# Patient Record
Sex: Male | Born: 1945 | Race: White | Hispanic: No | Marital: Married | Smoking: Former smoker
Health system: Southern US, Community
[De-identification: ages and names within clinical notes are randomized; demographics above are authoritative.]

## PROBLEM LIST (undated history)

## (undated) DIAGNOSIS — C678 Malignant neoplasm of overlapping sites of bladder: Secondary | ICD-10-CM

## (undated) DIAGNOSIS — E785 Hyperlipidemia, unspecified: Secondary | ICD-10-CM

## (undated) DIAGNOSIS — I82409 Acute embolism and thrombosis of unspecified deep veins of unspecified lower extremity: Secondary | ICD-10-CM

## (undated) DIAGNOSIS — R350 Frequency of micturition: Secondary | ICD-10-CM

## (undated) DIAGNOSIS — G5603 Carpal tunnel syndrome, bilateral upper limbs: Secondary | ICD-10-CM

## (undated) DIAGNOSIS — E7800 Pure hypercholesterolemia, unspecified: Secondary | ICD-10-CM

## (undated) DIAGNOSIS — R351 Nocturia: Secondary | ICD-10-CM

## (undated) DIAGNOSIS — M170 Bilateral primary osteoarthritis of knee: Secondary | ICD-10-CM

## (undated) DIAGNOSIS — M51369 Other intervertebral disc degeneration, lumbar region without mention of lumbar back pain or lower extremity pain: Secondary | ICD-10-CM

## (undated) DIAGNOSIS — I2699 Other pulmonary embolism without acute cor pulmonale: Secondary | ICD-10-CM

## (undated) DIAGNOSIS — K219 Gastro-esophageal reflux disease without esophagitis: Secondary | ICD-10-CM

## (undated) DIAGNOSIS — Z87828 Personal history of other (healed) physical injury and trauma: Secondary | ICD-10-CM

## (undated) DIAGNOSIS — E119 Type 2 diabetes mellitus without complications: Secondary | ICD-10-CM

## (undated) DIAGNOSIS — R911 Solitary pulmonary nodule: Secondary | ICD-10-CM

## (undated) DIAGNOSIS — I749 Embolism and thrombosis of unspecified artery: Secondary | ICD-10-CM

## (undated) DIAGNOSIS — M1990 Unspecified osteoarthritis, unspecified site: Secondary | ICD-10-CM

## (undated) DIAGNOSIS — M5136 Other intervertebral disc degeneration, lumbar region: Secondary | ICD-10-CM

## (undated) DIAGNOSIS — N3289 Other specified disorders of bladder: Secondary | ICD-10-CM

## (undated) DIAGNOSIS — Z9289 Personal history of other medical treatment: Secondary | ICD-10-CM

## (undated) DIAGNOSIS — I2510 Atherosclerotic heart disease of native coronary artery without angina pectoris: Secondary | ICD-10-CM

## (undated) DIAGNOSIS — C801 Malignant (primary) neoplasm, unspecified: Secondary | ICD-10-CM

## (undated) DIAGNOSIS — I10 Essential (primary) hypertension: Secondary | ICD-10-CM

## (undated) HISTORY — DX: Malignant neoplasm of overlapping sites of bladder: C67.8

## (undated) HISTORY — DX: Carpal tunnel syndrome, bilateral upper limbs: G56.03

## (undated) HISTORY — DX: Solitary pulmonary nodule: R91.1

## (undated) HISTORY — PX: TONSILLECTOMY: SUR1361

## (undated) HISTORY — DX: Atherosclerotic heart disease of native coronary artery without angina pectoris: I25.10

## (undated) HISTORY — PX: IVC FILTER PLACEMENT (ARMC HX): HXRAD1551

## (undated) HISTORY — DX: Other intervertebral disc degeneration, lumbar region: M51.36

## (undated) HISTORY — DX: Pure hypercholesterolemia, unspecified: E78.00

## (undated) HISTORY — DX: Other intervertebral disc degeneration, lumbar region without mention of lumbar back pain or lower extremity pain: M51.369

## (undated) HISTORY — DX: Essential (primary) hypertension: I10

## (undated) HISTORY — DX: Bilateral primary osteoarthritis of knee: M17.0

## (undated) HISTORY — PX: CARDIAC CATHETERIZATION: SHX172

---

## 1963-05-31 HISTORY — PX: OTHER SURGICAL HISTORY: SHX169

## 1991-05-31 HISTORY — PX: CATARACT EXTRACTION W/ INTRAOCULAR LENS  IMPLANT, BILATERAL: SHX1307

## 2009-05-30 HISTORY — PX: CARPAL TUNNEL RELEASE: SHX101

## 2009-05-30 HISTORY — PX: CHOLECYSTECTOMY: SHX55

## 2014-02-04 ENCOUNTER — Ambulatory Visit (INDEPENDENT_AMBULATORY_CARE_PROVIDER_SITE_OTHER): Payer: Medicare Other | Admitting: Podiatry

## 2014-02-04 ENCOUNTER — Ambulatory Visit (INDEPENDENT_AMBULATORY_CARE_PROVIDER_SITE_OTHER): Payer: Medicare Other

## 2014-02-04 ENCOUNTER — Encounter: Payer: Self-pay | Admitting: Podiatry

## 2014-02-04 VITALS — BP 145/78 | HR 60 | Resp 16 | Ht 72.0 in | Wt 190.0 lb

## 2014-02-04 DIAGNOSIS — M21619 Bunion of unspecified foot: Secondary | ICD-10-CM

## 2014-02-04 DIAGNOSIS — M2010 Hallux valgus (acquired), unspecified foot: Secondary | ICD-10-CM

## 2014-02-04 DIAGNOSIS — M79609 Pain in unspecified limb: Secondary | ICD-10-CM

## 2014-02-04 DIAGNOSIS — B351 Tinea unguium: Secondary | ICD-10-CM

## 2014-02-04 DIAGNOSIS — E119 Type 2 diabetes mellitus without complications: Secondary | ICD-10-CM

## 2014-02-04 NOTE — Progress Notes (Signed)
   Subjective:    Patient ID: Reymond Maynez, male    DOB: [birth year], 68 y.o.   MRN: [id number]  HPI Comments: Diabetic foot exam. No problems with the feet, diabetic for at least 15 years. The last a1c 7.   Diabetes      Review of Systems  Endocrine:       Diabetes   All other systems reviewed and are negative.      Objective:   Physical Exam: I have reviewed his past medical history medications allergies surgeries social history and review of systems. Pulses are strongly palpable bilateral. Neurologic sensorium is intact per since once the monofilament. Deep tendon reflexes are intact bilateral muscle strength is 5 over 5 dorsiflexors plantar flexors inverters everters all intrinsic musculature is intact. Orthopedic evaluation demonstrates all joints distal to the ankle a full range of motion without crepitation. Cavus foot deformity with hammertoe deformities noted. Cutaneous evaluation demonstrates thicker dystrophic nails possibly mycotic bilateral.        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  Plan: Debridement of nails 1 through 5 bilateral covered service secondary to pain.

## 2014-06-16 DIAGNOSIS — E119 Type 2 diabetes mellitus without complications: Secondary | ICD-10-CM | POA: Diagnosis not present

## 2014-06-19 DIAGNOSIS — E119 Type 2 diabetes mellitus without complications: Secondary | ICD-10-CM | POA: Diagnosis not present

## 2014-07-17 DIAGNOSIS — M79675 Pain in left toe(s): Secondary | ICD-10-CM | POA: Diagnosis not present

## 2014-07-17 DIAGNOSIS — L600 Ingrowing nail: Secondary | ICD-10-CM | POA: Diagnosis not present

## 2014-07-17 DIAGNOSIS — M2041 Other hammer toe(s) (acquired), right foot: Secondary | ICD-10-CM | POA: Diagnosis not present

## 2014-07-17 DIAGNOSIS — M79674 Pain in right toe(s): Secondary | ICD-10-CM | POA: Diagnosis not present

## 2014-09-30 DIAGNOSIS — M79675 Pain in left toe(s): Secondary | ICD-10-CM | POA: Diagnosis not present

## 2014-09-30 DIAGNOSIS — M2041 Other hammer toe(s) (acquired), right foot: Secondary | ICD-10-CM | POA: Diagnosis not present

## 2014-09-30 DIAGNOSIS — M79674 Pain in right toe(s): Secondary | ICD-10-CM | POA: Diagnosis not present

## 2014-09-30 DIAGNOSIS — L600 Ingrowing nail: Secondary | ICD-10-CM | POA: Diagnosis not present

## 2015-01-15 ENCOUNTER — Encounter: Payer: Self-pay | Admitting: Podiatry

## 2015-01-15 ENCOUNTER — Ambulatory Visit (INDEPENDENT_AMBULATORY_CARE_PROVIDER_SITE_OTHER): Payer: Medicare Other | Admitting: Podiatry

## 2015-01-15 DIAGNOSIS — B351 Tinea unguium: Secondary | ICD-10-CM

## 2015-01-15 DIAGNOSIS — E1342 Other specified diabetes mellitus with diabetic polyneuropathy: Secondary | ICD-10-CM

## 2015-01-15 DIAGNOSIS — G629 Polyneuropathy, unspecified: Secondary | ICD-10-CM

## 2015-01-15 DIAGNOSIS — E1142 Type 2 diabetes mellitus with diabetic polyneuropathy: Secondary | ICD-10-CM

## 2015-01-15 DIAGNOSIS — M79609 Pain in unspecified limb: Secondary | ICD-10-CM | POA: Diagnosis not present

## 2015-01-15 LAB — HM DIABETES FOOT EXAM

## 2015-01-15 NOTE — Progress Notes (Signed)
He presents today with a chief complaint of painful elongated toenails and he is concerned because he is a diabetic. He denies any changes in his past medical history medications allergies or social history. He denies any other troubles with his feet.  Objective: vital signs are stable he is alert and oriented 3. Pulses are strongly palpable. Nails are elongated and dystrophic. They're painful on palpation.  Assessment: pain in limb secondary to long painful thickened nails.  Plan: debrided nails 1 through 5 bilateral is a covered service secondary to pain  And diabetes. Follow-up with him in 3 months.

## 2015-03-03 DIAGNOSIS — L570 Actinic keratosis: Secondary | ICD-10-CM | POA: Diagnosis not present

## 2015-03-03 DIAGNOSIS — D692 Other nonthrombocytopenic purpura: Secondary | ICD-10-CM | POA: Diagnosis not present

## 2015-03-03 DIAGNOSIS — L219 Seborrheic dermatitis, unspecified: Secondary | ICD-10-CM | POA: Diagnosis not present

## 2015-03-11 DIAGNOSIS — Z23 Encounter for immunization: Secondary | ICD-10-CM | POA: Diagnosis not present

## 2015-04-08 ENCOUNTER — Ambulatory Visit: Payer: Medicare Other | Admitting: Podiatry

## 2015-04-09 ENCOUNTER — Ambulatory Visit (INDEPENDENT_AMBULATORY_CARE_PROVIDER_SITE_OTHER): Payer: Medicare Other | Admitting: Podiatry

## 2015-04-09 ENCOUNTER — Encounter: Payer: Self-pay | Admitting: Podiatry

## 2015-04-09 DIAGNOSIS — M79609 Pain in unspecified limb: Secondary | ICD-10-CM | POA: Diagnosis not present

## 2015-04-09 DIAGNOSIS — E1142 Type 2 diabetes mellitus with diabetic polyneuropathy: Secondary | ICD-10-CM | POA: Diagnosis not present

## 2015-04-09 DIAGNOSIS — B351 Tinea unguium: Secondary | ICD-10-CM

## 2015-04-09 NOTE — Progress Notes (Signed)
Patient ID: [name], male   DOB: August 05, 1945, 69 y.o.   MRN: [id number] Complaint:  Visit Type: Patient returns to my office for continued preventative foot care services. Complaint: Patient states" my nails have grown long and thick and become painful to walk and wear shoes" Patient has been diagnosed with DM with no foot complications. The patient presents for preventative foot care services. No changes to ROS  Podiatric Exam: Vascular: dorsalis pedis and posterior tibial pulses are palpable bilateral. Capillary return is immediate. Temperature gradient is WNL. Skin turgor WNL  Sensorium: Normal Semmes Weinstein monofilament test. Normal tactile sensation bilaterally. Nail Exam: Pt has thick disfigured discolored nails with subungual debris noted bilateral entire nail hallux through fifth toenails Ulcer Exam: There is no evidence of ulcer or pre-ulcerative changes or infection. Orthopedic Exam: Muscle tone and strength are WNL. No limitations in general ROM. No crepitus or effusions noted. Foot type and digits show no abnormalities. Bony prominences are unremarkable. Skin: No Porokeratosis. No infection or ulcers  Diagnosis:  Onychomycosis, , Pain in right toe, pain in left toes  Treatment & Plan Procedures and Treatment: Consent by patient was obtained for treatment procedures. The patient understood the discussion of treatment and procedures well. All questions were answered thoroughly reviewed. Debridement of mycotic and hypertrophic toenails, 1 through 5 bilateral and clearing of subungual debris. No ulceration, no infection noted.  Return Visit-Office Procedure: Patient instructed to return to the office for a follow up visit 3 months for continued evaluation and treatment.

## 2015-05-06 DIAGNOSIS — H11053 Peripheral pterygium, progressive, bilateral: Secondary | ICD-10-CM | POA: Diagnosis not present

## 2015-05-06 DIAGNOSIS — Z961 Presence of intraocular lens: Secondary | ICD-10-CM | POA: Diagnosis not present

## 2015-05-06 DIAGNOSIS — E119 Type 2 diabetes mellitus without complications: Secondary | ICD-10-CM | POA: Diagnosis not present

## 2015-05-06 DIAGNOSIS — Z7984 Long term (current) use of oral hypoglycemic drugs: Secondary | ICD-10-CM | POA: Diagnosis not present

## 2015-06-24 DIAGNOSIS — E119 Type 2 diabetes mellitus without complications: Secondary | ICD-10-CM | POA: Diagnosis not present

## 2015-06-24 DIAGNOSIS — Z125 Encounter for screening for malignant neoplasm of prostate: Secondary | ICD-10-CM | POA: Diagnosis not present

## 2015-07-01 DIAGNOSIS — L600 Ingrowing nail: Secondary | ICD-10-CM | POA: Diagnosis not present

## 2015-07-01 DIAGNOSIS — M79675 Pain in left toe(s): Secondary | ICD-10-CM | POA: Diagnosis not present

## 2015-07-01 DIAGNOSIS — M2041 Other hammer toe(s) (acquired), right foot: Secondary | ICD-10-CM | POA: Diagnosis not present

## 2015-07-01 DIAGNOSIS — M79674 Pain in right toe(s): Secondary | ICD-10-CM | POA: Diagnosis not present

## 2015-07-02 DIAGNOSIS — Z1212 Encounter for screening for malignant neoplasm of rectum: Secondary | ICD-10-CM | POA: Diagnosis not present

## 2015-07-16 DIAGNOSIS — M5416 Radiculopathy, lumbar region: Secondary | ICD-10-CM | POA: Diagnosis not present

## 2015-07-16 DIAGNOSIS — E785 Hyperlipidemia, unspecified: Secondary | ICD-10-CM | POA: Diagnosis not present

## 2015-07-16 DIAGNOSIS — E119 Type 2 diabetes mellitus without complications: Secondary | ICD-10-CM | POA: Diagnosis not present

## 2015-07-16 DIAGNOSIS — R310 Gross hematuria: Secondary | ICD-10-CM | POA: Diagnosis not present

## 2015-07-16 DIAGNOSIS — Z125 Encounter for screening for malignant neoplasm of prostate: Secondary | ICD-10-CM | POA: Diagnosis not present

## 2015-07-16 DIAGNOSIS — Z0000 Encounter for general adult medical examination without abnormal findings: Secondary | ICD-10-CM | POA: Diagnosis not present

## 2015-07-22 DIAGNOSIS — R310 Gross hematuria: Secondary | ICD-10-CM | POA: Diagnosis not present

## 2015-07-30 DIAGNOSIS — C678 Malignant neoplasm of overlapping sites of bladder: Secondary | ICD-10-CM | POA: Diagnosis not present

## 2015-07-30 DIAGNOSIS — R911 Solitary pulmonary nodule: Secondary | ICD-10-CM | POA: Diagnosis not present

## 2015-08-14 DIAGNOSIS — Z0000 Encounter for general adult medical examination without abnormal findings: Secondary | ICD-10-CM | POA: Diagnosis not present

## 2015-08-14 DIAGNOSIS — N3289 Other specified disorders of bladder: Secondary | ICD-10-CM | POA: Diagnosis not present

## 2015-08-14 DIAGNOSIS — R310 Gross hematuria: Secondary | ICD-10-CM | POA: Diagnosis not present

## 2015-08-19 ENCOUNTER — Other Ambulatory Visit: Payer: Self-pay | Admitting: Urology

## 2015-08-24 ENCOUNTER — Encounter (HOSPITAL_BASED_OUTPATIENT_CLINIC_OR_DEPARTMENT_OTHER): Payer: Self-pay | Admitting: *Deleted

## 2015-08-24 NOTE — Progress Notes (Signed)
NPO AFTER MN.  ARRIVE AT 0700.  NEEDS ISTAT AND EKG.  WILL TAKE PRILOSEC AM DOS W/ SIPS OF WATER.

## 2015-08-27 ENCOUNTER — Ambulatory Visit (HOSPITAL_BASED_OUTPATIENT_CLINIC_OR_DEPARTMENT_OTHER)
Admission: RE | Admit: 2015-08-27 | Discharge: 2015-08-27 | Disposition: A | Discharge status: Home / Self Care | Payer: Medicare Other | Source: Ambulatory Visit | Attending: Urology | Admitting: Urology

## 2015-08-27 ENCOUNTER — Encounter (HOSPITAL_BASED_OUTPATIENT_CLINIC_OR_DEPARTMENT_OTHER)
Admission: RE | Disposition: A | Discharge status: Home / Self Care | Payer: Self-pay | Source: Ambulatory Visit | Attending: Urology

## 2015-08-27 ENCOUNTER — Encounter (HOSPITAL_BASED_OUTPATIENT_CLINIC_OR_DEPARTMENT_OTHER): Payer: Self-pay

## 2015-08-27 ENCOUNTER — Ambulatory Visit (HOSPITAL_BASED_OUTPATIENT_CLINIC_OR_DEPARTMENT_OTHER): Payer: Medicare Other | Admitting: Anesthesiology

## 2015-08-27 DIAGNOSIS — Z79899 Other long term (current) drug therapy: Secondary | ICD-10-CM | POA: Insufficient documentation

## 2015-08-27 DIAGNOSIS — N3289 Other specified disorders of bladder: Secondary | ICD-10-CM | POA: Diagnosis not present

## 2015-08-27 DIAGNOSIS — K219 Gastro-esophageal reflux disease without esophagitis: Secondary | ICD-10-CM | POA: Diagnosis not present

## 2015-08-27 DIAGNOSIS — Z7984 Long term (current) use of oral hypoglycemic drugs: Secondary | ICD-10-CM | POA: Insufficient documentation

## 2015-08-27 DIAGNOSIS — C679 Malignant neoplasm of bladder, unspecified: Secondary | ICD-10-CM | POA: Diagnosis not present

## 2015-08-27 DIAGNOSIS — D494 Neoplasm of unspecified behavior of bladder: Secondary | ICD-10-CM | POA: Diagnosis not present

## 2015-08-27 DIAGNOSIS — E119 Type 2 diabetes mellitus without complications: Secondary | ICD-10-CM | POA: Diagnosis not present

## 2015-08-27 DIAGNOSIS — E785 Hyperlipidemia, unspecified: Secondary | ICD-10-CM | POA: Diagnosis not present

## 2015-08-27 DIAGNOSIS — N329 Bladder disorder, unspecified: Secondary | ICD-10-CM | POA: Diagnosis present

## 2015-08-27 DIAGNOSIS — C678 Malignant neoplasm of overlapping sites of bladder: Secondary | ICD-10-CM | POA: Insufficient documentation

## 2015-08-27 HISTORY — PX: CYSTOSCOPY W/ RETROGRADES: SHX1426

## 2015-08-27 HISTORY — DX: Type 2 diabetes mellitus without complications: E11.9

## 2015-08-27 HISTORY — DX: Gastro-esophageal reflux disease without esophagitis: K21.9

## 2015-08-27 HISTORY — DX: Hyperlipidemia, unspecified: E78.5

## 2015-08-27 HISTORY — DX: Nocturia: R35.1

## 2015-08-27 HISTORY — DX: Frequency of micturition: R35.0

## 2015-08-27 HISTORY — DX: Other specified disorders of bladder: N32.89

## 2015-08-27 HISTORY — PX: TRANSURETHRAL RESECTION OF BLADDER TUMOR WITH GYRUS (TURBT-GYRUS): SHX6458

## 2015-08-27 HISTORY — DX: Personal history of other (healed) physical injury and trauma: Z87.828

## 2015-08-27 LAB — POCT I-STAT 4, (NA,K, GLUC, HGB,HCT)
GLUCOSE: 238 mg/dL — AB (ref 65–99)
HEMATOCRIT: 43 % (ref 39.0–52.0)
Hemoglobin: 14.6 g/dL (ref 13.0–17.0)
POTASSIUM: 4.6 mmol/L (ref 3.5–5.1)
Sodium: 137 mmol/L (ref 135–145)

## 2015-08-27 LAB — GLUCOSE, CAPILLARY: Glucose-Capillary: 209 mg/dL — ABNORMAL HIGH (ref 65–99)

## 2015-08-27 SURGERY — TRANSURETHRAL RESECTION OF BLADDER TUMOR WITH GYRUS (TURBT-GYRUS)
Anesthesia: General | Site: Ureter

## 2015-08-27 MED ORDER — GLYCOPYRROLATE 0.2 MG/ML IJ SOLN
INTRAMUSCULAR | Status: AC
  Filled 2015-08-27: qty 2

## 2015-08-27 MED ORDER — NEOSTIGMINE METHYLSULFATE 10 MG/10ML IV SOLN
INTRAVENOUS | Status: AC
  Filled 2015-08-27: qty 1

## 2015-08-27 MED ORDER — PROPOFOL 500 MG/50ML IV EMUL
INTRAVENOUS | Status: AC
  Filled 2015-08-27: qty 50

## 2015-08-27 MED ORDER — TRAMADOL HCL 50 MG PO TABS: 50.0000 mg | ORAL_TABLET | Freq: Four times a day (QID) | ORAL | Status: DC | PRN

## 2015-08-27 MED ORDER — FENTANYL CITRATE (PF) 100 MCG/2ML IJ SOLN
INTRAMUSCULAR | Status: DC | PRN
  Administered 2015-08-27 (×2): 50 ug via INTRAVENOUS
  Administered 2015-08-27: 100 ug via INTRAVENOUS

## 2015-08-27 MED ORDER — EPHEDRINE SULFATE 50 MG/ML IJ SOLN
INTRAMUSCULAR | Status: AC
  Filled 2015-08-27: qty 1

## 2015-08-27 MED ORDER — ROCURONIUM BROMIDE 100 MG/10ML IV SOLN
INTRAVENOUS | Status: DC | PRN
  Administered 2015-08-27: 30 mg via INTRAVENOUS
  Administered 2015-08-27: 20 mg via INTRAVENOUS
  Administered 2015-08-27: 5 mg via INTRAVENOUS

## 2015-08-27 MED ORDER — ONDANSETRON HCL 4 MG/2ML IJ SOLN
INTRAMUSCULAR | Status: AC
  Filled 2015-08-27: qty 2

## 2015-08-27 MED ORDER — LIDOCAINE HCL 2 % EX GEL
CUTANEOUS | Status: AC
  Filled 2015-08-27: qty 5

## 2015-08-27 MED ORDER — PHENAZOPYRIDINE HCL 200 MG PO TABS: 200.0000 mg | ORAL_TABLET | Freq: Three times a day (TID) | ORAL | Status: DC | PRN

## 2015-08-27 MED ORDER — HYDROMORPHONE HCL 1 MG/ML IJ SOLN
0.2500 mg | INTRAMUSCULAR | Status: DC | PRN
  Filled 2015-08-27: qty 1

## 2015-08-27 MED ORDER — CIPROFLOXACIN IN D5W 400 MG/200ML IV SOLN
INTRAVENOUS | Status: AC
  Filled 2015-08-27: qty 200

## 2015-08-27 MED ORDER — BELLADONNA ALKALOIDS-OPIUM 16.2-60 MG RE SUPP
RECTAL | Status: AC
  Filled 2015-08-27: qty 1

## 2015-08-27 MED ORDER — CIPROFLOXACIN IN D5W 400 MG/200ML IV SOLN
400.0000 mg | INTRAVENOUS | Status: AC
  Administered 2015-08-27: 400 mg via INTRAVENOUS
  Filled 2015-08-27: qty 200

## 2015-08-27 MED ORDER — PROPOFOL 10 MG/ML IV BOLUS
INTRAVENOUS | Status: DC | PRN
  Administered 2015-08-27: 150 mg via INTRAVENOUS
  Administered 2015-08-27: 50 mg via INTRAVENOUS

## 2015-08-27 MED ORDER — EPHEDRINE SULFATE 50 MG/ML IJ SOLN
INTRAMUSCULAR | Status: DC | PRN
  Administered 2015-08-27 (×2): 10 mg via INTRAVENOUS

## 2015-08-27 MED ORDER — TRAMADOL HCL 50 MG PO TABS
50.0000 mg | ORAL_TABLET | Freq: Once | ORAL | Status: AC
  Administered 2015-08-27: 50 mg via ORAL
  Filled 2015-08-27: qty 1

## 2015-08-27 MED ORDER — TRAMADOL HCL 50 MG PO TABS
ORAL_TABLET | ORAL | Status: AC
  Filled 2015-08-27: qty 1

## 2015-08-27 MED ORDER — FUROSEMIDE 10 MG/ML IJ SOLN
INTRAMUSCULAR | Status: AC
  Filled 2015-08-27: qty 4

## 2015-08-27 MED ORDER — SODIUM CHLORIDE 0.9 % IR SOLN
Status: DC | PRN
  Administered 2015-08-27 (×2): 2000 mL
  Administered 2015-08-27 (×5): 1000 mL
  Administered 2015-08-27: 3000 mL
  Administered 2015-08-27: 1000 mL
  Administered 2015-08-27: 2000 mL
  Administered 2015-08-27 (×6): 1000 mL
  Administered 2015-08-27: 3000 mL
  Administered 2015-08-27: 2000 mL
  Administered 2015-08-27: 3000 mL
  Administered 2015-08-27: 2000 mL
  Administered 2015-08-27: 1
  Administered 2015-08-27: 1000 mL
  Administered 2015-08-27: 3000 mL
  Administered 2015-08-27 (×4): 1000 mL
  Administered 2015-08-27: 2000 mL
  Administered 2015-08-27: 1000 mL

## 2015-08-27 MED ORDER — FUROSEMIDE 10 MG/ML IJ SOLN
40.0000 mg | Freq: Once | INTRAMUSCULAR | Status: AC
  Administered 2015-08-27: 40 mg via INTRAVENOUS
  Filled 2015-08-27: qty 4

## 2015-08-27 MED ORDER — GLYCOPYRROLATE 0.2 MG/ML IJ SOLN
INTRAMUSCULAR | Status: DC | PRN
  Administered 2015-08-27: 0.6 mg via INTRAVENOUS

## 2015-08-27 MED ORDER — LACTATED RINGERS IV SOLN
INTRAVENOUS | Status: DC
Start: 2015-08-27 — End: 2015-08-27
  Administered 2015-08-27 (×3): via INTRAVENOUS
  Filled 2015-08-27: qty 1000

## 2015-08-27 MED ORDER — NEOSTIGMINE METHYLSULFATE 10 MG/10ML IV SOLN
INTRAVENOUS | Status: DC | PRN
  Administered 2015-08-27: 4 mg via INTRAVENOUS

## 2015-08-27 MED ORDER — PHENAZOPYRIDINE HCL 100 MG PO TABS
ORAL_TABLET | ORAL | Status: AC
  Filled 2015-08-27: qty 2

## 2015-08-27 MED ORDER — GLYCOPYRROLATE 0.2 MG/ML IJ SOLN
INTRAMUSCULAR | Status: AC
  Filled 2015-08-27: qty 1

## 2015-08-27 MED ORDER — LIDOCAINE HCL 2 % EX GEL
CUTANEOUS | Status: DC | PRN
  Administered 2015-08-27: 1 via URETHRAL

## 2015-08-27 MED ORDER — PROMETHAZINE HCL 25 MG/ML IJ SOLN
6.2500 mg | INTRAMUSCULAR | Status: DC | PRN
  Filled 2015-08-27: qty 1

## 2015-08-27 MED ORDER — IOPAMIDOL (ISOVUE-370) INJECTION 76%
INTRAVENOUS | Status: DC | PRN
  Administered 2015-08-27: 20 mL

## 2015-08-27 MED ORDER — LIDOCAINE HCL (CARDIAC) 20 MG/ML IV SOLN
INTRAVENOUS | Status: DC | PRN
  Administered 2015-08-27: 60 mg via INTRAVENOUS

## 2015-08-27 MED ORDER — PROPOFOL 10 MG/ML IV BOLUS
INTRAVENOUS | Status: AC
  Filled 2015-08-27: qty 20

## 2015-08-27 MED ORDER — FENTANYL CITRATE (PF) 250 MCG/5ML IJ SOLN
INTRAMUSCULAR | Status: AC
  Filled 2015-08-27: qty 5

## 2015-08-27 MED ORDER — ONDANSETRON HCL 4 MG/2ML IJ SOLN
INTRAMUSCULAR | Status: DC | PRN
  Administered 2015-08-27: 4 mg via INTRAVENOUS

## 2015-08-27 MED ORDER — BELLADONNA ALKALOIDS-OPIUM 16.2-60 MG RE SUPP
RECTAL | Status: DC | PRN
  Administered 2015-08-27: 1 via RECTAL

## 2015-08-27 MED ORDER — PHENAZOPYRIDINE HCL 200 MG PO TABS
200.0000 mg | ORAL_TABLET | Freq: Once | ORAL | Status: AC
  Administered 2015-08-27: 200 mg via ORAL
  Filled 2015-08-27: qty 1

## 2015-08-27 MED FILL — traMADol HCL 50 MG TABS: 50 | 4 days supply | Qty: 30 | Fill #0

## 2015-08-27 MED FILL — PHENAZOPYRIDINE 200 MG TAB: 200 | 3 days supply | Qty: 10 | Fill #0

## 2015-08-27 SURGICAL SUPPLY — 38 items
ADAPTER CATH URET PLST 4-6FR (CATHETERS) IMPLANT
BAG DRAIN URO-CYSTO SKYTR STRL (DRAIN) ×4 IMPLANT
BAG URINE DRAINAGE (UROLOGICAL SUPPLIES) ×4 IMPLANT
BLADE SURG 15 STRL LF DISP TIS (BLADE) IMPLANT
BLADE SURG 15 STRL SS (BLADE)
CATH FOLEY 3WAY 20FR (CATHETERS) ×4 IMPLANT
CATH FOLEY 3WAY 30CC 22FR (CATHETERS) ×4 IMPLANT
CATH INTERMIT  6FR 70CM (CATHETERS) IMPLANT
CATH URET 5FR 28IN CONE TIP (BALLOONS)
CATH URET 5FR 28IN OPEN ENDED (CATHETERS) ×4 IMPLANT
CATH URET 5FR 70CM CONE TIP (BALLOONS) IMPLANT
CATH URET DUAL LUMEN 6-10FR 50 (CATHETERS) IMPLANT
CLOTH BEACON ORANGE TIMEOUT ST (SAFETY) ×4 IMPLANT
DRSG TEGADERM 2-3/8X2-3/4 SM (GAUZE/BANDAGES/DRESSINGS) IMPLANT
ELECT REM PT RETURN 9FT ADLT (ELECTROSURGICAL)
ELECTRODE REM PT RTRN 9FT ADLT (ELECTROSURGICAL) IMPLANT
EVACUATOR MICROVAS BLADDER (UROLOGICAL SUPPLIES) IMPLANT
GLOVE BIO SURGEON STRL SZ7.5 (GLOVE) ×4 IMPLANT
GLOVE SURG SS PI 7.5 STRL IVOR (GLOVE) ×8 IMPLANT
GOWN STRL REUS W/ TWL XL LVL3 (GOWN DISPOSABLE) ×2 IMPLANT
GOWN STRL REUS W/TWL XL LVL3 (GOWN DISPOSABLE) ×6 IMPLANT
GUIDEWIRE 0.038 PTFE COATED (WIRE) IMPLANT
GUIDEWIRE ANG ZIPWIRE 038X150 (WIRE) IMPLANT
GUIDEWIRE STR DUAL SENSOR (WIRE) ×4 IMPLANT
HOLDER FOLEY CATH W/STRAP (MISCELLANEOUS) ×4 IMPLANT
IV NS 1000ML (IV SOLUTION) ×62
IV NS 1000ML BAXH (IV SOLUTION) ×62 IMPLANT
IV NS IRRIG 3000ML ARTHROMATIC (IV SOLUTION) ×16 IMPLANT
KIT ROOM TURNOVER WOR (KITS) ×4 IMPLANT
LOOP CUT BIPOLAR 24F LRG (ELECTROSURGICAL) ×4 IMPLANT
MANIFOLD NEPTUNE II (INSTRUMENTS) ×4 IMPLANT
NS IRRIG 500ML POUR BTL (IV SOLUTION) ×4 IMPLANT
PACK CYSTO (CUSTOM PROCEDURE TRAY) ×4 IMPLANT
SUT ETHILON 3 0 PS 1 (SUTURE) IMPLANT
SYR 30ML LL (SYRINGE) IMPLANT
TUBE CONNECTING 12'X1/4 (SUCTIONS) ×1
TUBE CONNECTING 12X1/4 (SUCTIONS) ×3 IMPLANT
TUBE FEEDING 8FR 16IN STR KANG (MISCELLANEOUS) IMPLANT

## 2015-08-27 NOTE — Op Note (Signed)
Preoperative diagnosis:  1. 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 same   Procedure: 1. Cystoscopy, retrograde pyelogram bilaterally with interpretation 2. TURBT > 5cm  Surgeon: Ardis Hughs, MD  Anesthesia: General  Complications: None  Intraoperative findings:  #1: Retrograde pyelogram is performed using 10 mL of Isovue contrast. Retro-pyelogram stem straight and normal coronary ureter throughout with no hydronephrosis or evidence of filling defects. The calyces were sharp bilaterally. #2: The patient had at least 6 large papillary type bladder tumors on relatively narrow stalks, low-grade appearing. There was numerous smaller satellite lesions as well. -The largest tumors were on the anterior bladder dome. The largest one was 4 cm which was approximately midline. Second largest tumor was approximately 2.5 cm and was located on the right anterior wall. There was a third lesion approximately 1 cm on the posterior left dome. There was 2 lesions on the left lateral wall with some satellite lesions. The largest when here was likely 2 cm in size. There were also 2 lesions on the posterior wall/trigonal region with the largest tumor here measuring approximately 2 cm.  EBL: Minimal  Specimens: Bladder tumors  Indication: [name] is a 70 y.o. patient with painless gross hematuria and filling defects noted on his CT scan, hematuria protocol. Cystoscopy then confirmed the presence of multiple large papillary-like lesions.  After reviewing the management options for treatment, he elected to proceed with the above surgical procedure(s). We have discussed the potential benefits and risks of the procedure, side effects of the proposed treatment, the likelihood of the patient achieving the goals of the procedure, and any potential problems that might occur during the procedure or recuperation. Informed consent has been  obtained.  Description of procedure:  The patient was taken to the operating room and general anesthesia was induced.  The patient was placed in the dorsal lithotomy position, prepped and draped in the usual sterile fashion, and preoperative antibiotics were administered. A preoperative time-out was performed.   A 21 French 30 cystoscope was gently passed through the patient's urethra and into the bladder. The bladder was then emptied and 30 scope was exchanged for the 70 lens and a 360 cystoscopic evaluation was performed. The above findings were noted with all tumors detailed. I then reintroduced the 30 lens and performed retrograde pyelograms using a 5 French open-ended ureteral catheter and 10 mL of Isovue per collecting system. The above findings were noted. I then removed the 21 French sheath and reintroduced a 26 French sheath using the visual obturator and the 30 lens. I then exchanged the visual obturator for a loop resectoscope. I then carefully resected the bladder tumors as described above. Hemostasis was noted to be excellent at the end of the case. I did irrigate the bladder with water at the end of the case. I placed a 20 French three-way Foley catheter at the end of the case. I also put a B and O suppository into the patient's rectum. He was subsequently extubated and returned to the PACU in stable condition.  Ardis Hughs, M.D.

## 2015-08-27 NOTE — H&P (Signed)
Reason For Visit bladder tumors   History of Present Illness 19M seen today for bladder tumors. He was in [place] where he presented to his PCP with painless gross hematuria. The patient then underwent a CT-urogram which demonstrated 7 separate lesions that enhance within his bladder, the largest one was 17x82mm. There was no hydronephrosis or evidence for extension outside the bladder, no mention of lymphadenopathy. There was also a 79mm non-calcified nodule within the right lower lobe of his lung. His BUN/Cr were 15/1.1 (06/24/15). In [place] he was seen by a urologist who recommended that he proceed to the OR for TURBT. The patient elected to return to [facility] for his treatment. He did not undergo office cystoscopy nor have any follow-up for his lung nodule.  The patient lives part-time in [place], and has a house here. As such, he is opted to have his treatments performed here in [place]. The patient has not had any more ongoing gross hematuria. He denies any dysuria. He has had some urinary frequency which has progressed recently. The patient denies any flank pain or patches of kidney stones. He has no history of recurrent urinary tract infections. He is a nonsmoker, has a 25-pack-year history. Patient has no history of cancer. He has no urologic history of surgery or progressive voiding symptoms.   Past Medical History Problems  1. History of diabetes mellitus (Z86.39) 2. History of heartburn (Z87.898) 3. History of hyperlipidemia (Z86.39)  Surgical History Problems  1. History of Abdominal Surgery 2. History of Cataract Surgery 3. History of Gallbladder Surgery 4. History of Hand Repair  Current Meds 1. Glimepiride TABS;  Therapy: (Recorded:17Mar2017) to Recorded 2. MetFORMIN HCl TABS;  Therapy: (Recorded:17Mar2017) to Recorded 3. Simvastatin 10 MG Oral Tablet;  Therapy: (Recorded:17Mar2017) to Recorded  Allergies Medication  1. Penicillins  Family History Problems  1. Family  history of Death of family member : Mother, Father 2. Family history of heart failure (Z82.49) : Mother, Father  Social History Problems    Alcohol use (Z78.9)   2   Caffeine use (F15.90)   3   Married   Never a smoker   Number of children   1 son and 1 daughter   Retired  Review of Systems  Genitourinary: urinary frequency, nocturia, hematuria and erectile dysfunction.  Gastrointestinal: heartburn and constipation.  Integumentary: pruritus.  Hematologic/Lymphatic: a tendency to easily bruise.    Vitals Vital Signs [Data Includes: Last 1 Day]  Recorded: [id number] 12:53PM  Height: 6 ft  Weight: 195 lb  BMI Calculated: 26.45 BSA Calculated: 2.11 Blood Pressure: 174 / 79 Temperature: 97.4 F Heart Rate: 69  Physical Exam Constitutional: Well nourished and well developed . No acute distress.  ENT:. The ears and nose are normal in appearance.  Neck: The appearance of the neck is normal and no neck mass is present.  Pulmonary: No respiratory distress and normal respiratory rhythm and effort.  Cardiovascular: Heart rate and rhythm are normal . No peripheral edema.  Abdomen: The abdomen is soft and nontender. No masses are palpated. No CVA tenderness. No hernias are palpable. No hepatosplenomegaly noted.  Genitourinary: Examination of the penis demonstrates no discharge, no masses, no lesions and a normal meatus. The scrotum is without lesions. The right epididymis is palpably normal and non-tender. The left epididymis is palpably normal and non-tender. The right testis is non-tender and without masses. The left testis is non-tender and without masses.  Lymphatics: The femoral and inguinal nodes are not enlarged or tender.  Skin: Normal  skin turgor, no visible rash and no visible skin lesions.  Neuro/Psych:. Mood and affect are appropriate.  Rectal: The prostate exam was deferred.    Results/Data Urine [Data Includes: Last 1 Day]   [id number]  COLOR AMBER   APPEARANCE  CLOUDY   SPECIFIC GRAVITY 1.025   pH 5.0   GLUCOSE 1+   BILIRUBIN NEGATIVE   KETONE NEGATIVE   BLOOD 3+   PROTEIN TRACE   NITRITE NEGATIVE   LEUKOCYTE ESTERASE NEGATIVE   SQUAMOUS EPITHELIAL/HPF 0-5 HPF  WBC 0-5 WBC/HPF  RBC >60 RBC/HPF  BACTERIA NONE SEEN HPF  CRYSTALS NONE SEEN HPF  CASTS NONE SEEN LPF  Yeast NONE SEEN HPF   I independently reviewed the patient's CT scan demonstrating some enhancing masses in the bladder without extension into the bladder wall or hydronephrosis. There is no lymphadenopathy. There are no other additional findings. There was an incidental finding of a 6 m nodule in the lung which we will follow-up. M   Procedure  Procedure: Cystoscopy   Indication: Bladder Mass.  Informed Consent: from the patient . Specific risks including, but not limited to bleeding, infection, pain, allergic reaction etc. were explained.  Prep: The patient was prepped with hibiclens.  Anesthesia:. Local anesthesia was administered intraurethrally with 2% lidocaine jelly.  Antibiotic prophylaxis: Ciprofloxacin.  Procedure Note:  Urethral meatus:. No abnormalities.  Anterior urethra: No abnormalities.  Prostatic urethra: No abnormalities.  Bladder: Visulization was clear. The ureteral orifices were in the normal anatomic position bilaterally. Multiple tumors were identified in the bladder. A sessile tumor was seen in the bladder measuring approximately 2cm cm in size. This tumor was located on the anterior aspect, near the dome of the bladder.    Assessment Assessed  1. Bladder mass (N32.89)  Plan  Bladder mass  1. URINE CULTURE; Status:In Progress - Specimen/Data Collected;   Done: [id number] Health Maintenance  2. UA With REFLEX; [Do Not Release]; Status:Complete;   [id number] 12:39PM  Health Maintenance (V70.0) (Z00.00)   Discussion/Summary The patient has multiple small bladder tumors in his bladder. They appear low-grade on cystoscopy. I went over the  findings with the patient. I recommended that we proceed to the operating room for transurethral resection of his bladder tumors. At the same time, we would also perform retrograde pyelograms. Once we get the patient's final pathology back and then decide on whether or not to perform adjuvant BCG therapy. I discussed the risk and benefits of the operation with the patient in great detail. This will be an outpatient procedure. We did plan to send the patient home without a Foley catheter, however given the size and location of his tumors he may require a catheter for 1 week.

## 2015-08-27 NOTE — Anesthesia Postprocedure Evaluation (Signed)
Anesthesia Post Note  Patient: [name]  Procedure(s) Performed: Procedure(s) (LRB): TRANSURETHRAL RESECTION OF BLADDER TUMOR WITH GYRUS (TURBT-GYRUS) (N/A) CYSTOSCOPY WITH RETROGRADE PYELOGRAM (Bilateral)  Patient location during evaluation: PACU Anesthesia Type: General Level of consciousness: awake Pain management: pain level controlled Vital Signs Assessment: post-procedure vital signs reviewed and stable Respiratory status: spontaneous breathing Cardiovascular status: stable Anesthetic complications: no    Last Vitals:  Filed Vitals:   08/27/15 1300 08/27/15 1315  BP: 146/90 155/88  Pulse: 75 64  Temp:    Resp: 19 14    Last Pain: There were no vitals filed for this visit.               EDWARDS,Dayvin Aber

## 2015-08-27 NOTE — Anesthesia Procedure Notes (Signed)
Date/Time: 08/27/2015 8:54 AM Performed by: Rayvon Char Pre-anesthesia Checklist: Patient identified, Emergency Drugs available, Suction available, Patient being monitored and Timeout performed Patient Re-evaluated:Patient Re-evaluated prior to inductionOxygen Delivery Method: Circle system utilized Preoxygenation: Pre-oxygenation with 100% oxygen Intubation Type: IV induction Ventilation: Mask ventilation without difficulty Grade View: Grade IV Tube type: Oral Number of attempts: 2 Airway Equipment and Method: Video-laryngoscopy Placement Confirmation: ETT inserted through vocal cords under direct vision,  positive ETCO2 and breath sounds checked- equal and bilateral Secured at: 20 cm Dental Injury: Teeth and Oropharynx as per pre-operative assessment  Difficulty Due To: Difficult Airway- due to limited oral opening

## 2015-08-27 NOTE — Transfer of Care (Signed)
Immediate Anesthesia Transfer of Care Note  Patient: ANATOLIY STOCKERT  Procedure(s) Performed: Procedure(s): TRANSURETHRAL RESECTION OF BLADDER TUMOR WITH GYRUS (TURBT-GYRUS) (N/A) CYSTOSCOPY WITH RETROGRADE PYELOGRAM (Bilateral)  Patient Location: PACU  Anesthesia Type:General  Level of Consciousness: awake, alert  and oriented  Airway & Oxygen Therapy: Patient Spontanous Breathing and Patient connected to nasal cannula oxygen  Post-op Assessment: Report given to RN and Post -op Vital signs reviewed and stable  Post vital signs: Reviewed and stable  Last Vitals:  Filed Vitals:   08/27/15 0704  BP: 182/74  Pulse: 58  Temp: 36.4 C  Resp: 16    Complications: No apparent anesthesia complications

## 2015-08-27 NOTE — Anesthesia Preprocedure Evaluation (Addendum)
Anesthesia Evaluation  Patient identified by MRN, date of birth, ID band Patient awake    Reviewed: Allergy & Precautions, NPO status , Patient's Chart, lab work & pertinent test results  Airway Mallampati: II  TM Distance: >3 FB Neck ROM: Full    Dental   Pulmonary former smoker,    breath sounds clear to auscultation       Cardiovascular negative cardio ROS   Rhythm:Regular Rate:Normal     Neuro/Psych    GI/Hepatic Neg liver ROS, GERD  ,  Endo/Other  diabetes  Renal/GU negative Renal ROS     Musculoskeletal   Abdominal   Peds  Hematology   Anesthesia Other Findings   Reproductive/Obstetrics                           Anesthesia Physical Anesthesia Plan  ASA: III  Anesthesia Plan: General   Post-op Pain Management:    Induction: Intravenous  Airway Management Planned: Oral ETT  Additional Equipment:   Intra-op Plan:   Post-operative Plan: Extubation in OR  Informed Consent: I have reviewed the patients History and Physical, chart, labs and discussed the procedure including the risks, benefits and alternatives for the proposed anesthesia with the patient or authorized representative who has indicated his/her understanding and acceptance.   Dental advisory given  Plan Discussed with: CRNA and Anesthesiologist  Anesthesia Plan Comments:         Anesthesia Quick Evaluation

## 2015-08-27 NOTE — Discharge Instructions (Signed)
Transurethral Resection of Bladder Tumor (TURBT) or Bladder Biopsy   Definition:  Transurethral Resection of the Bladder Tumor is a surgical procedure used to diagnose and remove tumors within the bladder. TURBT is the most common treatment for early stage bladder cancer.  General instructions:     Your recent bladder surgery requires very little post hospital care but some definite precautions.  Despite the fact that no skin incisions were used, the area around the bladder incisions are raw and covered with scabs to promote healing and prevent bleeding. Certain precautions are needed to insure that the scabs are not disturbed over the next 2-4 weeks while the healing proceeds.  Because the raw surface inside your bladder and the irritating effects of urine you may expect frequency of urination and/or urgency (a stronger desire to urinate) and perhaps even getting up at night more often. This will usually resolve or improve slowly over the healing period. You may see some blood in your urine over the first 6 weeks. Do not be alarmed, even if the urine was clear for a while. Get off your feet and drink lots of fluids until clearing occurs. If you start to pass clots or don't improve call us.  Diet:  You may return to your normal diet immediately. Because of the raw surface of your bladder, alcohol, spicy foods, foods high in acid and drinks with caffeine may cause irritation or frequency and should be used in moderation. To keep your urine flowing freely and avoid constipation, drink plenty of fluids during the day (8-10 glasses). Tip: Avoid cranberry juice because it is very acidic.  Activity:  Your physical activity doesn't need to be restricted. However, if you are very active, you may see some blood in the urine. We suggest that you reduce your activity under the circumstances until the bleeding has stopped.  Bowels:  It is important to keep your bowels regular during the postoperative  period. Straining with bowel movements can cause bleeding. A bowel movement every other day is reasonable. Use a mild laxative if needed, such as milk of magnesia 2-3 tablespoons, or 2 Dulcolax tablets. Call if you continue to have problems. If you had been taking narcotics for pain, before, during or after your surgery, you may be constipated. Take a laxative if necessary.    Medication:  You should resume your pre-surgery medications unless told not to. In addition you may be given an antibiotic to prevent or treat infection. Antibiotics are not always necessary. All medication should be taken as prescribed until the bottles are finished unless you are having an unusual reaction to one of the drugs.  CYSTOSCOPY HOME CARE INSTRUCTIONS  Activity: Rest for the remainder of the day.  Do not drive or operate equipment today.  You may resume normal activities in one to two days as instructed by your physician.   Meals: Drink plenty of liquids and eat light foods such as gelatin or soup this evening.  You may return to a normal meal plan tomorrow.  Return to Work: You may return to work in one to two days or as instructed by your physician.  Special Instructions / Symptoms: Call your physician if any of these symptoms occur:   -persistent or heavy bleeding  -bleeding which continues after first few urination  -large blood clots that are difficult to pass  -urine stream diminishes or stops completely  -fever equal to or higher than 101 degrees Farenheit.  -cloudy urine with a strong, foul odor  -  severe pain  Females should always wipe from front to back after elimination.  You may feel some burning pain when you urinate.  This should disappear with time.  Applying moist heat to the lower abdomen or a hot tub bath may help relieve the pain. \  Follow-Up / Date of Return Visit to Your Physician:  As instructed Call for an appointment to arrange follow-up.  Patient Signature:   ________________________________________________________  Nurse's Signature:  ________________________________________________________    Post Anesthesia Home Care Instructions  Activity: Get plenty of rest for the remainder of the day. A responsible adult should stay with you for 24 hours following the procedure.  For the next 24 hours, DO NOT: -Drive a car -Paediatric nurse -Drink alcoholic beverages -Take any medication unless instructed by your physician -Make any legal decisions or sign important papers.  Meals: Start with liquid foods such as gelatin or soup. Progress to regular foods as tolerated. Avoid greasy, spicy, heavy foods. If nausea and/or vomiting occur, drink only clear liquids until the nausea and/or vomiting subsides. Call your physician if vomiting continues.  Special Instructions/Symptoms: Your throat may feel dry or sore from the anesthesia or the breathing tube placed in your throat during surgery. If this causes discomfort, gargle with warm salt water. The discomfort should disappear within 24 hours.  If you had a scopolamine patch placed behind your ear for the management of post- operative nausea and/or vomiting:  1. The medication in the patch is effective for 72 hours, after which it should be removed.  Wrap patch in a tissue and discard in the trash. Wash hands thoroughly with soap and water. 2. You may remove the patch earlier than 72 hours if you experience unpleasant side effects which may include dry mouth, dizziness or visual disturbances. 3. Avoid touching the patch. Wash your hands with soap and water after contact with the patch.

## 2015-08-28 ENCOUNTER — Encounter (HOSPITAL_BASED_OUTPATIENT_CLINIC_OR_DEPARTMENT_OTHER): Payer: Self-pay | Admitting: Urology

## 2015-09-15 DIAGNOSIS — Z0000 Encounter for general adult medical examination without abnormal findings: Secondary | ICD-10-CM | POA: Diagnosis not present

## 2015-09-15 DIAGNOSIS — C678 Malignant neoplasm of overlapping sites of bladder: Secondary | ICD-10-CM | POA: Diagnosis not present

## 2015-09-24 ENCOUNTER — Ambulatory Visit (INDEPENDENT_AMBULATORY_CARE_PROVIDER_SITE_OTHER): Payer: Medicare Other | Admitting: Podiatry

## 2015-09-24 ENCOUNTER — Encounter: Payer: Self-pay | Admitting: Podiatry

## 2015-09-24 DIAGNOSIS — L600 Ingrowing nail: Secondary | ICD-10-CM

## 2015-09-24 NOTE — Patient Instructions (Signed)

## 2015-09-27 NOTE — Progress Notes (Signed)
Subjective:     Patient ID: [name], male   DOB: 1946-05-25, 70 y.o.   MRN: [id number]  HPI patient states I have chronic ingrown toenails on both my big toes which make it hard for me to wear shoe gear comfortably and I have tried to trim them myself and that's not successful have also been to the doctor to have them trimmed. 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 Patient wants deformity corrected and at this point I explained procedure and risk and I infiltrated each hallux 60 mg like Marcaine mixture remove the lateral borders exposed matrix and applied phenol 3 applications 30 seconds followed by alcohol lavage and sterile dressing. Gave instructions on soaks and reappoint

## 2015-09-28 DIAGNOSIS — C678 Malignant neoplasm of overlapping sites of bladder: Secondary | ICD-10-CM | POA: Diagnosis not present

## 2015-09-28 DIAGNOSIS — Z5111 Encounter for antineoplastic chemotherapy: Secondary | ICD-10-CM | POA: Diagnosis not present

## 2015-09-28 DIAGNOSIS — N390 Urinary tract infection, site not specified: Secondary | ICD-10-CM | POA: Diagnosis not present

## 2015-09-28 DIAGNOSIS — Z0000 Encounter for general adult medical examination without abnormal findings: Secondary | ICD-10-CM | POA: Diagnosis not present

## 2015-10-05 DIAGNOSIS — Z0000 Encounter for general adult medical examination without abnormal findings: Secondary | ICD-10-CM | POA: Diagnosis not present

## 2015-10-05 DIAGNOSIS — C678 Malignant neoplasm of overlapping sites of bladder: Secondary | ICD-10-CM | POA: Diagnosis not present

## 2015-10-05 DIAGNOSIS — N390 Urinary tract infection, site not specified: Secondary | ICD-10-CM | POA: Diagnosis not present

## 2015-10-06 ENCOUNTER — Telehealth: Payer: Self-pay | Admitting: *Deleted

## 2015-10-06 NOTE — Telephone Encounter (Signed)
Called patient at [phone] (Home #) to check to see how they were doing from their ingrown toenail procedure that was performed on Thursday, September 24, 2015. Pt stated, "Feeling good now, but had some pain the first couple days after the procedure".

## 2015-10-14 DIAGNOSIS — E109 Type 1 diabetes mellitus without complications: Secondary | ICD-10-CM | POA: Diagnosis not present

## 2015-10-14 DIAGNOSIS — M5136 Other intervertebral disc degeneration, lumbar region: Secondary | ICD-10-CM | POA: Diagnosis not present

## 2015-10-14 DIAGNOSIS — E782 Mixed hyperlipidemia: Secondary | ICD-10-CM | POA: Diagnosis not present

## 2015-10-14 DIAGNOSIS — C678 Malignant neoplasm of overlapping sites of bladder: Secondary | ICD-10-CM | POA: Diagnosis not present

## 2015-10-14 DIAGNOSIS — R911 Solitary pulmonary nodule: Secondary | ICD-10-CM | POA: Diagnosis not present

## 2015-10-14 DIAGNOSIS — M170 Bilateral primary osteoarthritis of knee: Secondary | ICD-10-CM | POA: Diagnosis not present

## 2015-10-19 DIAGNOSIS — Z0000 Encounter for general adult medical examination without abnormal findings: Secondary | ICD-10-CM | POA: Diagnosis not present

## 2015-10-19 DIAGNOSIS — Z5111 Encounter for antineoplastic chemotherapy: Secondary | ICD-10-CM | POA: Diagnosis not present

## 2015-10-19 DIAGNOSIS — C678 Malignant neoplasm of overlapping sites of bladder: Secondary | ICD-10-CM | POA: Diagnosis not present

## 2015-10-22 DIAGNOSIS — M549 Dorsalgia, unspecified: Secondary | ICD-10-CM | POA: Diagnosis not present

## 2015-10-22 DIAGNOSIS — M5440 Lumbago with sciatica, unspecified side: Secondary | ICD-10-CM | POA: Diagnosis not present

## 2015-10-22 DIAGNOSIS — Z6826 Body mass index (BMI) 26.0-26.9, adult: Secondary | ICD-10-CM | POA: Diagnosis not present

## 2015-10-22 DIAGNOSIS — M47816 Spondylosis without myelopathy or radiculopathy, lumbar region: Secondary | ICD-10-CM | POA: Diagnosis not present

## 2015-10-22 DIAGNOSIS — M5126 Other intervertebral disc displacement, lumbar region: Secondary | ICD-10-CM | POA: Diagnosis not present

## 2015-10-22 DIAGNOSIS — M5136 Other intervertebral disc degeneration, lumbar region: Secondary | ICD-10-CM | POA: Diagnosis not present

## 2015-10-22 DIAGNOSIS — M5416 Radiculopathy, lumbar region: Secondary | ICD-10-CM | POA: Diagnosis not present

## 2015-10-23 DIAGNOSIS — R911 Solitary pulmonary nodule: Secondary | ICD-10-CM | POA: Diagnosis not present

## 2015-10-23 DIAGNOSIS — R918 Other nonspecific abnormal finding of lung field: Secondary | ICD-10-CM | POA: Diagnosis not present

## 2015-10-28 DIAGNOSIS — Z0000 Encounter for general adult medical examination without abnormal findings: Secondary | ICD-10-CM | POA: Diagnosis not present

## 2015-10-28 DIAGNOSIS — C678 Malignant neoplasm of overlapping sites of bladder: Secondary | ICD-10-CM | POA: Diagnosis not present

## 2015-10-28 DIAGNOSIS — Z5111 Encounter for antineoplastic chemotherapy: Secondary | ICD-10-CM | POA: Diagnosis not present

## 2015-10-29 DIAGNOSIS — M47816 Spondylosis without myelopathy or radiculopathy, lumbar region: Secondary | ICD-10-CM | POA: Diagnosis not present

## 2015-10-29 DIAGNOSIS — M5136 Other intervertebral disc degeneration, lumbar region: Secondary | ICD-10-CM | POA: Diagnosis not present

## 2015-10-29 DIAGNOSIS — Z6826 Body mass index (BMI) 26.0-26.9, adult: Secondary | ICD-10-CM | POA: Diagnosis not present

## 2015-10-29 DIAGNOSIS — M5416 Radiculopathy, lumbar region: Secondary | ICD-10-CM | POA: Diagnosis not present

## 2015-10-29 DIAGNOSIS — M5440 Lumbago with sciatica, unspecified side: Secondary | ICD-10-CM | POA: Diagnosis not present

## 2015-11-03 DIAGNOSIS — Z8551 Personal history of malignant neoplasm of bladder: Secondary | ICD-10-CM | POA: Diagnosis not present

## 2015-11-05 ENCOUNTER — Ambulatory Visit: Payer: Medicare Other | Attending: Neurosurgery | Admitting: Physical Therapy

## 2015-11-05 DIAGNOSIS — M5442 Lumbago with sciatica, left side: Secondary | ICD-10-CM | POA: Insufficient documentation

## 2015-11-05 DIAGNOSIS — M6281 Muscle weakness (generalized): Secondary | ICD-10-CM | POA: Insufficient documentation

## 2015-11-05 NOTE — Patient Instructions (Addendum)
  HAMSTRING STRETCH WITH TOWEL  While lying down on your back, hook a towel or strap under  your foot and draw up your leg until a stretch is felt under your leg. calf area.  Keep your knee in a straightened position during the stretch.  Hold 30 seconds, perform 3 repetitions.  3 times a day.         Try lumbar roll when sitting.  Ruben Im PT [facility] [address], [place], [place] [address] Phone # [phone] Fax [phone]
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
## 2015-11-09 DIAGNOSIS — C678 Malignant neoplasm of overlapping sites of bladder: Secondary | ICD-10-CM | POA: Diagnosis not present

## 2015-11-09 DIAGNOSIS — Z5111 Encounter for antineoplastic chemotherapy: Secondary | ICD-10-CM | POA: Diagnosis not present

## 2015-11-10 ENCOUNTER — Ambulatory Visit: Payer: Medicare Other | Admitting: Physical Therapy

## 2015-11-10 DIAGNOSIS — M5442 Lumbago with sciatica, left side: Secondary | ICD-10-CM

## 2015-11-10 DIAGNOSIS — M6281 Muscle weakness (generalized): Secondary | ICD-10-CM

## 2015-11-10 NOTE — Patient Instructions (Addendum)
            Isometric Hold (Quadruped)   On hands and knees, slowly inhale, and then exhale. Pull navel toward spine and Hold for _3__ seconds. Continue to breathe in and out during hold. Rest for _3__ seconds. Repeat _5__ times. Do __1_ times a day.   Copyright  VHI. All rights reserved.  Bracing With Arm Raise (Quadruped)   On hands and knees find neutral spine. Tighten pelvic floor and abdominals and hold. Alternately lift arm to shoulder level. Repeat __5_ times. Do _1__ times a day.   Copyright  VHI. All rights reserved.  Bracing With Leg Raise (Quadruped)   On hands and knees find neutral spine. Tighten pelvic floor and abdominals and hold. Alternating legs, straighten and lift to hip level. Repeat _5__ times. Do 1___ times a day.   Copyright  VHI. All rights reserved.  Bracing With Arm / Leg Raise (Quadruped)   On hands and knees find neutral spine. Tighten pelvic floor and abdominals and hold. Alternating, lift arm to shoulder level and opposite leg to hip level. Repeat _5__ times. Do __1_ times a day.   Copyright  VHI. All rights reserved.    Ruben Im PT [facility] [address], [place], [place] [address] Phone # [phone] Fax [phone]
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
## 2015-11-12 ENCOUNTER — Ambulatory Visit: Payer: Medicare Other

## 2015-11-12 DIAGNOSIS — M6281 Muscle weakness (generalized): Secondary | ICD-10-CM

## 2015-11-12 DIAGNOSIS — M5442 Lumbago with sciatica, left side: Secondary | ICD-10-CM
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
## 2015-11-12 NOTE — Patient Instructions (Signed)
  HIP: Hamstrings - Short Sitting    Rest leg on raised surface. Keep knee straight. Lift chest. Hold __20_ seconds. _3__ reps per set, _3__ sets per day  Copyright  VHI. All rights reserved.  [place] [address], [place], [place] [address] Phone # [phone] Fax [phone]

## 2015-11-16 DIAGNOSIS — Z5111 Encounter for antineoplastic chemotherapy: Secondary | ICD-10-CM | POA: Diagnosis not present

## 2015-11-16 DIAGNOSIS — C678 Malignant neoplasm of overlapping sites of bladder: Secondary | ICD-10-CM | POA: Diagnosis not present

## 2015-11-17 ENCOUNTER — Ambulatory Visit: Payer: Medicare Other

## 2015-11-17 DIAGNOSIS — M6281 Muscle weakness (generalized): Secondary | ICD-10-CM | POA: Diagnosis not present

## 2015-11-17 DIAGNOSIS — M5442 Lumbago with sciatica, left side: Secondary | ICD-10-CM | POA: Diagnosis not present

## 2015-11-17 NOTE — Patient Instructions (Signed)
  ABDUCTION: Standing (Active)   Stand, feet flat. Lift right leg out to side. Use _0__ lbs. Complete __10_ repetitions. Perform __2_ sessions per day.     EXTENSION: Standing (Active)  Stand, both feet flat. Draw right leg behind body as far as possible. Use 0___ lbs. Complete 10 repetitions. Perform __2_ sessions per day.  Copyright  VHI. All rights reserved.   [facility] [address], [address] [place], [place] [address] Phone # [phone] Fax [phone] 
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
## 2015-11-19 ENCOUNTER — Encounter: Payer: PRIVATE HEALTH INSURANCE | Admitting: Physical Therapy

## 2015-11-23 DIAGNOSIS — C678 Malignant neoplasm of overlapping sites of bladder: Secondary | ICD-10-CM | POA: Diagnosis not present

## 2015-11-23 DIAGNOSIS — Z5111 Encounter for antineoplastic chemotherapy: Secondary | ICD-10-CM | POA: Diagnosis not present

## 2015-11-24 ENCOUNTER — Ambulatory Visit: Payer: Medicare Other | Admitting: Physical Therapy

## 2015-11-24 DIAGNOSIS — M5442 Lumbago with sciatica, left side: Secondary | ICD-10-CM | POA: Diagnosis not present

## 2015-11-24 DIAGNOSIS — M6281 Muscle weakness (generalized): Secondary | ICD-10-CM

## 2015-11-24 NOTE — Patient Instructions (Signed)
[name] PT [facility] [address], [address] [place], [place] [address] Phone # [phone] Fax [phone]    
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
## 2015-11-26 ENCOUNTER — Ambulatory Visit: Payer: Medicare Other | Admitting: Physical Therapy

## 2015-11-26 DIAGNOSIS — M6281 Muscle weakness (generalized): Secondary | ICD-10-CM | POA: Diagnosis not present

## 2015-11-26 DIAGNOSIS — M5442 Lumbago with sciatica, left side: Secondary | ICD-10-CM | POA: Diagnosis not present
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
## 2015-12-03 ENCOUNTER — Encounter: Payer: PRIVATE HEALTH INSURANCE | Admitting: Physical Therapy

## 2015-12-08 ENCOUNTER — Encounter: Payer: PRIVATE HEALTH INSURANCE | Admitting: Physical Therapy

## 2015-12-10 ENCOUNTER — Encounter: Payer: PRIVATE HEALTH INSURANCE | Admitting: Physical Therapy

## 2016-01-11 DIAGNOSIS — R1013 Epigastric pain: Secondary | ICD-10-CM | POA: Diagnosis not present

## 2016-01-11 DIAGNOSIS — R42 Dizziness and giddiness: Secondary | ICD-10-CM | POA: Diagnosis not present

## 2016-01-11 DIAGNOSIS — Z880 Allergy status to penicillin: Secondary | ICD-10-CM | POA: Diagnosis not present

## 2016-01-11 DIAGNOSIS — K648 Other hemorrhoids: Secondary | ICD-10-CM | POA: Diagnosis not present

## 2016-01-11 DIAGNOSIS — Z8249 Family history of ischemic heart disease and other diseases of the circulatory system: Secondary | ICD-10-CM | POA: Diagnosis not present

## 2016-01-11 DIAGNOSIS — E1165 Type 2 diabetes mellitus with hyperglycemia: Secondary | ICD-10-CM | POA: Diagnosis not present

## 2016-01-11 DIAGNOSIS — R112 Nausea with vomiting, unspecified: Secondary | ICD-10-CM | POA: Diagnosis not present

## 2016-02-02 ENCOUNTER — Ambulatory Visit (INDEPENDENT_AMBULATORY_CARE_PROVIDER_SITE_OTHER): Payer: Medicare Other | Admitting: Podiatry

## 2016-02-02 ENCOUNTER — Encounter: Payer: Self-pay | Admitting: Podiatry

## 2016-02-02 DIAGNOSIS — B351 Tinea unguium: Secondary | ICD-10-CM

## 2016-02-02 DIAGNOSIS — E1142 Type 2 diabetes mellitus with diabetic polyneuropathy: Secondary | ICD-10-CM | POA: Diagnosis not present

## 2016-02-02 DIAGNOSIS — M79676 Pain in unspecified toe(s): Secondary | ICD-10-CM

## 2016-02-02 NOTE — Progress Notes (Signed)
Patient ID: [name], male   DOB: 25-May-1946, 70 y.o.   MRN: [id number] Complaint:  Visit Type: Patient returns to my office for continued preventative foot care services. Complaint: Patient states" my nails have grown long and thick and become painful to walk and wear shoes" Patient has been diagnosed with DM with no foot complications. The patient presents for preventative foot care services. No changes to ROS.  Patient healing following nail surgery both big toes.  Podiatric Exam: Vascular: dorsalis pedis and posterior tibial pulses are palpable bilateral. Capillary return is immediate. Temperature gradient is WNL. Skin turgor WNL  Sensorium: Normal Semmes Weinstein monofilament test. Normal tactile sensation bilaterally. Nail Exam: Pt has thick disfigured discolored nails with subungual debris noted bilateral entire nail hallux through fifth toenails Ulcer Exam: There is no evidence of ulcer or pre-ulcerative changes or infection. Orthopedic Exam: Muscle tone and strength are WNL. No limitations in general ROM. No crepitus or effusions noted. Foot type and digits show no abnormalities. Bony prominences are unremarkable. Skin: No Porokeratosis. No infection or ulcers  Diagnosis:  Onychomycosis, , Pain in right toe, pain in left toes  Treatment & Plan Procedures and Treatment: Consent by patient was obtained for treatment procedures. The patient understood the discussion of treatment and procedures well. All questions were answered thoroughly reviewed. Debridement of mycotic and hypertrophic toenails, 1 through 5 bilateral and clearing of subungual debris. No ulceration, no infection noted.  Return Visit-Office Procedure: Patient instructed to return to the office for a follow up visit 4 months for continued evaluation and treatment.

## 2016-02-08 DIAGNOSIS — R911 Solitary pulmonary nodule: Secondary | ICD-10-CM | POA: Diagnosis not present

## 2016-02-08 DIAGNOSIS — C672 Malignant neoplasm of lateral wall of bladder: Secondary | ICD-10-CM | POA: Diagnosis not present

## 2016-02-24 DIAGNOSIS — Z5111 Encounter for antineoplastic chemotherapy: Secondary | ICD-10-CM | POA: Diagnosis not present

## 2016-02-24 DIAGNOSIS — C672 Malignant neoplasm of lateral wall of bladder: Secondary | ICD-10-CM | POA: Diagnosis not present

## 2016-03-02 DIAGNOSIS — C672 Malignant neoplasm of lateral wall of bladder: Secondary | ICD-10-CM | POA: Diagnosis not present

## 2016-03-02 DIAGNOSIS — Z5111 Encounter for antineoplastic chemotherapy: Secondary | ICD-10-CM | POA: Diagnosis not present

## 2016-03-09 DIAGNOSIS — R911 Solitary pulmonary nodule: Secondary | ICD-10-CM | POA: Diagnosis not present

## 2016-03-09 DIAGNOSIS — Z5111 Encounter for antineoplastic chemotherapy: Secondary | ICD-10-CM | POA: Diagnosis not present

## 2016-03-09 DIAGNOSIS — C672 Malignant neoplasm of lateral wall of bladder: Secondary | ICD-10-CM | POA: Diagnosis not present

## 2016-03-09 DIAGNOSIS — R918 Other nonspecific abnormal finding of lung field: Secondary | ICD-10-CM | POA: Diagnosis not present

## 2016-03-15 DIAGNOSIS — I82421 Acute embolism and thrombosis of right iliac vein: Secondary | ICD-10-CM | POA: Diagnosis not present

## 2016-03-15 DIAGNOSIS — E119 Type 2 diabetes mellitus without complications: Secondary | ICD-10-CM | POA: Diagnosis not present

## 2016-03-15 DIAGNOSIS — I82890 Acute embolism and thrombosis of other specified veins: Secondary | ICD-10-CM | POA: Diagnosis not present

## 2016-03-15 DIAGNOSIS — C679 Malignant neoplasm of bladder, unspecified: Secondary | ICD-10-CM | POA: Diagnosis not present

## 2016-03-15 DIAGNOSIS — R600 Localized edema: Secondary | ICD-10-CM | POA: Diagnosis not present

## 2016-03-15 DIAGNOSIS — Z79899 Other long term (current) drug therapy: Secondary | ICD-10-CM | POA: Diagnosis not present

## 2016-03-15 DIAGNOSIS — Z23 Encounter for immunization: Secondary | ICD-10-CM | POA: Diagnosis not present

## 2016-03-16 DIAGNOSIS — I82421 Acute embolism and thrombosis of right iliac vein: Secondary | ICD-10-CM | POA: Diagnosis not present

## 2016-03-23 ENCOUNTER — Emergency Department (HOSPITAL_COMMUNITY): Payer: Medicare Other

## 2016-03-23 ENCOUNTER — Encounter (HOSPITAL_COMMUNITY): Payer: Self-pay | Admitting: Emergency Medicine

## 2016-03-23 ENCOUNTER — Emergency Department (HOSPITAL_COMMUNITY)
Admission: EM | Admit: 2016-03-23 | Discharge: 2016-03-23 | Disposition: A | Discharge status: Home / Self Care | Payer: Medicare Other | Attending: Emergency Medicine | Admitting: Emergency Medicine

## 2016-03-23 DIAGNOSIS — Z7984 Long term (current) use of oral hypoglycemic drugs: Secondary | ICD-10-CM | POA: Insufficient documentation

## 2016-03-23 DIAGNOSIS — R0600 Dyspnea, unspecified: Secondary | ICD-10-CM | POA: Diagnosis not present

## 2016-03-23 DIAGNOSIS — Z87891 Personal history of nicotine dependence: Secondary | ICD-10-CM | POA: Diagnosis not present

## 2016-03-23 DIAGNOSIS — I2699 Other pulmonary embolism without acute cor pulmonale: Secondary | ICD-10-CM | POA: Diagnosis not present

## 2016-03-23 DIAGNOSIS — I82491 Acute embolism and thrombosis of other specified deep vein of right lower extremity: Secondary | ICD-10-CM | POA: Diagnosis not present

## 2016-03-23 DIAGNOSIS — E119 Type 2 diabetes mellitus without complications: Secondary | ICD-10-CM | POA: Diagnosis not present

## 2016-03-23 DIAGNOSIS — R0602 Shortness of breath: Secondary | ICD-10-CM | POA: Diagnosis not present

## 2016-03-23 DIAGNOSIS — R918 Other nonspecific abnormal finding of lung field: Secondary | ICD-10-CM | POA: Diagnosis not present

## 2016-03-23 LAB — CBC WITH DIFFERENTIAL/PLATELET
BASOS ABS: 0 10*3/uL (ref 0.0–0.1)
BASOS PCT: 1 %
EOS PCT: 6 %
Eosinophils Absolute: 0.4 10*3/uL (ref 0.0–0.7)
HCT: 36 % — ABNORMAL LOW (ref 39.0–52.0)
Hemoglobin: 12.5 g/dL — ABNORMAL LOW (ref 13.0–17.0)
Lymphocytes Relative: 24 %
Lymphs Abs: 1.5 10*3/uL (ref 0.7–4.0)
MCH: 30.6 pg (ref 26.0–34.0)
MCHC: 34.7 g/dL (ref 30.0–36.0)
MCV: 88.2 fL (ref 78.0–100.0)
MONO ABS: 0.6 10*3/uL (ref 0.1–1.0)
Monocytes Relative: 9 %
Neutro Abs: 4 10*3/uL (ref 1.7–7.7)
Neutrophils Relative %: 60 %
PLATELETS: 178 10*3/uL (ref 150–400)
RBC: 4.08 MIL/uL — ABNORMAL LOW (ref 4.22–5.81)
RDW: 13 % (ref 11.5–15.5)
WBC: 6.6 10*3/uL (ref 4.0–10.5)

## 2016-03-23 LAB — BASIC METABOLIC PANEL
ANION GAP: 9 (ref 5–15)
BUN: 13 mg/dL (ref 6–20)
CALCIUM: 9.4 mg/dL (ref 8.9–10.3)
CO2: 22 mmol/L (ref 22–32)
Chloride: 102 mmol/L (ref 101–111)
Creatinine, Ser: 0.92 mg/dL (ref 0.61–1.24)
GFR calc Af Amer: >60 mL/min (ref >60)
GLUCOSE: 210 mg/dL — AB (ref 65–99)
Potassium: 4.2 mmol/L (ref 3.5–5.1)
Sodium: 133 mmol/L — ABNORMAL LOW (ref 135–145)

## 2016-03-23 LAB — APTT: APTT: 32 s (ref 24–36)

## 2016-03-23 LAB — PROTIME-INR
INR: 0.96
Prothrombin Time: 12.8 seconds (ref 11.4–15.2)

## 2016-03-23 MED ORDER — IOPAMIDOL (ISOVUE-370) INJECTION 76%
100.0000 mL | Freq: Once | INTRAVENOUS | Status: AC | PRN
  Administered 2016-03-23: 100 mL via INTRAVENOUS

## 2016-03-23 NOTE — ED Provider Notes (Signed)
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 He was placed on Lovenox which she has been taking but had some episodes of shortness of breath over the weekend. At this time the patient has no complaints but when he went to his family doctor's office today for a follow-up exam they recommended further evaluation with a CT angiogram to rule out a pulmonary embolism. On exam the patient is in no distress, pulse of 65, oxygen 100% without any abnormal lung sounds. His right lower extremity has a slight erythema and mild swelling but no pitting edema. He has normal pulses, normal capillary refill, normal range of motion of the leg. On a bedside ultrasound to confirm the non-compressibility of the femoral vein on the right. He will undergo CT angiogram to rule out pulmonary embolism otherwise the patient appears totally stable. I had a long discussion with the family regarding the types of anticoagulation and that at this time he should follow his doctor's instructions to maintain Lovenox therapy until they have further discussion of the office regarding oral anticoagulant possibilities.  Medical screening examination/treatment/procedure(s) were conducted as a shared visit with non-physician practitioner(s) and myself.  I personally evaluated the patient during the encounter.  Clinical Impression:   Final diagnoses:  Dyspnea, unspecified type  Other pulmonary embolism without acute cor pulmonale, unspecified chronicity (HCC)         Noemi Chapel, MD 03/25/16 1120

## 2016-03-23 NOTE — ED Triage Notes (Signed)
Patient here sent by [facility] with new onset complaints of SOB and fatigue. Known blood clot to the right leg. Lovenox injections. 99% RA AAO x4.
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
## 2016-03-23 NOTE — ED Notes (Signed)
Patient transported to CT 

## 2016-03-29 DIAGNOSIS — I2699 Other pulmonary embolism without acute cor pulmonale: Secondary | ICD-10-CM | POA: Diagnosis not present

## 2016-03-29 DIAGNOSIS — I82421 Acute embolism and thrombosis of right iliac vein: Secondary | ICD-10-CM | POA: Diagnosis not present

## 2016-04-12 DIAGNOSIS — I82421 Acute embolism and thrombosis of right iliac vein: Secondary | ICD-10-CM | POA: Diagnosis not present

## 2016-04-26 ENCOUNTER — Ambulatory Visit (INDEPENDENT_AMBULATORY_CARE_PROVIDER_SITE_OTHER): Payer: Medicare Other | Admitting: Podiatry

## 2016-04-26 ENCOUNTER — Encounter: Payer: Self-pay | Admitting: Podiatry

## 2016-04-26 ENCOUNTER — Other Ambulatory Visit: Payer: Self-pay | Admitting: Family Medicine

## 2016-04-26 VITALS — Ht 72.0 in | Wt 190.0 lb

## 2016-04-26 DIAGNOSIS — E1142 Type 2 diabetes mellitus with diabetic polyneuropathy: Secondary | ICD-10-CM | POA: Diagnosis not present

## 2016-04-26 DIAGNOSIS — B351 Tinea unguium: Secondary | ICD-10-CM

## 2016-04-26 DIAGNOSIS — R918 Other nonspecific abnormal finding of lung field: Secondary | ICD-10-CM

## 2016-04-26 NOTE — Progress Notes (Signed)
Patient ID: [name], male   DOB: 03/01/1946, 70 y.o.   MRN: [id number] Complaint:  Visit Type: Patient returns to my office for continued preventative foot care services. Complaint: Patient states" my nails have grown long and thick and become painful to walk and wear shoes" Patient has been diagnosed with DM with no foot complications. The patient presents for preventative foot care services. No changes to ROS.  Patient healing following nail surgery both big toes.  Podiatric Exam: Vascular: dorsalis pedis and posterior tibial pulses are palpable bilateral. Capillary return is immediate. Temperature gradient is WNL. Skin turgor WNL  Sensorium: Normal Semmes Weinstein monofilament test. Normal tactile sensation bilaterally. Nail Exam: Pt has thick disfigured discolored nails with subungual debris noted bilateral entire nail hallux through fifth toenails Ulcer Exam: There is no evidence of ulcer or pre-ulcerative changes or infection. Orthopedic Exam: Muscle tone and strength are WNL. No limitations in general ROM. No crepitus or effusions noted. Foot type and digits show no abnormalities. Bony prominences are unremarkable. Skin: No Porokeratosis. No infection or ulcers  Diagnosis:  Onychomycosis, , Pain in right toe, pain in left toes  Treatment & Plan Procedures and Treatment: Consent by patient was obtained for treatment procedures. The patient understood the discussion of treatment and procedures well. All questions were answered thoroughly reviewed. Debridement of mycotic and hypertrophic toenails, 1 through 5 bilateral and clearing of subungual debris. No ulceration, no infection noted.  Return Visit-Office Procedure: Patient instructed to return to the office for a follow up visit  Prn since he is going to [place].   Bane Hagy DPM 

## 2016-04-28 ENCOUNTER — Ambulatory Visit
Admission: RE | Admit: 2016-04-28 | Discharge: 2016-04-28 | Disposition: A | Discharge status: Home / Self Care | Payer: Medicare Other | Source: Ambulatory Visit | Attending: Family Medicine | Admitting: Family Medicine

## 2016-04-28 DIAGNOSIS — R918 Other nonspecific abnormal finding of lung field: Secondary | ICD-10-CM

## 2016-04-28 DIAGNOSIS — R911 Solitary pulmonary nodule: Secondary | ICD-10-CM | POA: Diagnosis not present

## 2016-06-06 DIAGNOSIS — C672 Malignant neoplasm of lateral wall of bladder: Secondary | ICD-10-CM | POA: Diagnosis not present

## 2016-06-10 ENCOUNTER — Other Ambulatory Visit: Payer: Self-pay | Admitting: Urology

## 2016-06-10 ENCOUNTER — Ambulatory Visit (INDEPENDENT_AMBULATORY_CARE_PROVIDER_SITE_OTHER): Payer: Medicare Other | Admitting: Internal Medicine

## 2016-06-10 ENCOUNTER — Encounter: Payer: Self-pay | Admitting: Internal Medicine

## 2016-06-10 VITALS — BP 120/80 | HR 64 | Ht 72.0 in | Wt 196.6 lb

## 2016-06-10 DIAGNOSIS — O2230 Deep phlebothrombosis in pregnancy, unspecified trimester: Secondary | ICD-10-CM

## 2016-06-10 DIAGNOSIS — R918 Other nonspecific abnormal finding of lung field: Secondary | ICD-10-CM

## 2016-06-10 DIAGNOSIS — R0609 Other forms of dyspnea: Secondary | ICD-10-CM
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
## 2016-06-10 NOTE — Patient Instructions (Addendum)
You will need to arrange a repeat venous doppler of both legs by whoever did the first one to see what changes have occurred on blood thinners  Please see patient coordinator before you leave today  to schedule echocardiogram  - once I have that and the venous doppler report  I can let your urologist know whether it's safe to stop the anticoagulation and for how long and whether the filter is needed.   Your risk sound relatively low but zero and you will need to accept that to go forward with any form of surgery but seems worth it to me if your urologist says the risk of cancer spread is well above the risk of another blood clot   Pulmonary follow up is as needed  Add in computer for recall CT s contrast at one year re MPNs in pt with bladder ca

## 2016-06-11 DIAGNOSIS — R918 Other nonspecific abnormal finding of lung field: Secondary | ICD-10-CM | POA: Insufficient documentation

## 2016-06-11 NOTE — Assessment & Plan Note (Signed)
CT 04/28/16  The index nodule seen in the left lower lobe along the fissure measures 7 mm in average versus 6 mm previously. These are more commonly interfissural lymph nodes. Right-sided nodules have remained stable averaging between 4 and 6 mm - rec f/u ct s contrast 04/28/17 and placed in reminder file   CT results reviewed with pt >>> Too small for PET or bx, not suspicious enough for excisional bx > really only option for now is follow the Fleischner society guidelines as rec by radiology.   = one year in pt with known primary ca of bladder / smoking hx not as much of a risk factor >15 y out

## 2016-06-11 NOTE — Assessment & Plan Note (Signed)
Chest CTa 03/23/16 pos for probably previous PE 06/10/2016  Walked RA x 3 laps @ 185 ft each stopped due to  End of study, nl pace, no sob or desat  But limited more by gait from walking faster - Echo 06/10/2016 >>>   On anticoagulation since 03/15/16 and now needs more bladder surgery.  Doubt doe now has anything to do with previous PE esp since not much of a clot burden when first dx'd but does still have some swelling in the R leg  So needs to get the doppler reviewed and if still active clot would need temporary filter placed preop.  If not  Active dvt and nl RH pressures on CT could go ahead and be off eliquis x 36 h preop and immediately back on lovenox asap post op.  Discussed in detail all the  indications, usual  risks and alternatives  relative to the benefits with patient who agrees to proceed with w/u and surgery as planned  Total time devoted to counseling  > 50 % of 60 min new pt office visit:  review case with pt/ discussion of options/alternatives/ personally creating written customized instructions  in presence of pt  then going over those specific  Instructions directly with the pt including how to use all of the meds but in particular covering each new medication in detail and the difference between the maintenance/automatic meds and the prns using an action plan format for the latter.  Please see AVS from this visit for a full list of these instructions which I personally wrote for this pt and  are unique to this visit.

## 2016-06-14 ENCOUNTER — Ambulatory Visit (HOSPITAL_COMMUNITY)
Admission: RE | Admit: 2016-06-14 | Discharge: 2016-06-14 | Disposition: A | Discharge status: Home / Self Care | Payer: Medicare Other | Source: Ambulatory Visit | Attending: Family Medicine | Admitting: Family Medicine

## 2016-06-14 DIAGNOSIS — Z86718 Personal history of other venous thrombosis and embolism: Secondary | ICD-10-CM

## 2016-06-14 DIAGNOSIS — I82811 Embolism and thrombosis of superficial veins of right lower extremities: Secondary | ICD-10-CM | POA: Diagnosis not present

## 2016-06-14 DIAGNOSIS — C672 Malignant neoplasm of lateral wall of bladder: Secondary | ICD-10-CM | POA: Insufficient documentation

## 2016-06-14 DIAGNOSIS — Z01810 Encounter for preprocedural cardiovascular examination: Secondary | ICD-10-CM

## 2016-06-14 DIAGNOSIS — O2230 Deep phlebothrombosis in pregnancy, unspecified trimester: Secondary | ICD-10-CM

## 2016-06-14 NOTE — Progress Notes (Signed)
VASCULAR LAB PRELIMINARY  PRELIMINARY  PRELIMINARY  PRELIMINARY  Right lower extremity venous duplex completed.    Preliminary report:  There is no acute DVT noted in the right lower extremity.  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  There is chronic SVT noted in the right small saphenous vein.    Called results to [place], RN  Mckinze Poirier, [place], RVT 06/14/2016, 9:33 AM

## 2016-06-17 ENCOUNTER — Other Ambulatory Visit: Payer: Self-pay

## 2016-06-17 ENCOUNTER — Encounter: Payer: Self-pay | Admitting: Vascular Surgery

## 2016-06-17 ENCOUNTER — Ambulatory Visit (INDEPENDENT_AMBULATORY_CARE_PROVIDER_SITE_OTHER): Payer: Medicare Other | Admitting: Vascular Surgery

## 2016-06-17 VITALS — BP 142/83 | HR 67 | Temp 96.8°F | Ht 72.0 in | Wt 198.0 lb

## 2016-06-17 DIAGNOSIS — I82401 Acute embolism and thrombosis of unspecified deep veins of right lower extremity: Secondary | ICD-10-CM | POA: Diagnosis not present

## 2016-06-17 DIAGNOSIS — H9202 Otalgia, left ear: Secondary | ICD-10-CM | POA: Diagnosis not present
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
## 2016-06-17 NOTE — Progress Notes (Signed)
Vitals:   06/17/16 1014  BP: (!) 148/85  Pulse: 67  Temp: (!) 96.8 F (36 C)  Weight: 198 lb (89.8 kg)  Height: 6' (1.829 m)

## 2016-06-20 ENCOUNTER — Encounter (HOSPITAL_COMMUNITY)
Admission: RE | Disposition: A | Discharge status: Home / Self Care | Payer: Self-pay | Source: Ambulatory Visit | Attending: Vascular Surgery

## 2016-06-20 ENCOUNTER — Ambulatory Visit (HOSPITAL_COMMUNITY)
Admission: RE | Admit: 2016-06-20 | Discharge: 2016-06-20 | Disposition: A | Discharge status: Home / Self Care | Payer: Medicare Other | Source: Ambulatory Visit | Attending: Vascular Surgery | Admitting: Vascular Surgery

## 2016-06-20 ENCOUNTER — Encounter (HOSPITAL_COMMUNITY): Payer: Self-pay | Admitting: Vascular Surgery

## 2016-06-20 DIAGNOSIS — R351 Nocturia: Secondary | ICD-10-CM | POA: Diagnosis not present

## 2016-06-20 DIAGNOSIS — K219 Gastro-esophageal reflux disease without esophagitis: Secondary | ICD-10-CM | POA: Diagnosis not present

## 2016-06-20 DIAGNOSIS — Z87891 Personal history of nicotine dependence: Secondary | ICD-10-CM | POA: Insufficient documentation

## 2016-06-20 DIAGNOSIS — Z7901 Long term (current) use of anticoagulants: Secondary | ICD-10-CM | POA: Diagnosis not present

## 2016-06-20 DIAGNOSIS — Z7984 Long term (current) use of oral hypoglycemic drugs: Secondary | ICD-10-CM | POA: Insufficient documentation

## 2016-06-20 DIAGNOSIS — I2782 Chronic pulmonary embolism: Secondary | ICD-10-CM | POA: Diagnosis not present

## 2016-06-20 DIAGNOSIS — E785 Hyperlipidemia, unspecified: Secondary | ICD-10-CM | POA: Insufficient documentation

## 2016-06-20 DIAGNOSIS — Z408 Encounter for other prophylactic surgery: Secondary | ICD-10-CM | POA: Insufficient documentation

## 2016-06-20 DIAGNOSIS — Z86718 Personal history of other venous thrombosis and embolism: Secondary | ICD-10-CM | POA: Diagnosis not present

## 2016-06-20 DIAGNOSIS — Z86711 Personal history of pulmonary embolism: Secondary | ICD-10-CM | POA: Diagnosis not present

## 2016-06-20 DIAGNOSIS — R350 Frequency of micturition: Secondary | ICD-10-CM | POA: Insufficient documentation

## 2016-06-20 DIAGNOSIS — E119 Type 2 diabetes mellitus without complications: Secondary | ICD-10-CM | POA: Diagnosis not present

## 2016-06-20 DIAGNOSIS — Z880 Allergy status to penicillin: Secondary | ICD-10-CM | POA: Diagnosis not present

## 2016-06-20 DIAGNOSIS — Z8551 Personal history of malignant neoplasm of bladder: Secondary | ICD-10-CM | POA: Insufficient documentation

## 2016-06-20 DIAGNOSIS — I82491 Acute embolism and thrombosis of other specified deep vein of right lower extremity: Secondary | ICD-10-CM | POA: Diagnosis not present

## 2016-06-20 HISTORY — PX: PERIPHERAL VASCULAR CATHETERIZATION: SHX172C

## 2016-06-20 LAB — POCT I-STAT, CHEM 8
BUN: 20 mg/dL (ref 6–20)
CALCIUM ION: 1.19 mmol/L (ref 1.15–1.40)
CREATININE: 1 mg/dL (ref 0.61–1.24)
Chloride: 100 mmol/L — ABNORMAL LOW (ref 101–111)
GLUCOSE: 341 mg/dL — AB (ref 65–99)
HCT: 38 % — ABNORMAL LOW (ref 39.0–52.0)
HEMOGLOBIN: 12.9 g/dL — AB (ref 13.0–17.0)
Potassium: 4.6 mmol/L (ref 3.5–5.1)
Sodium: 136 mmol/L (ref 135–145)
TCO2: 26 mmol/L (ref 0–100)

## 2016-06-20 LAB — GLUCOSE, CAPILLARY: Glucose-Capillary: 248 mg/dL — ABNORMAL HIGH (ref 65–99)

## 2016-06-20 SURGERY — IVC FILTER INSERTION
Anesthesia: LOCAL

## 2016-06-20 MED ORDER — IOHEXOL 350 MG/ML SOLN: INTRAVENOUS | Status: DC | PRN

## 2016-06-20 MED ORDER — MIDAZOLAM HCL 2 MG/2ML IJ SOLN
INTRAMUSCULAR | Status: AC
  Filled 2016-06-20: qty 2

## 2016-06-20 MED ORDER — HEPARIN (PORCINE) IN NACL 2-0.9 UNIT/ML-% IJ SOLN
INTRAMUSCULAR | Status: DC | PRN
  Administered 2016-06-20: 500 mL

## 2016-06-20 MED ORDER — LIDOCAINE HCL (PF) 1 % IJ SOLN
INTRAMUSCULAR | Status: DC | PRN
  Administered 2016-06-20: 12 mL

## 2016-06-20 MED ORDER — FENTANYL CITRATE (PF) 100 MCG/2ML IJ SOLN
INTRAMUSCULAR | Status: AC
  Filled 2016-06-20: qty 2

## 2016-06-20 MED ORDER — MIDAZOLAM HCL 2 MG/2ML IJ SOLN
INTRAMUSCULAR | Status: DC | PRN
  Administered 2016-06-20: 1 mg via INTRAVENOUS

## 2016-06-20 MED ORDER — INSULIN ASPART 100 UNIT/ML ~~LOC~~ SOLN
8.0000 [IU] | Freq: Once | SUBCUTANEOUS | Status: AC
  Administered 2016-06-20: 8 [IU] via SUBCUTANEOUS

## 2016-06-20 MED ORDER — FENTANYL CITRATE (PF) 100 MCG/2ML IJ SOLN
INTRAMUSCULAR | Status: DC | PRN
  Administered 2016-06-20: 50 ug via INTRAVENOUS

## 2016-06-20 MED ORDER — IODIXANOL 320 MG/ML IV SOLN
INTRAVENOUS | Status: DC | PRN
  Administered 2016-06-20: 70 mL

## 2016-06-20 MED ORDER — INSULIN ASPART 100 UNIT/ML ~~LOC~~ SOLN
SUBCUTANEOUS | Status: AC
  Administered 2016-06-20: 8 [IU] via SUBCUTANEOUS
  Filled 2016-06-20: qty 1

## 2016-06-20 MED ORDER — LIDOCAINE HCL (PF) 1 % IJ SOLN
INTRAMUSCULAR | Status: AC
  Filled 2016-06-20: qty 30

## 2016-06-20 MED ORDER — HEPARIN (PORCINE) IN NACL 2-0.9 UNIT/ML-% IJ SOLN
INTRAMUSCULAR | Status: AC
  Filled 2016-06-20: qty 500

## 2016-06-20 MED ORDER — SODIUM CHLORIDE 0.9 % IV SOLN
INTRAVENOUS | Status: DC
  Administered 2016-06-20: 06:00:00 via INTRAVENOUS

## 2016-06-20 SURGICAL SUPPLY — 10 items
COVER PRB 48X5XTLSCP FOLD TPE (BAG) ×1 IMPLANT
COVER PROBE 5X48 (BAG) ×1
FILTER CELECT-JUGULAR (Filter) IMPLANT
FILTER VC CELECT-FEMORAL (Filter) ×2 IMPLANT
KIT MICROINTRODUCER STIFF 5F (SHEATH) ×2 IMPLANT
KIT PV (KITS) ×2 IMPLANT
SYR MEDRAD MARK V 150ML (SYRINGE) ×2 IMPLANT
TRANSDUCER W/STOPCOCK (MISCELLANEOUS) ×2 IMPLANT
TRAY PV CATH (CUSTOM PROCEDURE TRAY) ×2 IMPLANT
WIRE BENTSON .035X145CM (WIRE) ×2 IMPLANT

## 2016-06-20 NOTE — Interval H&P Note (Signed)
History and Physical Interval Note:  06/20/2016 7:31 AM  [name]  has presented today for surgery, with the diagnosis of dvt  The various methods of treatment have been discussed with the patient and family. After consideration of risks, benefits and other options for treatment, the patient has consented to  Procedure(s): IVC Filter Insertion (N/A) as a surgical intervention .  The patient's history has been reviewed, patient examined, no change in status, stable for surgery.  I have reviewed the patient's chart and labs.  Questions were answered to the patient's satisfaction.     [name]

## 2016-06-20 NOTE — Op Note (Signed)
   PATIENT: [name]   MRN: [id number] DOB: 10-01-1945    DATE OF PROCEDURE: 06/20/2016  INDICATIONS: [name] is a 71 y.o. male with a history of bladder cancer. Patient had an extensive right lower extremity DVT that was diagnosed in October 2017. He subsequently was found on a CT angiogram to have a small pulmonary embolus. He has been on Eliquis. Given that he will need to come off of his anticoagulation for his upcoming surgery on his bladder cancer, placement of an IVC filter was recommended to lower his risk of pulmonary embolus when he is off anticoagulation. Given that he may potentially require multiple procedures I felt that this filter should not be retrieved.  PROCEDURE:  1. Ultrasound-guided access to the right common femoral vein 2. Venacavogram 3. Placement of Cook Celect IVC filter  SURGEON: Judeth Cornfield. Scot Dock, MD, FACS  ANESTHESIA: Local with sedation   EBL: Minimal  TECHNIQUE: The patient was taken to the peripheral vascular lab and sedated. The period of conscious sedation was 24 minutes.  During that time period, I was present face-to-face 100% of the time.  The patient was administered (1 mg of Versed and 50 g of fentanyl). The patient's heart rate, blood pressure, and oxygen saturation were monitored by the nurse continuously during the procedure.  Both groins were prepped and draped in usual sterile fashion. Under ultrasound guidance, after the skin was anesthetized, the right common femoral vein was cannulated with a micropuncture needle and a micropuncture sheath introduced over a wire. A Benson wire was placed through the micropuncture sheath into the inferior vena cava. The dilator and sheath were advanced over the wire and positioned at the level just below the renal veins. The dilator was removed and a venacavogram obtained to demonstrate the position of the renal veins. The IVC was less than 30 mm in diameter.. The filter was then advanced through  the sheath and positioned just below the renal veins. The sheath was retracted. It was apparent that this was the device for a jugular approach and therefore this was retracted back into the sheath and the filter removed.  The femoral IVC filter was then advanced through the sheath and positioned below the renal veins based on the IVC cavogram. The sheath was retracted. The filter was in excellent position. The filter was then deployed without difficulty. The sheath was then retracted and pressure held for hemostasis. No immediate complications were noted.   FINDINGS: Excellent filter position at the L1 vertebral body level below the renal veins.  [name], MD, FACS [facility]  DATE OF DICTATION:   06/20/2016
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
## 2016-06-20 NOTE — Discharge Instructions (Signed)
°

## 2016-06-28 ENCOUNTER — Other Ambulatory Visit: Payer: Self-pay

## 2016-06-28 ENCOUNTER — Ambulatory Visit (HOSPITAL_COMMUNITY): Payer: Medicare Other | Attending: Cardiovascular Disease

## 2016-06-28 DIAGNOSIS — C679 Malignant neoplasm of bladder, unspecified: Secondary | ICD-10-CM | POA: Insufficient documentation

## 2016-06-28 DIAGNOSIS — R0609 Other forms of dyspnea: Secondary | ICD-10-CM | POA: Insufficient documentation

## 2016-06-28 DIAGNOSIS — I2699 Other pulmonary embolism without acute cor pulmonale: Secondary | ICD-10-CM | POA: Diagnosis not present

## 2016-06-28 DIAGNOSIS — Z87891 Personal history of nicotine dependence: Secondary | ICD-10-CM | POA: Diagnosis not present

## 2016-06-28 DIAGNOSIS — I517 Cardiomegaly: Secondary | ICD-10-CM | POA: Insufficient documentation

## 2016-06-28 NOTE — Progress Notes (Signed)
Spoke with pt and notified of results per Dr. Wert. Pt verbalized understanding and denied any questions. 

## 2016-06-29 ENCOUNTER — Other Ambulatory Visit: Payer: Self-pay | Admitting: Urology

## 2016-06-29 NOTE — Progress Notes (Signed)
Scheduling pre op-  PLEASE PLACE SURGICAL ORDERS IN EPIC   thanks

## 2016-06-30 NOTE — Patient Instructions (Signed)
[name]  06/30/2016   Your procedure is scheduled on: 07/07/2016   Report to [facility]  Entrance take [name]  elevators to 3rd floor to  [name] at  1000 AM.  Call this number if you have problems the morning of surgery [phone]   Remember: ONLY 1 PERSON MAY GO WITH YOU TO SHORT STAY TO GET  READY MORNING OF [name].  Do not eat food or drink liquids :After Midnight.     Take these medicines the morning of surgery with A SIP OF WATER: none  DO NOT TAKE ANY DIABETIC MEDICATIONS DAY OF YOUR SURGERY                               You may not have any metal on your body including hair pins and              piercings  Do not wear jewelry,  lotions, powders or perfumes, deodorant                       Men may shave face and neck.   Do not bring valuables to the hospital. [name].  Contacts, dentures or bridgework may not be worn into surgery.  Leave suitcase in the car. After surgery it may be brought to your room.     Patients discharged the day of surgery will not be allowed to drive home.  Name and phone number of your driver:  Special Instructions: N/A              Please read over the following fact sheets you were given: _____________________________________________________________________             [name] - Preparing for Surgery Before surgery, you can play an important role.  Because skin is not sterile, your skin needs to be as free of germs as possible.  You can reduce the number of germs on your skin by washing with CHG (chlorahexidine gluconate) soap before surgery.  CHG is an antiseptic cleaner which kills germs and bonds with the skin to continue killing germs even after washing. Please DO NOT use if you have an allergy to CHG or antibacterial soaps.  If your skin becomes reddened/irritated stop using the CHG and inform your nurse when you arrive at Short  Stay. Do not shave (including legs and underarms) for at least 48 hours prior to the first CHG shower.  You may shave your face/neck. Please follow these instructions carefully:  1.  Shower with CHG Soap the night before surgery and the  morning of Surgery.  2.  If you choose to wash your hair, wash your hair first as usual with your  normal  shampoo.  3.  After you shampoo, rinse your hair and body thoroughly to remove the  shampoo.                           4.  Use CHG as you would any other liquid soap.  You can apply chg directly  to the skin and wash  Gently with a scrungie or clean washcloth.  5.  Apply the CHG Soap to your body ONLY FROM THE NECK DOWN.   Do not use on face/ open                           Wound or open sores. Avoid contact with eyes, ears mouth and genitals (private parts).                       Wash face,  Genitals (private parts) with your normal soap.             6.  Wash thoroughly, paying special attention to the area where your surgery  will be performed.  7.  Thoroughly rinse your body with warm water from the neck down.  8.  DO NOT shower/wash with your normal soap after using and rinsing off  the CHG Soap.                9.  Pat yourself dry with a clean towel.            10.  Wear clean pajamas.            11.  Place clean sheets on your bed the night of your first shower and do not  sleep with pets. Day of Surgery : Do not apply any lotions/deodorants the morning of surgery.  Please wear clean clothes to the hospital/surgery center.  FAILURE TO FOLLOW THESE INSTRUCTIONS MAY RESULT IN THE CANCELLATION OF YOUR SURGERY PATIENT SIGNATURE_________________________________  NURSE SIGNATURE__________________________________  ________________________________________________________________________

## 2016-07-01 ENCOUNTER — Encounter (HOSPITAL_COMMUNITY)
Admission: RE | Admit: 2016-07-01 | Discharge: 2016-07-01 | Disposition: A | Discharge status: Home / Self Care | Payer: Medicare Other | Source: Ambulatory Visit | Attending: Urology | Admitting: Urology

## 2016-07-01 ENCOUNTER — Encounter (HOSPITAL_COMMUNITY): Payer: Self-pay

## 2016-07-01 DIAGNOSIS — Z7901 Long term (current) use of anticoagulants: Secondary | ICD-10-CM | POA: Insufficient documentation

## 2016-07-01 DIAGNOSIS — Z01818 Encounter for other preprocedural examination: Secondary | ICD-10-CM | POA: Diagnosis not present

## 2016-07-01 DIAGNOSIS — Z79899 Other long term (current) drug therapy: Secondary | ICD-10-CM | POA: Diagnosis not present

## 2016-07-01 DIAGNOSIS — Z7984 Long term (current) use of oral hypoglycemic drugs: Secondary | ICD-10-CM | POA: Diagnosis not present

## 2016-07-01 DIAGNOSIS — D494 Neoplasm of unspecified behavior of bladder: Secondary | ICD-10-CM | POA: Insufficient documentation

## 2016-07-01 HISTORY — DX: Acute embolism and thrombosis of unspecified deep veins of unspecified lower extremity: I82.409

## 2016-07-01 HISTORY — DX: Malignant (primary) neoplasm, unspecified: C80.1

## 2016-07-01 HISTORY — DX: Unspecified osteoarthritis, unspecified site: M19.90

## 2016-07-01 HISTORY — DX: Personal history of other medical treatment: Z92.89

## 2016-07-01 HISTORY — DX: Embolism and thrombosis of unspecified artery: I74.9

## 2016-07-01 HISTORY — DX: Other pulmonary embolism without acute cor pulmonale: I26.99

## 2016-07-01 LAB — BASIC METABOLIC PANEL
ANION GAP: 9 (ref 5–15)
BUN: 15 mg/dL (ref 6–20)
CALCIUM: 9.6 mg/dL (ref 8.9–10.3)
CO2: 22 mmol/L (ref 22–32)
CREATININE: 1.07 mg/dL (ref 0.61–1.24)
Chloride: 101 mmol/L (ref 101–111)
GFR calc Af Amer: >60 mL/min (ref >60)
GFR calc non Af Amer: >60 mL/min (ref >60)
GLUCOSE: 186 mg/dL — AB (ref 65–99)
Potassium: 4.6 mmol/L (ref 3.5–5.1)
Sodium: 132 mmol/L — ABNORMAL LOW (ref 135–145)

## 2016-07-01 LAB — CBC
HCT: 38 % — ABNORMAL LOW (ref 39.0–52.0)
HEMOGLOBIN: 13.1 g/dL (ref 13.0–17.0)
MCH: 30.5 pg (ref 26.0–34.0)
MCHC: 34.5 g/dL (ref 30.0–36.0)
MCV: 88.4 fL (ref 78.0–100.0)
Platelets: 143 10*3/uL — ABNORMAL LOW (ref 150–400)
RBC: 4.3 MIL/uL (ref 4.22–5.81)
RDW: 13.4 % (ref 11.5–15.5)
WBC: 5.7 10*3/uL (ref 4.0–10.5)

## 2016-07-01 LAB — GLUCOSE, CAPILLARY: GLUCOSE-CAPILLARY: 247 mg/dL — AB (ref 65–99)

## 2016-07-02 LAB — HEMOGLOBIN A1C
Hgb A1c MFr Bld: 10.3 % — ABNORMAL HIGH (ref 4.8–5.6)
Mean Plasma Glucose: 249 mg/dL

## 2016-07-04 NOTE — Progress Notes (Signed)
HGBa1C done 07/01/16- routed via EPIC to Dr Louis Meckel.

## 2016-07-06 DIAGNOSIS — I82531 Chronic embolism and thrombosis of right popliteal vein: Secondary | ICD-10-CM | POA: Diagnosis not present

## 2016-07-06 NOTE — Progress Notes (Signed)
Last H and P 05/2016 with Dr Scot Dock.

## 2016-07-06 NOTE — Progress Notes (Signed)
Requested on 07/01/2016 and 07/06/2016- recent office visit notes, labs, etc from office of Dr Sheryn Bison with faxed confirmation x 2 with no response.

## 2016-07-07 ENCOUNTER — Ambulatory Visit (HOSPITAL_COMMUNITY): Payer: Medicare Other | Admitting: Anesthesiology

## 2016-07-07 ENCOUNTER — Encounter (HOSPITAL_COMMUNITY): Payer: Self-pay

## 2016-07-07 ENCOUNTER — Ambulatory Visit (HOSPITAL_COMMUNITY)
Admission: RE | Admit: 2016-07-07 | Discharge: 2016-07-07 | Disposition: A | Discharge status: Home / Self Care | Payer: Medicare Other | Source: Ambulatory Visit | Attending: Urology | Admitting: Urology

## 2016-07-07 ENCOUNTER — Encounter (HOSPITAL_COMMUNITY)
Admission: RE | Disposition: A | Discharge status: Home / Self Care | Payer: Self-pay | Source: Ambulatory Visit | Attending: Urology

## 2016-07-07 DIAGNOSIS — E119 Type 2 diabetes mellitus without complications: Secondary | ICD-10-CM | POA: Diagnosis not present

## 2016-07-07 DIAGNOSIS — C679 Malignant neoplasm of bladder, unspecified: Secondary | ICD-10-CM | POA: Insufficient documentation

## 2016-07-07 DIAGNOSIS — I82509 Chronic embolism and thrombosis of unspecified deep veins of unspecified lower extremity: Secondary | ICD-10-CM | POA: Diagnosis not present

## 2016-07-07 DIAGNOSIS — Z9221 Personal history of antineoplastic chemotherapy: Secondary | ICD-10-CM | POA: Diagnosis not present

## 2016-07-07 DIAGNOSIS — I82409 Acute embolism and thrombosis of unspecified deep veins of unspecified lower extremity: Secondary | ICD-10-CM | POA: Diagnosis not present

## 2016-07-07 DIAGNOSIS — R0600 Dyspnea, unspecified: Secondary | ICD-10-CM | POA: Diagnosis not present

## 2016-07-07 DIAGNOSIS — Z880 Allergy status to penicillin: Secondary | ICD-10-CM | POA: Diagnosis not present

## 2016-07-07 DIAGNOSIS — D494 Neoplasm of unspecified behavior of bladder: Secondary | ICD-10-CM | POA: Diagnosis not present

## 2016-07-07 DIAGNOSIS — K219 Gastro-esophageal reflux disease without esophagitis: Secondary | ICD-10-CM | POA: Diagnosis not present

## 2016-07-07 DIAGNOSIS — R911 Solitary pulmonary nodule: Secondary | ICD-10-CM | POA: Diagnosis not present

## 2016-07-07 DIAGNOSIS — Z87891 Personal history of nicotine dependence: Secondary | ICD-10-CM | POA: Insufficient documentation

## 2016-07-07 DIAGNOSIS — Z7901 Long term (current) use of anticoagulants: Secondary | ICD-10-CM | POA: Diagnosis not present

## 2016-07-07 DIAGNOSIS — E785 Hyperlipidemia, unspecified: Secondary | ICD-10-CM | POA: Diagnosis not present

## 2016-07-07 DIAGNOSIS — C672 Malignant neoplasm of lateral wall of bladder: Secondary | ICD-10-CM | POA: Diagnosis not present

## 2016-07-07 DIAGNOSIS — Z8249 Family history of ischemic heart disease and other diseases of the circulatory system: Secondary | ICD-10-CM | POA: Insufficient documentation

## 2016-07-07 HISTORY — PX: TRANSURETHRAL RESECTION OF BLADDER TUMOR: SHX2575

## 2016-07-07 LAB — GLUCOSE, CAPILLARY
GLUCOSE-CAPILLARY: 291 mg/dL — AB (ref 65–99)
Glucose-Capillary: 278 mg/dL — ABNORMAL HIGH (ref 65–99)
Glucose-Capillary: 276 mg/dL — ABNORMAL HIGH (ref 65–99)

## 2016-07-07 SURGERY — TURBT (TRANSURETHRAL RESECTION OF BLADDER TUMOR)
Anesthesia: General | Laterality: Bilateral

## 2016-07-07 MED ORDER — CIPROFLOXACIN IN D5W 400 MG/200ML IV SOLN
INTRAVENOUS | Status: AC
  Filled 2016-07-07: qty 200

## 2016-07-07 MED ORDER — OXYCODONE HCL 5 MG PO TABS: 5.0000 mg | ORAL_TABLET | Freq: Once | ORAL | Status: DC | PRN

## 2016-07-07 MED ORDER — ONDANSETRON HCL 4 MG/2ML IJ SOLN
INTRAMUSCULAR | Status: AC
  Filled 2016-07-07: qty 2

## 2016-07-07 MED ORDER — FENTANYL CITRATE (PF) 100 MCG/2ML IJ SOLN
INTRAMUSCULAR | Status: AC
  Filled 2016-07-07: qty 2

## 2016-07-07 MED ORDER — SODIUM CHLORIDE 0.9 % IR SOLN
Status: DC | PRN
  Administered 2016-07-07: 6000 mL

## 2016-07-07 MED ORDER — INSULIN ASPART 100 UNIT/ML ~~LOC~~ SOLN
SUBCUTANEOUS | Status: AC
  Filled 2016-07-07: qty 1

## 2016-07-07 MED ORDER — FENTANYL CITRATE (PF) 100 MCG/2ML IJ SOLN
INTRAMUSCULAR | Status: DC | PRN
  Administered 2016-07-07: 50 ug via INTRAVENOUS
  Administered 2016-07-07: 25 ug via INTRAVENOUS

## 2016-07-07 MED ORDER — 0.9 % SODIUM CHLORIDE (POUR BTL) OPTIME
TOPICAL | Status: DC | PRN
  Administered 2016-07-07: 1000 mL

## 2016-07-07 MED ORDER — PROPOFOL 10 MG/ML IV BOLUS
INTRAVENOUS | Status: DC | PRN
  Administered 2016-07-07: 130 mg via INTRAVENOUS
  Administered 2016-07-07: 20 mg via INTRAVENOUS

## 2016-07-07 MED ORDER — OXYCODONE HCL 5 MG/5ML PO SOLN: 5.0000 mg | Freq: Once | ORAL | Status: DC | PRN

## 2016-07-07 MED ORDER — INSULIN ASPART 100 UNIT/ML ~~LOC~~ SOLN
8.0000 [IU] | Freq: Once | SUBCUTANEOUS | Status: AC
  Administered 2016-07-07: 8 [IU] via SUBCUTANEOUS

## 2016-07-07 MED ORDER — LACTATED RINGERS IV SOLN
INTRAVENOUS | Status: DC
  Administered 2016-07-07: 11:00:00 via INTRAVENOUS

## 2016-07-07 MED ORDER — BELLADONNA ALKALOIDS-OPIUM 16.2-60 MG RE SUPP
RECTAL | Status: AC
  Filled 2016-07-07: qty 1

## 2016-07-07 MED ORDER — LIDOCAINE 2% (20 MG/ML) 5 ML SYRINGE
INTRAMUSCULAR | Status: AC
  Filled 2016-07-07: qty 5

## 2016-07-07 MED ORDER — TRAMADOL HCL 50 MG PO TABS: 50.0000 mg | ORAL_TABLET | Freq: Four times a day (QID) | ORAL | 0 refills | Status: DC | PRN

## 2016-07-07 MED ORDER — LIDOCAINE HCL 2 % EX GEL
CUTANEOUS | Status: AC
  Filled 2016-07-07: qty 5

## 2016-07-07 MED ORDER — PHENAZOPYRIDINE HCL 200 MG PO TABS: 200.0000 mg | ORAL_TABLET | Freq: Three times a day (TID) | ORAL | 0 refills | Status: DC | PRN

## 2016-07-07 MED ORDER — BELLADONNA ALKALOIDS-OPIUM 16.2-60 MG RE SUPP
RECTAL | Status: DC | PRN
  Administered 2016-07-07: 1 via RECTAL

## 2016-07-07 MED ORDER — FENTANYL CITRATE (PF) 100 MCG/2ML IJ SOLN: 25.0000 ug | INTRAMUSCULAR | Status: DC | PRN

## 2016-07-07 MED ORDER — PROPOFOL 10 MG/ML IV BOLUS
INTRAVENOUS | Status: AC
  Filled 2016-07-07: qty 20

## 2016-07-07 MED ORDER — IOHEXOL 300 MG/ML  SOLN
INTRAMUSCULAR | Status: DC | PRN
  Administered 2016-07-07: 17 mL

## 2016-07-07 MED ORDER — LIDOCAINE HCL (CARDIAC) 20 MG/ML IV SOLN
INTRAVENOUS | Status: DC | PRN
  Administered 2016-07-07: 60 mg via INTRAVENOUS

## 2016-07-07 MED ORDER — CIPROFLOXACIN IN D5W 400 MG/200ML IV SOLN
400.0000 mg | INTRAVENOUS | Status: AC
  Administered 2016-07-07: 400 mg via INTRAVENOUS

## 2016-07-07 MED ORDER — ONDANSETRON HCL 4 MG/2ML IJ SOLN
INTRAMUSCULAR | Status: DC | PRN
  Administered 2016-07-07: 4 mg via INTRAVENOUS

## 2016-07-07 MED ORDER — LIDOCAINE HCL 2 % EX GEL
CUTANEOUS | Status: DC | PRN
  Administered 2016-07-07: 1 via URETHRAL

## 2016-07-07 SURGICAL SUPPLY — 20 items
BAG URINE DRAINAGE (UROLOGICAL SUPPLIES) IMPLANT
BAG URO CATCHER STRL LF (MISCELLANEOUS) ×3 IMPLANT
CATH FOLEY 2WAY SLVR 30CC 24FR (CATHETERS) IMPLANT
CATH INTERMIT  6FR 70CM (CATHETERS) ×3 IMPLANT
ELECT REM PT RETURN 9FT ADLT (ELECTROSURGICAL) ×3
ELECTRODE REM PT RTRN 9FT ADLT (ELECTROSURGICAL) ×1 IMPLANT
EVACUATOR MICROVAS BLADDER (UROLOGICAL SUPPLIES) IMPLANT
GLOVE BIOGEL M 8.0 STRL (GLOVE) ×3 IMPLANT
GOWN STRL REUS W/ TWL XL LVL3 (GOWN DISPOSABLE) ×1 IMPLANT
GOWN STRL REUS W/TWL XL LVL3 (GOWN DISPOSABLE) ×5 IMPLANT
LOOP CUT BIPOLAR 24F LRG (ELECTROSURGICAL) ×3 IMPLANT
MANIFOLD NEPTUNE II (INSTRUMENTS) ×3 IMPLANT
NDL SAFETY ECLIPSE 18X1.5 (NEEDLE) ×1 IMPLANT
NEEDLE HYPO 18GX1.5 SHARP (NEEDLE) ×2
PACK CYSTO (CUSTOM PROCEDURE TRAY) ×3 IMPLANT
SET ASPIRATION TUBING (TUBING) IMPLANT
SYRINGE IRR TOOMEY STRL 70CC (SYRINGE) ×3 IMPLANT
TUBING CONNECTING 10 (TUBING) ×2 IMPLANT
TUBING CONNECTING 10' (TUBING) ×1
WATER STERILE IRR 3000ML UROMA (IV SOLUTION) ×3 IMPLANT

## 2016-07-07 NOTE — Discharge Instructions (Signed)

## 2016-07-07 NOTE — Anesthesia Postprocedure Evaluation (Addendum)
Anesthesia Post Note  Patient: GINA MCGLADE  Procedure(s) Performed: Procedure(s) (LRB): TRANSURETHRAL RESECTION OF BLADDER TUMOR (TURBT), BILATERAL RETROGRADE (Bilateral)  Patient location during evaluation: PACU Anesthesia Type: General Level of consciousness: awake and alert Pain management: pain level controlled Vital Signs Assessment: post-procedure vital signs reviewed and stable Respiratory status: spontaneous breathing, nonlabored ventilation, respiratory function stable and patient connected to nasal cannula oxygen Cardiovascular status: blood pressure returned to baseline and stable Postop Assessment: no signs of nausea or vomiting Anesthetic complications: no       Last Vitals:  Vitals:   07/07/16 1330 07/07/16 1338  BP:  133/67  Pulse: (!) 51 (!) 54  Resp: 14 15  Temp:  36.8 C    Last Pain:  Vitals:   07/07/16 1011  TempSrc: Oral                 Jaray Boliver

## 2016-07-07 NOTE — Anesthesia Procedure Notes (Signed)
Procedure Name: LMA Insertion Date/Time: 07/07/2016 11:50 AM Performed by: Glory Buff Pre-anesthesia Checklist: Patient identified, Emergency Drugs available, Suction available and Patient being monitored Patient Re-evaluated:Patient Re-evaluated prior to inductionOxygen Delivery Method: Circle system utilized Preoxygenation: Pre-oxygenation with 100% oxygen Intubation Type: IV induction LMA: LMA with gastric port inserted LMA Size: 4.0 Number of attempts: 1 Placement Confirmation: positive ETCO2 Tube secured with: Tape Dental Injury: Teeth and Oropharynx as per pre-operative assessment

## 2016-07-07 NOTE — Transfer of Care (Signed)
Immediate Anesthesia Transfer of Care Note  Patient: [name]  Procedure(s) Performed: Procedure(s): TRANSURETHRAL RESECTION OF BLADDER TUMOR (TURBT), BILATERAL RETROGRADE (Bilateral)  Patient Location: PACU  Anesthesia Type:General  Level of Consciousness: awake, alert  and oriented  Airway & Oxygen Therapy: Patient Spontanous Breathing and Patient connected to face mask oxygen  Post-op Assessment: Report given to RN and Post -op Vital signs reviewed and stable  Post vital signs: Reviewed and stable  Last Vitals:  Vitals:   07/07/16 1011  BP: (!) 172/81  Pulse: 65  Resp: 18  Temp: 36.3 C    Last Pain:  Vitals:   07/07/16 1011  TempSrc: Oral         Complications: No apparent anesthesia complications

## 2016-07-07 NOTE — Op Note (Signed)
Preoperative diagnosis:  1. Recurrent high-grade bladder cancer   Postoperative diagnosis:  1. Same, located in the right lateral bladder wall  Procedure: 1. Transurethral resection of bladder tumor, 0.5-2.0 centimeters 2. Bilateral retrograde pyelograms with interpretation  Surgeon: Ardis Hughs, MD  Anesthesia: General  Complications: None  Intraoperative findings:  #1:10 mL of Omnipaque contrast was instilled into the patient's left distal ureter and retrograde pyelogram was obtained, and this demonstrated a normal LV ureter with no filling defects. The calyces were sharp. Right retrograde pyelogram was performed following in a similar fashion. The right retrograde pyelogram demonstrated a normal caliber ureter with no filling defects and sharp calyces. #2: The patient had 2 small papillary lesions in the right lateral wall. The remaining cystoscopic evaluation was normal.  EBL: Minimal  Specimens:  #1: Right bladder wall tumors #2: Bladder tumor base  Indication: [name] is a 71 y.o. patient with history of high-grade multifocal bladder cancer who has a recurrence of his cancer has discovered by routine cystoscopic surveillance..  After reviewing the management options for treatment, he elected to proceed with the above surgical procedure(s). We have discussed the potential benefits and risks of the procedure, side effects of the proposed treatment, the likelihood of the patient achieving the goals of the procedure, and any potential problems that might occur during the procedure or recuperation. Informed consent has been obtained.  Description of procedure:  The patient was taken to the operating room and general anesthesia was induced.  The patient was placed in the dorsal lithotomy position, prepped and draped in the usual sterile fashion, and preoperative antibiotics were administered. A preoperative time-out was performed.   A 30 Wendy 1 [place] rigid  cystoscope was gently passed to the patient's urethra and into the bladder under visual guidance. This was after dilating the patient's urethral meatus up to 26 [place]. A 360 cystoscopic evaluation was performed noting the 2 small tumors on the right lateral wall. There were no other mucosal abnormalities. The ureteral orifices were orthotopic with clear reflux. The bladder neck was elevated. Patient's bladder capacity was reasonably normal. I then performed retrograde pyelograms using a 6 French open-ended ureteral catheter beginning on the patient's left. The above findings were noted. The performed with fluoroscopy. On the right side. The above findings were all noted for the patient's right retrograde pyelogram.  I then exchanged the 21 French cystoscopic sheath for the 26 French resectoscope sheath and passed this into the patient's bladder under visual guidance. Then exchanged the obturator for the resectoscope loop and removed both tumors on the lateral wall. I then evacuated these specimens and took a deeper bite the largest tumor sitting this separately for deep tissue margin. I then copiously fulgurated the area through sure excellent hemostasis. The bladder was subsequently emptied and the scope was removed.  Exam under anesthesia was performed and the patient's prostate was noted to be of normal size, symmetric, without nodularity. There are no bladder masses.  Disposition: The patient was subsequently extubated return to the PACU in stable condition. He is discharged home following the procedure. Ardis Hughs, M.D.

## 2016-07-07 NOTE — Anesthesia Preprocedure Evaluation (Addendum)
Anesthesia Evaluation  Patient identified by MRN, date of birth, ID band Patient awake    Reviewed: Allergy & Precautions, NPO status , Patient's Chart, lab work & pertinent test results  History of Anesthesia Complications Negative for: history of anesthetic complications  Airway Mallampati: IV  TM Distance: >3 FB Neck ROM: Full  Mouth opening: Limited Mouth Opening  Dental  (+) Teeth Intact   Pulmonary former smoker,    breath sounds clear to auscultation       Cardiovascular negative cardio ROS   Rhythm:Regular Rate:Normal     Neuro/Psych    GI/Hepatic Neg liver ROS, GERD  ,  Endo/Other  diabetes  Renal/GU negative Renal ROS     Musculoskeletal   Abdominal   Peds  Hematology   Anesthesia Other Findings   Reproductive/Obstetrics                            Anesthesia Physical Anesthesia Plan  ASA: II  Anesthesia Plan: General   Post-op Pain Management:    Induction: Intravenous  Airway Management Planned: LMA  Additional Equipment: None  Intra-op Plan:   Post-operative Plan: Extubation in OR  Informed Consent: I have reviewed the patients History and Physical, chart, labs and discussed the procedure including the risks, benefits and alternatives for the proposed anesthesia with the patient or authorized representative who has indicated his/her understanding and acceptance.   Dental advisory given  Plan Discussed with: CRNA and Surgeon  Anesthesia Plan Comments:         Anesthesia Quick Evaluation

## 2016-07-07 NOTE — H&P (Signed)
f/u for transitional cell carcinoma HPI: [name] is a 71 year-old male established patient who is here for surveillance of bladder cancer.  He underwent a TURBT. His last bladder tumor was resected 08/27/2015. He has had the a total of 1 bladder resections.   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   He had treatment with the following intravesical agents: bcg. The patient underwent induction therapy. The patient's last treatment was 11/23/2015. He underwent 1 rounds of maintenance therapy.   His last cysto was 01/31/2016. His cystoscopy demonstrated Sept 2017 - NED.   His last radiologic test to evaluate the kidneys was 08/27/2015. Imaging results: b/l RPG - nml. The patient did not have labs prior to his office visit today.   He is not having pain in new locations. He has not had blood in his urine recently. He has not recently had unwanted weight loss.   Imaging:  08/27/15 - b/l retrogrades normal  Chest CT 09/2015 - multiple pulmonary nodules - 65mm largest nodule. Repeat in 3-6 months. If stable then repeat in 18-24 months.   Interval: Since the patient was last seen he was diagnosed with large blood clots in the right leg. He has been on Lovenox for 2 months and then started on liquids. His leg pain and swelling has improved. He was also recently diagnosed with a small PE. On his CT scan at that time there was no evidence of nodular growth.      ALLERGIES: Penicillins - Skin Rash   MEDICATIONS: DICLOFENAC SODIUM PO Daily  Eliquis 5 mg tablet  Glimepiride TABS Oral  MetFORMIN HCl TABS Oral  Simvastatin 10 MG Oral Tablet Oral    GU PSH: Bladder Instill AntiCA Agent - 03/09/2016, 03/02/2016, 02/24/2016, 11/23/2015, 11/16/2015, 11/09/2015 Cystoscopy - 02/08/2016 Cystoscopy TURBT >5 cm - 08/28/2015     PSH Notes: Cystoscopy With Fulguration Large Lesion (Over 5cm), Cataract Surgery, Gallbladder Surgery, Abdominal Surgery, Hand Repair  NON-GU  PSH: None       GU PMH: Bladder Cancer Lateral (Stable, Chronic), NED currently. Given extent of disease we will plan to continue with BCG maintenance therapy. - 02/08/2016 Bladder Cancer, overlapping sites, Malignant neoplasm of overlapping sites of bladder - 10/28/2015 Urinary Tract Inf, Unspec site, Pyuria - 10/05/2015 Other Disorders Of Bladder, Bladder mass - 2015-08-27    NON-GU PMH: Solitary pulmonary nodule (Stable), Calcified pulmonary nodule - recommendation is to repeat CT scan in 6 months (Oct 2017) - 02/08/2016, Lung nodule, solitary, - 10/23/2015 Encounter for antineoplastic chemotherapy (Stable) - 11/23/2015, - 11/09/2015 Encounter for general adult medical examination without abnormal findings, Encounter for preventive health examination - 08/27/2015 Personal history of other endocrine, nutritional and metabolic disease, History of hyperlipidemia - August 27, 2015, History of diabetes mellitus, - August 27, 2015 Personal history of other specified conditions, History of heartburn - 2015/08/27    FAMILY HISTORY: Death of family member - Runs In Family heart failure - Runs In Family   SOCIAL HISTORY: Marital Status: Married Current Smoking Status: Patient does not smoke anymore.  Light Drinker.  Drinks 3 caffeinated drinks per day.     Notes: Alcohol use, Married, Number of children, Never a smoker, Retired, Caffeine use   REVIEW OF SYSTEMS:    GU Review Male:  Patient reports get up at night to urinate. Patient denies frequent urination, hard to postpone urination, burning/ pain with urination, leakage of urine, stream starts and stops, trouble starting your stream, have to strain to urinate , erection  problems, and penile pain.   Gastrointestinal (Upper):  Patient denies nausea, vomiting, and indigestion/ heartburn.   Gastrointestinal (Lower):  Patient reports diarrhea. Patient denies constipation.   Constitutional:  Patient denies fever, night sweats, weight loss, and fatigue.   Skin:  Patient  reports itching. Patient denies skin rash/ lesion.   Eyes:  Patient denies blurred vision and double vision.   Ears/ Nose/ Throat:  Patient denies sore throat and sinus problems.   Hematologic/Lymphatic:  Patient reports easy bruising. Patient denies swollen glands.   Cardiovascular:  Patient reports leg swelling. Patient denies chest pains.   Respiratory:  Patient denies cough and shortness of breath.   Endocrine:  Patient reports excessive thirst.    Musculoskeletal:  Patient reports joint pain. Patient denies back pain.   Neurological:  Patient reports dizziness. Patient denies headaches.   Psychologic:  Patient denies depression and anxiety.   VITAL SIGNS:      06/06/2016 10:37 AM    Weight 194 lb / 88 kg    Height 72 in / 182.88 cm    BP 159/71 mmHg    Pulse 64 /min    BMI 26.3 kg/m    GU PHYSICAL EXAMINATION:     Anus and Perineum: No hemorrhoids. No anal stenosis. No rectal fissure, no anal fissure. No edema, no dimple, no perineal tenderness, no anal tenderness.    Scrotum: No lesions. No edema. No cysts. No warts.    Epididymides: Right: no spermatocele, no masses, no cysts, no tenderness, no induration, no enlargement. Left: no spermatocele, no masses, no cysts, no tenderness, no induration, no enlargement.    Testes: No tenderness, no swelling, no enlargement left testes. No tenderness, no swelling, no enlargement right testes. Normal location left testes. Normal location right testes. No mass, no cyst, no varicocele, no hydrocele left testes. No mass, no cyst, no varicocele, no hydrocele right testes.    Urethral Meatus: Normal size. No lesion, no wart, no discharge, no polyp. Normal location.    Penis: Circumcised, no warts, no cracks. No dorsal Peyronie's plaques, no left corporal Peyronie's plaques, no right corporal Peyronie's plaques, no scarring, no warts. No balanitis, no meatal stenosis.    MULTI-SYSTEM PHYSICAL EXAMINATION:                 PAST DATA  REVIEWED:  Source Of History:  Patient Records Review:  Pathology Reports, Previous Patient Records  PROCEDURES:   Flexible Cystoscopy - 52000 Risks, benefits, and some of the potential complications of the procedure were discussed at length with the patient including infection, bleeding, voiding discomfort, urinary retention, fever, chills, sepsis, and others. All questions were answered. Informed consent was obtained. Antibiotic prophylaxis was given. Sterile technique and intraurethral analgesia were used. Meatus:  Normal size. Normal location. Normal condition. Urethra:  No strictures. External Sphincter:  Normal. Verumontanum:  Normal. Prostate:  Non-obstructing. No hyperplasia. Bladder Neck:  Non-obstructing. Ureteral Orifices:  Normal location. Normal size. Normal shape. Effluxed clear urine. Bladder:  A few right lateral wall tumors. 1 lesion appears to be about 15 mm in size the second one directly behind it on the right lateral wall appears to be approximately 5 mm in size.   The lower urinary tract was carefully examined. The procedure was well-tolerated and without complications. Antibiotic instructions were given. Instructions were given to call the office immediately for bloody urine, difficulty urinating, urinary retention, painful or frequent urination, fever, chills, nausea, vomiting or other illness. The patient stated that he understood these  instructions and would comply with them.   Urinalysis - 81003  Dipstick Dipstick Cont'd Specimen: Voided Bilirubin: Neg Color: Yellow Ketones: Trace Appearance: Clear Blood: Neg Specific Gravity: 1.025 Protein: Neg pH: <=5.0 Urobilinogen: 0.2 Glucose: 3+ Nitrites: Neg  Leukocyte Esterase: Neg   ASSESSMENT:    ICD-10 Details 1 GU:  Bladder Cancer Lateral - C67.2   PLAN:  Orders  X-Rays: Outside Ultrasound Without Contrast - lower extremity duplex to evaluate for residual DVT. Patient has a history of right-sided  DVT. Schedule  Return Visit/Planned Activity: Next Available Appointment - Office Visit Return Notes: I will contact the patient with the results of his DVT study. We will proceed according to the results. Document  Letter(s):  Created for Patient: Clinical Summary  Notes:  The patient has recurrence of his bladder cancer. As such, he will need a repeat TURBT. However, this is complicated by the fact that he has DVTs and is on anticoagulation.   Our plan is to repeat the patient's duplex of the lower extremity and evaluate whether or not he has clots at this point. If the patient still has a large residual clots he will need a filter prior to proceeding with TURBT. If the clots of dissolved, we would likely switch him to Lovenox and then have the patient stop the medication one day prior and then resume that night.

## 2016-07-08 ENCOUNTER — Encounter (HOSPITAL_COMMUNITY): Payer: Self-pay | Admitting: Urology

## 2016-07-25 DIAGNOSIS — H11053 Peripheral pterygium, progressive, bilateral: Secondary | ICD-10-CM | POA: Diagnosis not present

## 2016-07-25 DIAGNOSIS — E119 Type 2 diabetes mellitus without complications: Secondary | ICD-10-CM | POA: Diagnosis not present

## 2016-07-25 DIAGNOSIS — Z7984 Long term (current) use of oral hypoglycemic drugs: Secondary | ICD-10-CM | POA: Diagnosis not present

## 2016-07-25 DIAGNOSIS — Z961 Presence of intraocular lens: Secondary | ICD-10-CM | POA: Diagnosis not present

## 2016-08-10 DIAGNOSIS — C672 Malignant neoplasm of lateral wall of bladder: Secondary | ICD-10-CM | POA: Diagnosis not present

## 2016-08-10 DIAGNOSIS — Z5111 Encounter for antineoplastic chemotherapy: Secondary | ICD-10-CM | POA: Diagnosis not present

## 2016-08-17 DIAGNOSIS — R8271 Bacteriuria: Secondary | ICD-10-CM | POA: Diagnosis not present

## 2016-08-31 DIAGNOSIS — C672 Malignant neoplasm of lateral wall of bladder: Secondary | ICD-10-CM | POA: Diagnosis not present

## 2016-08-31 DIAGNOSIS — Z5111 Encounter for antineoplastic chemotherapy: Secondary | ICD-10-CM | POA: Diagnosis not present

## 2016-09-06 ENCOUNTER — Ambulatory Visit (INDEPENDENT_AMBULATORY_CARE_PROVIDER_SITE_OTHER): Payer: Medicare Other | Admitting: Podiatry

## 2016-09-06 ENCOUNTER — Encounter: Payer: Self-pay | Admitting: Podiatry

## 2016-09-06 DIAGNOSIS — B351 Tinea unguium: Secondary | ICD-10-CM | POA: Diagnosis not present

## 2016-09-06 DIAGNOSIS — E1142 Type 2 diabetes mellitus with diabetic polyneuropathy: Secondary | ICD-10-CM

## 2016-09-06 NOTE — Progress Notes (Signed)
Patient ID: [name], male   DOB: Sep 22, 1945, 71 y.o.   MRN: [id number] Complaint:  Visit Type: Patient returns to my office for continued preventative foot care services. Complaint: Patient states" my nails have grown long and thick and become painful to walk and wear shoes" Patient has been diagnosed with DM with no foot complications. The patient presents for preventative foot care services. No changes to ROS.  Patient healing following nail surgery both big toes.  Podiatric Exam: Vascular: dorsalis pedis and posterior tibial pulses are palpable bilateral. Capillary return is immediate. Temperature gradient is WNL. Skin turgor WNL  Sensorium: Normal Semmes Weinstein monofilament test. Normal tactile sensation bilaterally. Nail Exam: Pt has thick disfigured discolored nails with subungual debris noted bilateral entire nail hallux through fifth toenails Ulcer Exam: There is no evidence of ulcer or pre-ulcerative changes or infection. Orthopedic Exam: Muscle tone and strength are WNL. No limitations in general ROM. No crepitus or effusions noted. Foot type and digits show no abnormalities. Bony prominences are unremarkable. Skin: No Porokeratosis. No infection or ulcers  Diagnosis:  Onychomycosis, , Pain in right toe, pain in left toes  Treatment & Plan Procedures and Treatment: Consent by patient was obtained for treatment procedures. The patient understood the discussion of treatment and procedures well. All questions were answered thoroughly reviewed. Debridement of mycotic and hypertrophic toenails, 1 through 5 bilateral and clearing of subungual debris. No ulceration, no infection noted.  Return Visit-Office Procedure: Patient instructed to return to the office for a follow up visit  Prn since he is going to [place].   Gardiner Barefoot DPM

## 2016-09-07 DIAGNOSIS — C672 Malignant neoplasm of lateral wall of bladder: Secondary | ICD-10-CM | POA: Diagnosis not present

## 2016-09-07 DIAGNOSIS — Z5111 Encounter for antineoplastic chemotherapy: Secondary | ICD-10-CM | POA: Diagnosis not present

## 2016-09-14 DIAGNOSIS — Z5111 Encounter for antineoplastic chemotherapy: Secondary | ICD-10-CM | POA: Diagnosis not present

## 2016-09-14 DIAGNOSIS — C672 Malignant neoplasm of lateral wall of bladder: Secondary | ICD-10-CM | POA: Diagnosis not present

## 2016-09-15 DIAGNOSIS — L570 Actinic keratosis: Secondary | ICD-10-CM | POA: Diagnosis not present

## 2016-09-15 DIAGNOSIS — L219 Seborrheic dermatitis, unspecified: Secondary | ICD-10-CM | POA: Diagnosis not present

## 2016-09-15 DIAGNOSIS — D229 Melanocytic nevi, unspecified: Secondary | ICD-10-CM | POA: Diagnosis not present

## 2016-09-21 DIAGNOSIS — Z5111 Encounter for antineoplastic chemotherapy: Secondary | ICD-10-CM | POA: Diagnosis not present

## 2016-09-21 DIAGNOSIS — C672 Malignant neoplasm of lateral wall of bladder: Secondary | ICD-10-CM | POA: Diagnosis not present

## 2016-10-31 NOTE — Addendum Note (Signed)
Addendum  created 10/31/16 1115 by Oleta Mouse, MD   Sign clinical note

## 2016-12-20 DIAGNOSIS — E785 Hyperlipidemia, unspecified: Secondary | ICD-10-CM | POA: Diagnosis not present

## 2016-12-20 DIAGNOSIS — Z79899 Other long term (current) drug therapy: Secondary | ICD-10-CM | POA: Diagnosis not present

## 2016-12-20 DIAGNOSIS — E119 Type 2 diabetes mellitus without complications: Secondary | ICD-10-CM | POA: Diagnosis not present

## 2016-12-20 DIAGNOSIS — R030 Elevated blood-pressure reading, without diagnosis of hypertension: Secondary | ICD-10-CM | POA: Diagnosis not present

## 2016-12-20 DIAGNOSIS — Z7984 Long term (current) use of oral hypoglycemic drugs: Secondary | ICD-10-CM | POA: Diagnosis not present

## 2016-12-21 DIAGNOSIS — Z7984 Long term (current) use of oral hypoglycemic drugs: Secondary | ICD-10-CM | POA: Diagnosis not present

## 2016-12-21 DIAGNOSIS — Z79899 Other long term (current) drug therapy: Secondary | ICD-10-CM | POA: Diagnosis not present

## 2016-12-21 DIAGNOSIS — E119 Type 2 diabetes mellitus without complications: Secondary | ICD-10-CM | POA: Diagnosis not present

## 2016-12-21 DIAGNOSIS — E785 Hyperlipidemia, unspecified: Secondary | ICD-10-CM | POA: Diagnosis not present

## 2017-01-03 DIAGNOSIS — C678 Malignant neoplasm of overlapping sites of bladder: Secondary | ICD-10-CM | POA: Diagnosis not present

## 2017-01-09 DIAGNOSIS — I2699 Other pulmonary embolism without acute cor pulmonale: Secondary | ICD-10-CM | POA: Diagnosis not present

## 2017-01-09 DIAGNOSIS — E7800 Pure hypercholesterolemia, unspecified: Secondary | ICD-10-CM | POA: Diagnosis not present

## 2017-01-09 DIAGNOSIS — E119 Type 2 diabetes mellitus without complications: Secondary | ICD-10-CM | POA: Diagnosis not present

## 2017-01-09 DIAGNOSIS — C679 Malignant neoplasm of bladder, unspecified: Secondary | ICD-10-CM | POA: Diagnosis not present

## 2017-01-09 DIAGNOSIS — I82409 Acute embolism and thrombosis of unspecified deep veins of unspecified lower extremity: Secondary | ICD-10-CM | POA: Diagnosis not present

## 2017-01-25 DIAGNOSIS — Z5111 Encounter for antineoplastic chemotherapy: Secondary | ICD-10-CM | POA: Diagnosis not present

## 2017-01-25 DIAGNOSIS — C678 Malignant neoplasm of overlapping sites of bladder: Secondary | ICD-10-CM | POA: Diagnosis not present

## 2017-02-01 DIAGNOSIS — C678 Malignant neoplasm of overlapping sites of bladder: Secondary | ICD-10-CM | POA: Diagnosis not present

## 2017-02-01 DIAGNOSIS — Z5111 Encounter for antineoplastic chemotherapy: Secondary | ICD-10-CM | POA: Diagnosis not present

## 2017-02-01 DIAGNOSIS — C672 Malignant neoplasm of lateral wall of bladder: Secondary | ICD-10-CM | POA: Diagnosis not present

## 2017-02-08 DIAGNOSIS — C678 Malignant neoplasm of overlapping sites of bladder: Secondary | ICD-10-CM | POA: Diagnosis not present

## 2017-02-08 DIAGNOSIS — Z5111 Encounter for antineoplastic chemotherapy: Secondary | ICD-10-CM | POA: Diagnosis not present

## 2017-02-13 ENCOUNTER — Ambulatory Visit (INDEPENDENT_AMBULATORY_CARE_PROVIDER_SITE_OTHER): Payer: Medicare Other | Admitting: Podiatry

## 2017-02-13 ENCOUNTER — Encounter: Payer: Self-pay | Admitting: Podiatry

## 2017-02-13 DIAGNOSIS — E1142 Type 2 diabetes mellitus with diabetic polyneuropathy: Secondary | ICD-10-CM | POA: Diagnosis not present

## 2017-02-13 DIAGNOSIS — B351 Tinea unguium: Secondary | ICD-10-CM

## 2017-02-13 NOTE — Progress Notes (Signed)
Patient ID: [name], male   DOB: 15-Apr-1946, 71 y.o.   MRN: [id number]   Subjective: This diabetic patient presents today requesting that his toenails are thick and long any request toenail debridement. Patient has a history of phenol matricectomy to the hallux nail. Patient is diabetic and denies history of foot ulceration, claudication or amputation Patient complains of intermittent tingling in his feet particularly at night Patient former smoker discontinued 30 years prior  Objective: Patient is approximately 6 feet and weighs approximately 194 pounds DP and PT pulses 2/4 bilaterally Capillary reflex within normal limits bilaterally Sensation to 10 g monofilament wire intact 8/9 right 7/9 left Vibratory sensation reactive bilaterally Ankle reflexes reactive bilaterally No skin lesions bilaterally Absent hair growth bilaterally The toenails elongated with occasional texture and color changes 6-10 This is no restriction or crepitus in range of motion ankle, subtalar, midtarsal joints bilaterally Manual motor testing dorsi flexion, plantar flexion 5/5 bilaterally  Assessment: Diabetic with peripheral neuropathy Mycotic toenails with minimal deformity  Plan: Debrided toenails 6-10 mechanically and electronically without a bleeding Made patient aware that he could choose a pedicures to trim his toenails and not to place feet and whirlpool or manipulate cuticles  Reappoint when necessary or yearly for diabetic foot exam

## 2017-02-13 NOTE — Patient Instructions (Signed)
Your toenails are relatively normal in texture and appearance If you choose use: Pedicurist to have the nails trimmed Do not place feet and whirlpool Do not manipulate cuticles  Diabetes and Foot Care Diabetes may cause you to have problems because of poor blood supply (circulation) to your feet and legs. This may cause the skin on your feet to become thinner, break easier, and heal more slowly. Your skin may become dry, and the skin may peel and crack. You may also have nerve damage in your legs and feet causing decreased feeling in them. You may not notice minor injuries to your feet that could lead to infections or more serious problems. Taking care of your feet is one of the most important things you can do for yourself. Follow these instructions at home:  Wear shoes at all times, even in the house. Do not go barefoot. Bare feet are easily injured.  Check your feet daily for blisters, cuts, and redness. If you cannot see the bottom of your feet, use a mirror or ask someone for help.  Wash your feet with warm water (do not use hot water) and mild soap. Then pat your feet and the areas between your toes until they are completely dry. Do not soak your feet as this can dry your skin.  Apply a moisturizing lotion or petroleum jelly (that does not contain alcohol and is unscented) to the skin on your feet and to dry, brittle toenails. Do not apply lotion between your toes.  Trim your toenails straight across. Do not dig under them or around the cuticle. File the edges of your nails with an emery board or nail file.  Do not cut corns or calluses or try to remove them with medicine.  Wear clean socks or stockings every day. Make sure they are not too tight. Do not wear knee-high stockings since they may decrease blood flow to your legs.  Wear shoes that fit properly and have enough cushioning. To break in new shoes, wear them for just a few hours a day. This prevents you from injuring your feet.  Always look in your shoes before you put them on to be sure there are no objects inside.  Do not cross your legs. This may decrease the blood flow to your feet.  If you find a minor scrape, cut, or break in the skin on your feet, keep it and the skin around it clean and dry. These areas may be cleansed with mild soap and water. Do not cleanse the area with peroxide, alcohol, or iodine.  When you remove an adhesive bandage, be sure not to damage the skin around it.  If you have a wound, look at it several times a day to make sure it is healing.  Do not use heating pads or hot water bottles. They may burn your skin. If you have lost feeling in your feet or legs, you may not know it is happening until it is too late.  Make sure your health care provider performs a complete foot exam at least annually or more often if you have foot problems. Report any cuts, sores, or bruises to your health care provider immediately. Contact a health care provider if:  You have an injury that is not healing.  You have cuts or breaks in the skin.  You have an ingrown nail.  You notice redness on your legs or feet.  You feel burning or tingling in your legs or feet.  You have  pain or cramps in your legs and feet.  Your legs or feet are numb.  Your feet always feel cold. Get help right away if:  There is increasing redness, swelling, or pain in or around a wound.  There is a red line that goes up your leg.  Pus is coming from a wound.  You develop a fever or as directed by your health care provider.  You notice a bad smell coming from an ulcer or wound. This information is not intended to replace advice given to you by your health care provider. Make sure you discuss any questions you have with your health care provider. Document Released: 05/13/2000 Document Revised: 10/22/2015 Document Reviewed: 10/23/2012 Elsevier Interactive Patient Education  2017 [employer].

## 2017-03-13 DIAGNOSIS — Z0000 Encounter for general adult medical examination without abnormal findings: Secondary | ICD-10-CM | POA: Diagnosis not present

## 2017-03-13 DIAGNOSIS — I82401 Acute embolism and thrombosis of unspecified deep veins of right lower extremity: Secondary | ICD-10-CM | POA: Diagnosis not present

## 2017-03-13 DIAGNOSIS — Z1389 Encounter for screening for other disorder: Secondary | ICD-10-CM | POA: Diagnosis not present

## 2017-03-13 DIAGNOSIS — Z23 Encounter for immunization: Secondary | ICD-10-CM | POA: Diagnosis not present

## 2017-03-13 DIAGNOSIS — E7800 Pure hypercholesterolemia, unspecified: Secondary | ICD-10-CM | POA: Diagnosis not present

## 2017-03-13 DIAGNOSIS — Z794 Long term (current) use of insulin: Secondary | ICD-10-CM | POA: Diagnosis not present

## 2017-03-13 DIAGNOSIS — E119 Type 2 diabetes mellitus without complications: Secondary | ICD-10-CM | POA: Diagnosis not present

## 2017-03-31 ENCOUNTER — Telehealth: Payer: Self-pay | Admitting: *Deleted

## 2017-03-31 ENCOUNTER — Encounter: Payer: Self-pay | Admitting: *Deleted

## 2017-03-31 NOTE — Telephone Encounter (Signed)
-----   Message from Tanda Rockers, MD sent at 06/11/2016  2:04 PM EST ----- Ct s contrast f/u mpds

## 2017-03-31 NOTE — Telephone Encounter (Signed)
Unable to reach the pt by telephone  The number we have listed was called, and the person whom answered says that this is the wrong number  Letter mailed to have him contact us to have CT
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
## 2017-04-25 ENCOUNTER — Encounter: Payer: Self-pay | Admitting: Hematology

## 2017-04-25 ENCOUNTER — Ambulatory Visit (HOSPITAL_BASED_OUTPATIENT_CLINIC_OR_DEPARTMENT_OTHER): Payer: Medicare Other | Admitting: Hematology

## 2017-04-25 VITALS — BP 180/70 | HR 58 | Temp 97.7°F | Resp 17 | Ht 72.0 in | Wt 199.1 lb

## 2017-04-25 DIAGNOSIS — Z86718 Personal history of other venous thrombosis and embolism: Secondary | ICD-10-CM | POA: Diagnosis not present

## 2017-04-25 DIAGNOSIS — C679 Malignant neoplasm of bladder, unspecified: Secondary | ICD-10-CM | POA: Diagnosis not present

## 2017-04-25 DIAGNOSIS — Z87891 Personal history of nicotine dependence: Secondary | ICD-10-CM | POA: Diagnosis not present

## 2017-04-25 DIAGNOSIS — I2699 Other pulmonary embolism without acute cor pulmonale: Secondary | ICD-10-CM | POA: Diagnosis not present

## 2017-04-25 DIAGNOSIS — I82421 Acute embolism and thrombosis of right iliac vein: Secondary | ICD-10-CM

## 2017-04-25 DIAGNOSIS — D6859 Other primary thrombophilia: Secondary | ICD-10-CM

## 2017-04-25 NOTE — Patient Instructions (Signed)
Thank you for choosing [facility] to provide your oncology and hematology care.  To afford each patient quality time with our providers, please arrive 30 minutes before your scheduled appointment time.  If you arrive late for your appointment, you may be asked to reschedule.  We strive to give you quality time with our providers, and arriving late affects you and other patients whose appointments are after yours.   If you are a no show for multiple scheduled visits, you may be dismissed from the clinic at the providers discretion.    Again, thank you for choosing [facility], our hope is that these requests will decrease the amount of time that you wait before being seen by our physicians.  ______________________________________________________________________  Should you have questions after your visit to the [facility], please contact our office at [phone] between the hours of 8:30 and 4:30 p.m.    Voicemails left after 4:30p.m will not be returned until the following business day.    For prescription refill requests, please have your pharmacy contact us directly.  Please also try to allow 48 hours for prescription requests.    Please contact the scheduling department for questions regarding scheduling.  For scheduling of procedures such as PET scans, CT scans, MRI, Ultrasound, etc please contact central scheduling at [phone].    Resources For Cancer Patients and Caregivers:   Oncolink.org:  A wonderful resource for patients and healthcare providers for information regarding your disease, ways to tract your treatment, what to expect, etc.     American Cancer Society:  800-227-2345  Can help patients locate various types of support and financial assistance  Cancer Care: 1-800-813-HOPE (4673) Provides financial assistance, online support groups, medication/co-pay assistance.    [place] DSS:  336-641-3447 Where to apply for food  stamps, Medicaid, and utility assistance  Medicare Rights Center: 800-333-4114 Helps people with Medicare understand their rights and benefits, navigate the Medicare system, and secure the quality healthcare they deserve  SCAT: 336-333-6589 [place] [employer]'s shared-ride transportation service for eligible riders who have a disability that prevents them from riding the fixed route bus.    For additional information on assistance programs please contact our social worker:   Grier Hock/Abigail Elmore:  [phone]            

## 2017-05-16 DIAGNOSIS — C679 Malignant neoplasm of bladder, unspecified: Secondary | ICD-10-CM | POA: Diagnosis not present

## 2017-05-16 DIAGNOSIS — C673 Malignant neoplasm of anterior wall of bladder: Secondary | ICD-10-CM | POA: Diagnosis not present

## 2017-08-29 DIAGNOSIS — R911 Solitary pulmonary nodule: Secondary | ICD-10-CM | POA: Diagnosis not present

## 2017-08-29 DIAGNOSIS — R918 Other nonspecific abnormal finding of lung field: Secondary | ICD-10-CM | POA: Diagnosis not present

## 2017-08-29 DIAGNOSIS — C678 Malignant neoplasm of overlapping sites of bladder: Secondary | ICD-10-CM | POA: Diagnosis not present

## 2017-08-30 DIAGNOSIS — E119 Type 2 diabetes mellitus without complications: Secondary | ICD-10-CM | POA: Diagnosis not present

## 2017-08-30 DIAGNOSIS — H11003 Unspecified pterygium of eye, bilateral: Secondary | ICD-10-CM | POA: Diagnosis not present

## 2017-08-30 DIAGNOSIS — C679 Malignant neoplasm of bladder, unspecified: Secondary | ICD-10-CM | POA: Diagnosis not present

## 2017-08-30 DIAGNOSIS — I10 Essential (primary) hypertension: Secondary | ICD-10-CM | POA: Diagnosis not present

## 2017-08-30 DIAGNOSIS — E7800 Pure hypercholesterolemia, unspecified: Secondary | ICD-10-CM | POA: Diagnosis not present

## 2017-08-30 DIAGNOSIS — H52203 Unspecified astigmatism, bilateral: Secondary | ICD-10-CM | POA: Diagnosis not present

## 2017-08-30 DIAGNOSIS — Z961 Presence of intraocular lens: Secondary | ICD-10-CM | POA: Diagnosis not present

## 2017-08-30 DIAGNOSIS — Z794 Long term (current) use of insulin: Secondary | ICD-10-CM | POA: Diagnosis not present

## 2017-08-30 DIAGNOSIS — I82531 Chronic embolism and thrombosis of right popliteal vein: Secondary | ICD-10-CM | POA: Diagnosis not present

## 2017-08-31 ENCOUNTER — Ambulatory Visit (INDEPENDENT_AMBULATORY_CARE_PROVIDER_SITE_OTHER): Payer: Medicare Other | Admitting: Podiatry

## 2017-08-31 ENCOUNTER — Encounter: Payer: Self-pay | Admitting: Podiatry

## 2017-08-31 DIAGNOSIS — M79676 Pain in unspecified toe(s): Secondary | ICD-10-CM

## 2017-08-31 DIAGNOSIS — B351 Tinea unguium: Secondary | ICD-10-CM | POA: Diagnosis not present

## 2017-08-31 DIAGNOSIS — E1142 Type 2 diabetes mellitus with diabetic polyneuropathy: Secondary | ICD-10-CM

## 2017-08-31 NOTE — Progress Notes (Signed)
He presents today for routine nail debridement.  Objective: Vital signs stable he is alert and oriented x3 pulses are palpable.  Neurologic sensorium is slightly diminished just to the toes.  Toenails are long thick yellow dystrophic clinically mycotic.  Small area of irritation lateral aspect of the fourth toe left foot.  There is no skin breakdown.  Assessment: Pain in limb secondary to diabetic peripheral neuropathy pain in limb secondary to onychomycosis.  Plan: Discussed etiology pathology conservative surgical therapies debrided toenails 1 through 5 bilaterally today follow-up with him on an as-needed basis.

## 2017-09-06 DIAGNOSIS — Z5111 Encounter for antineoplastic chemotherapy: Secondary | ICD-10-CM | POA: Diagnosis not present

## 2017-09-06 DIAGNOSIS — C678 Malignant neoplasm of overlapping sites of bladder: Secondary | ICD-10-CM | POA: Diagnosis not present

## 2017-09-08 ENCOUNTER — Ambulatory Visit: Payer: Medicare Other | Admitting: Podiatry

## 2017-09-13 DIAGNOSIS — C678 Malignant neoplasm of overlapping sites of bladder: Secondary | ICD-10-CM | POA: Diagnosis not present

## 2017-09-13 DIAGNOSIS — Z5111 Encounter for antineoplastic chemotherapy: Secondary | ICD-10-CM | POA: Diagnosis not present

## 2017-09-25 DIAGNOSIS — Z5111 Encounter for antineoplastic chemotherapy: Secondary | ICD-10-CM | POA: Diagnosis not present

## 2017-09-25 DIAGNOSIS — C678 Malignant neoplasm of overlapping sites of bladder: Secondary | ICD-10-CM | POA: Diagnosis not present

## 2017-12-28 ENCOUNTER — Ambulatory Visit (INDEPENDENT_AMBULATORY_CARE_PROVIDER_SITE_OTHER): Payer: Medicare Other | Admitting: Podiatry

## 2017-12-28 ENCOUNTER — Encounter: Payer: Self-pay | Admitting: Podiatry

## 2017-12-28 DIAGNOSIS — E1142 Type 2 diabetes mellitus with diabetic polyneuropathy: Secondary | ICD-10-CM

## 2017-12-28 DIAGNOSIS — B351 Tinea unguium: Secondary | ICD-10-CM

## 2017-12-28 DIAGNOSIS — M79676 Pain in unspecified toe(s): Secondary | ICD-10-CM | POA: Diagnosis not present

## 2017-12-28 DIAGNOSIS — D689 Coagulation defect, unspecified: Secondary | ICD-10-CM

## 2017-12-30 NOTE — Progress Notes (Signed)
[name] presents today with a chief complaint of painful elongated toenails diabetes he is not complaining of any foot pain.  Objective: Vital signs are stable he is alert and oriented x3.  Pulses are barely palpable but capillary fill time is immediate neurologic sensorium is slightly diminished per Semmes Weinstein monofilament deep tendon reflexes are intact muscle strength is normal symmetrical.  Orthopedic evaluation demonstrates all joints distal ankle full range of motion without crepitation.  Mild hammertoe deformities are noted bilateral but are flexible in nature.  Toenails are long thick yellow dystrophic-like mycotic no open lesions or wounds are noted.  Assessment: Diabetic peripheral neuropathy pain in limb secondary onychomycosis.  Plan: Debridement of toenails 1 through 5 bilateral cover service secondary to pain and diabetes.  He is also on blood thinner.

## 2018-03-05 DIAGNOSIS — C678 Malignant neoplasm of overlapping sites of bladder: Secondary | ICD-10-CM | POA: Diagnosis not present

## 2018-03-10 DIAGNOSIS — Z23 Encounter for immunization: Secondary | ICD-10-CM | POA: Diagnosis not present

## 2018-03-14 DIAGNOSIS — C678 Malignant neoplasm of overlapping sites of bladder: Secondary | ICD-10-CM | POA: Diagnosis not present

## 2018-03-14 DIAGNOSIS — Z5111 Encounter for antineoplastic chemotherapy: Secondary | ICD-10-CM | POA: Diagnosis not present

## 2018-03-16 DIAGNOSIS — Z1389 Encounter for screening for other disorder: Secondary | ICD-10-CM | POA: Diagnosis not present

## 2018-03-16 DIAGNOSIS — C679 Malignant neoplasm of bladder, unspecified: Secondary | ICD-10-CM | POA: Diagnosis not present

## 2018-03-16 DIAGNOSIS — E1169 Type 2 diabetes mellitus with other specified complication: Secondary | ICD-10-CM | POA: Diagnosis not present

## 2018-03-16 DIAGNOSIS — I10 Essential (primary) hypertension: Secondary | ICD-10-CM | POA: Diagnosis not present

## 2018-03-16 DIAGNOSIS — Z0000 Encounter for general adult medical examination without abnormal findings: Secondary | ICD-10-CM | POA: Diagnosis not present

## 2018-03-16 DIAGNOSIS — E7800 Pure hypercholesterolemia, unspecified: Secondary | ICD-10-CM | POA: Diagnosis not present

## 2018-03-16 DIAGNOSIS — Z23 Encounter for immunization: Secondary | ICD-10-CM | POA: Diagnosis not present

## 2018-03-16 DIAGNOSIS — Z794 Long term (current) use of insulin: Secondary | ICD-10-CM | POA: Diagnosis not present

## 2018-03-16 DIAGNOSIS — I739 Peripheral vascular disease, unspecified: Secondary | ICD-10-CM | POA: Diagnosis not present

## 2018-03-21 DIAGNOSIS — Z5111 Encounter for antineoplastic chemotherapy: Secondary | ICD-10-CM | POA: Diagnosis not present

## 2018-03-21 DIAGNOSIS — C678 Malignant neoplasm of overlapping sites of bladder: Secondary | ICD-10-CM | POA: Diagnosis not present

## 2018-03-28 DIAGNOSIS — Z5111 Encounter for antineoplastic chemotherapy: Secondary | ICD-10-CM | POA: Diagnosis not present

## 2018-03-28 DIAGNOSIS — C673 Malignant neoplasm of anterior wall of bladder: Secondary | ICD-10-CM | POA: Diagnosis not present

## 2018-03-29 ENCOUNTER — Ambulatory Visit (INDEPENDENT_AMBULATORY_CARE_PROVIDER_SITE_OTHER): Payer: Medicare Other | Admitting: Podiatry

## 2018-03-29 ENCOUNTER — Encounter: Payer: Self-pay | Admitting: Podiatry

## 2018-03-29 DIAGNOSIS — M79676 Pain in unspecified toe(s): Secondary | ICD-10-CM

## 2018-03-29 DIAGNOSIS — E1142 Type 2 diabetes mellitus with diabetic polyneuropathy: Secondary | ICD-10-CM

## 2018-03-29 DIAGNOSIS — B351 Tinea unguium: Secondary | ICD-10-CM

## 2018-03-29 DIAGNOSIS — D689 Coagulation defect, unspecified: Secondary | ICD-10-CM

## 2018-03-29 NOTE — Progress Notes (Signed)
He presents today chief complaint of painful elongated toenails he has a history of diabetic peripheral neuropathy states that he is doing pretty well with it.  Objective: Vital signs are stable alert and oriented x3.  Pulses are palpable.  Neurologic sensorium is intact.  Degenerative flexors are intact.  Nails are long thick yellow dystrophic-like mycotic and painful palpation as well as debridement.  Assessment: Pain in limb secondary to onychomycosis and diabetic peripheral neuropathy.  Plan: Debridement of toenails 1 through 5 bilateral.

## 2018-04-17 ENCOUNTER — Other Ambulatory Visit: Payer: Self-pay | Admitting: Internal Medicine

## 2018-04-17 DIAGNOSIS — I739 Peripheral vascular disease, unspecified: Secondary | ICD-10-CM

## 2018-04-30 ENCOUNTER — Inpatient Hospital Stay: Admission: RE | Admit: 2018-04-30 | Payer: Medicare Other | Source: Ambulatory Visit

## 2018-05-08 ENCOUNTER — Other Ambulatory Visit: Payer: Self-pay | Admitting: Physician Assistant

## 2018-05-08 DIAGNOSIS — D485 Neoplasm of uncertain behavior of skin: Secondary | ICD-10-CM | POA: Diagnosis not present

## 2018-05-08 DIAGNOSIS — D0461 Carcinoma in situ of skin of right upper limb, including shoulder: Secondary | ICD-10-CM | POA: Diagnosis not present

## 2018-05-08 DIAGNOSIS — L820 Inflamed seborrheic keratosis: Secondary | ICD-10-CM | POA: Diagnosis not present

## 2018-05-08 DIAGNOSIS — L570 Actinic keratosis: Secondary | ICD-10-CM | POA: Diagnosis not present

## 2018-05-09 ENCOUNTER — Ambulatory Visit
Admission: RE | Admit: 2018-05-09 | Discharge: 2018-05-09 | Disposition: A | Discharge status: Home / Self Care | Payer: Medicare Other | Source: Ambulatory Visit | Attending: Internal Medicine | Admitting: Internal Medicine

## 2018-05-09 DIAGNOSIS — I739 Peripheral vascular disease, unspecified: Secondary | ICD-10-CM

## 2018-05-09 DIAGNOSIS — M79662 Pain in left lower leg: Secondary | ICD-10-CM | POA: Diagnosis not present

## 2018-05-09 DIAGNOSIS — M79661 Pain in right lower leg: Secondary | ICD-10-CM | POA: Diagnosis not present

## 2018-06-05 ENCOUNTER — Ambulatory Visit (INDEPENDENT_AMBULATORY_CARE_PROVIDER_SITE_OTHER): Payer: Medicare Other | Admitting: Podiatry

## 2018-06-05 ENCOUNTER — Encounter: Payer: Self-pay | Admitting: Podiatry

## 2018-06-05 DIAGNOSIS — D689 Coagulation defect, unspecified: Secondary | ICD-10-CM

## 2018-06-05 DIAGNOSIS — B351 Tinea unguium: Secondary | ICD-10-CM

## 2018-06-05 DIAGNOSIS — M79676 Pain in unspecified toe(s): Secondary | ICD-10-CM

## 2018-06-05 DIAGNOSIS — E1142 Type 2 diabetes mellitus with diabetic polyneuropathy: Secondary | ICD-10-CM

## 2018-06-05 NOTE — Progress Notes (Signed)
He presents today chief complaint of painful elongated toenails 1 through 5 bilateral.  Objective: Vital signs are stable he is alert oriented x3 toenails are long thick yellow dystrophic-like mycotic.  Plan: Debridement of toenails 1 through 5 bilateral.

## 2018-07-10 DIAGNOSIS — J342 Deviated nasal septum: Secondary | ICD-10-CM | POA: Diagnosis not present

## 2018-07-10 DIAGNOSIS — J343 Hypertrophy of nasal turbinates: Secondary | ICD-10-CM | POA: Diagnosis not present

## 2018-07-10 DIAGNOSIS — H838X3 Other specified diseases of inner ear, bilateral: Secondary | ICD-10-CM | POA: Diagnosis not present

## 2018-07-10 DIAGNOSIS — J310 Chronic rhinitis: Secondary | ICD-10-CM | POA: Diagnosis not present

## 2018-07-10 DIAGNOSIS — H903 Sensorineural hearing loss, bilateral: Secondary | ICD-10-CM | POA: Diagnosis not present

## 2018-07-29 DIAGNOSIS — E119 Type 2 diabetes mellitus without complications: Secondary | ICD-10-CM | POA: Diagnosis not present

## 2018-07-29 DIAGNOSIS — H66002 Acute suppurative otitis media without spontaneous rupture of ear drum, left ear: Secondary | ICD-10-CM | POA: Diagnosis not present

## 2018-08-01 DIAGNOSIS — H9209 Otalgia, unspecified ear: Secondary | ICD-10-CM | POA: Diagnosis not present

## 2018-08-07 ENCOUNTER — Encounter: Payer: Self-pay | Admitting: Podiatry

## 2018-08-07 ENCOUNTER — Ambulatory Visit (INDEPENDENT_AMBULATORY_CARE_PROVIDER_SITE_OTHER): Payer: Medicare Other | Admitting: Podiatry

## 2018-08-07 DIAGNOSIS — M79676 Pain in unspecified toe(s): Secondary | ICD-10-CM | POA: Diagnosis not present

## 2018-08-07 DIAGNOSIS — B351 Tinea unguium: Secondary | ICD-10-CM

## 2018-08-07 DIAGNOSIS — D689 Coagulation defect, unspecified: Secondary | ICD-10-CM

## 2018-08-07 DIAGNOSIS — E1142 Type 2 diabetes mellitus with diabetic polyneuropathy: Secondary | ICD-10-CM

## 2018-08-07 NOTE — Progress Notes (Signed)
He presents today chief complaint of painful elongated toenails he has a history of diabetic peripheral neuropathy but states that everything is doing fine he only has pain occasionally.  Objective: Vital signs are stable he is alert oriented x3 pulses are palpable.  Neurologic sensorium is diminished per Semmes Weinstein monofilament to the toes.  Otherwise his toenails are long dystrophic sharply incurvated tender on palpation.  Assessment: Pain in limb secondary onychomycosis diabetic peripheral neuropathy.  Plan: Debridement of toenails 1 through 5 bilateral.

## 2018-08-28 DIAGNOSIS — C678 Malignant neoplasm of overlapping sites of bladder: Secondary | ICD-10-CM | POA: Diagnosis not present

## 2018-09-03 DIAGNOSIS — Z794 Long term (current) use of insulin: Secondary | ICD-10-CM | POA: Diagnosis not present

## 2018-09-03 DIAGNOSIS — I739 Peripheral vascular disease, unspecified: Secondary | ICD-10-CM | POA: Diagnosis not present

## 2018-09-03 DIAGNOSIS — E7800 Pure hypercholesterolemia, unspecified: Secondary | ICD-10-CM | POA: Diagnosis not present

## 2018-09-03 DIAGNOSIS — I82531 Chronic embolism and thrombosis of right popliteal vein: Secondary | ICD-10-CM | POA: Diagnosis not present

## 2018-09-03 DIAGNOSIS — I10 Essential (primary) hypertension: Secondary | ICD-10-CM | POA: Diagnosis not present

## 2018-09-03 DIAGNOSIS — E1169 Type 2 diabetes mellitus with other specified complication: Secondary | ICD-10-CM | POA: Diagnosis not present

## 2018-09-04 DIAGNOSIS — Z5111 Encounter for antineoplastic chemotherapy: Secondary | ICD-10-CM | POA: Diagnosis not present

## 2018-09-04 DIAGNOSIS — C678 Malignant neoplasm of overlapping sites of bladder: Secondary | ICD-10-CM | POA: Diagnosis not present

## 2018-09-12 DIAGNOSIS — Z5111 Encounter for antineoplastic chemotherapy: Secondary | ICD-10-CM | POA: Diagnosis not present

## 2018-09-12 DIAGNOSIS — C678 Malignant neoplasm of overlapping sites of bladder: Secondary | ICD-10-CM | POA: Diagnosis not present

## 2018-09-20 DIAGNOSIS — Z5111 Encounter for antineoplastic chemotherapy: Secondary | ICD-10-CM | POA: Diagnosis not present

## 2018-09-20 DIAGNOSIS — C678 Malignant neoplasm of overlapping sites of bladder: Secondary | ICD-10-CM | POA: Diagnosis not present

## 2018-10-13 IMAGING — CT CT ANGIO CHEST
2 of 6 series · 18 of 46 positions shown · IV contrast (APPLIED)
Comparison: CT chest 03/09/2016

CLINICAL DATA: Recent labs no cysts scratch the recent diagnosis of
deep venous thrombosis. Dyspnea on exertion and chest tightness
since 03/19/2016.

EXAM:
CT ANGIOGRAPHY CHEST WITH CONTRAST
TECHNIQUE: Multidetector CT imaging of the chest was performed using the
standard protocol during bolus administration of intravenous
contrast. Multiplanar CT image reconstructions and MIPs were
obtained to evaluate the vascular anatomy.
CONTRAST:  100 cc Isovue 370.

[Series 5: thins · axial · 0.76mm/px · z∈[-254,+13]mm · 16 of 293 slices shown]
[im 13/293  lung]
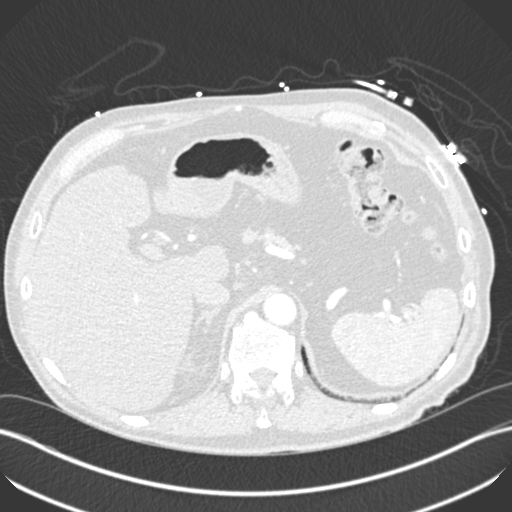
[im 39/293  soft-tissue]
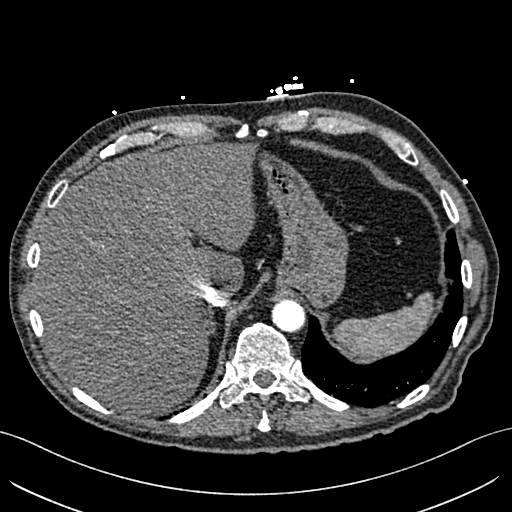
[im 51/293  lung]
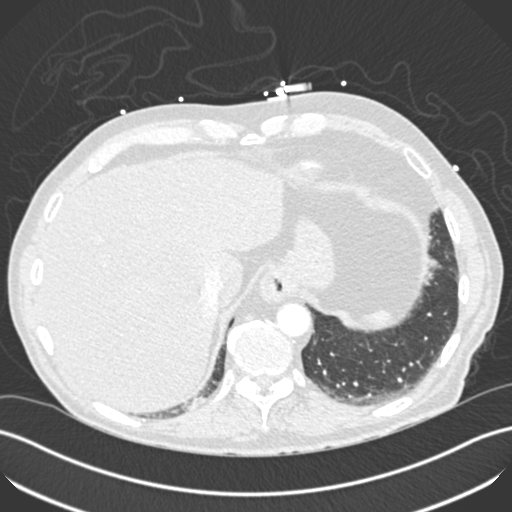
[im 64/293  soft-tissue]
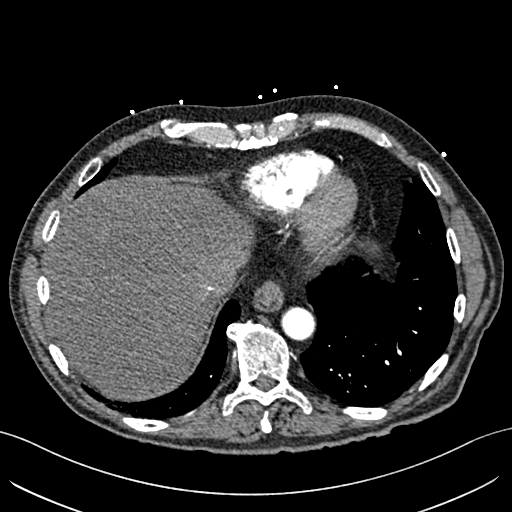
[im 89/293  lung]
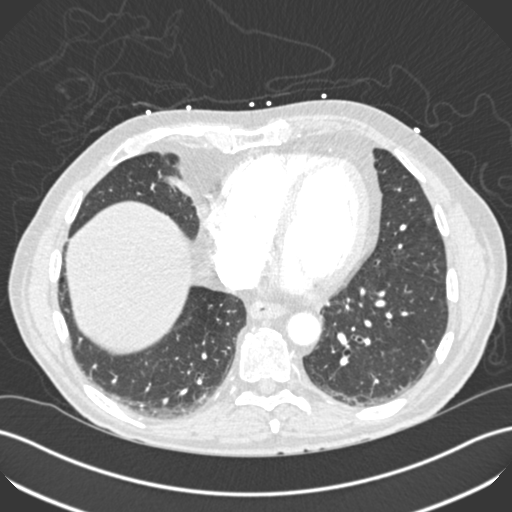
[im 102/293  soft-tissue]
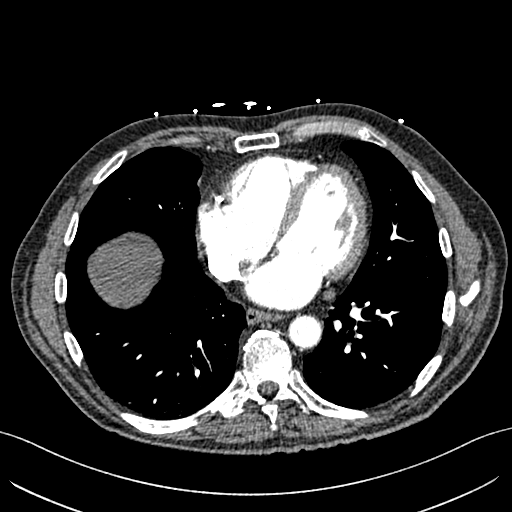
[im 115/293  lung]
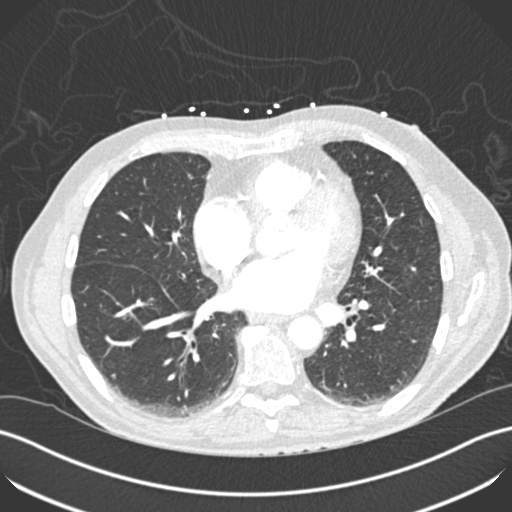
[im 140/293  soft-tissue]
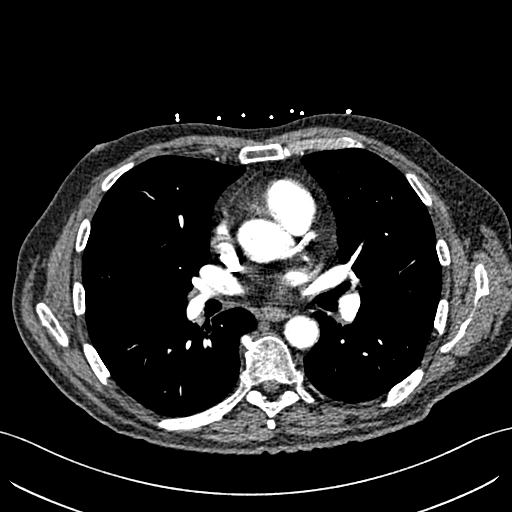
[im 153/293  lung]
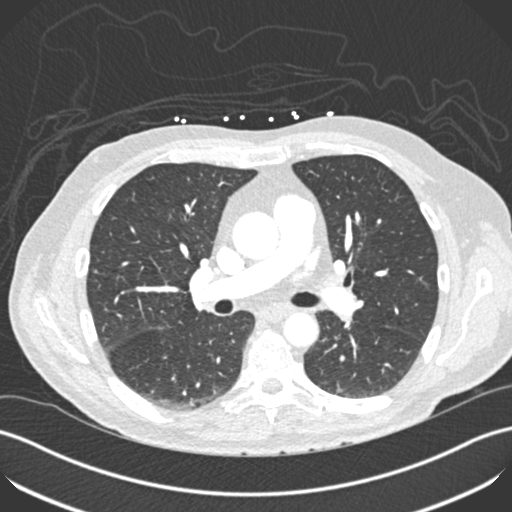
[im 178/293  soft-tissue]
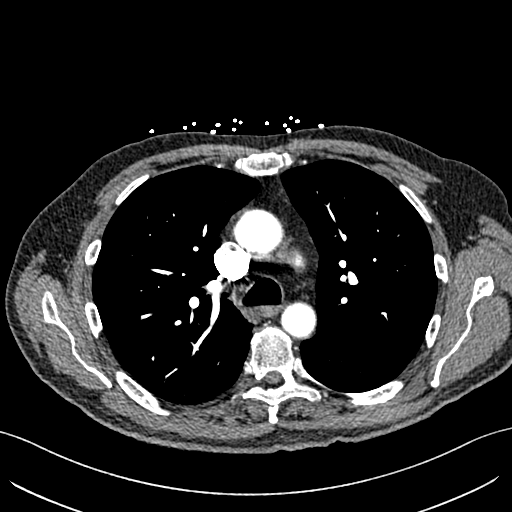
[im 191/293  lung]
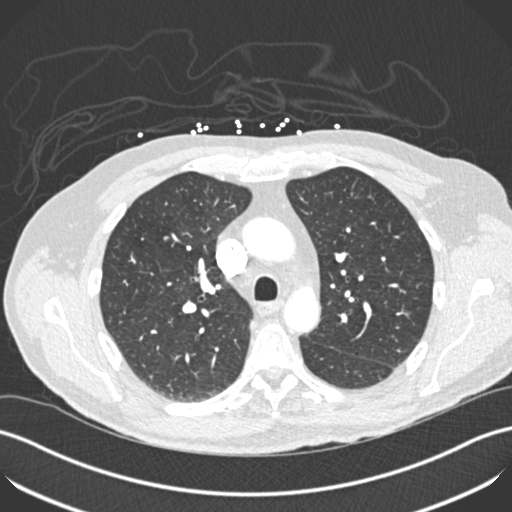
[im 204/293  soft-tissue]
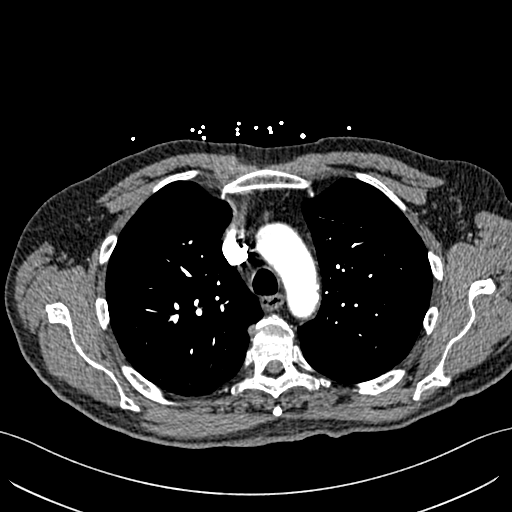
[im 229/293  lung]
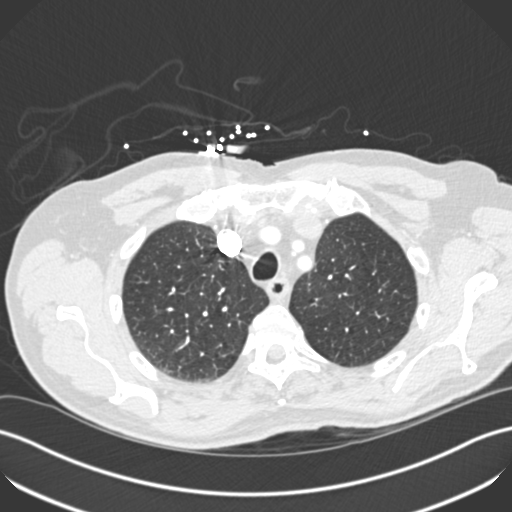
[im 242/293  soft-tissue]
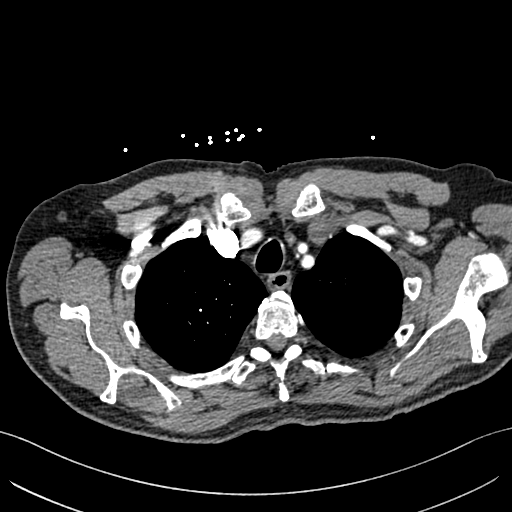
[im 254/293  lung]
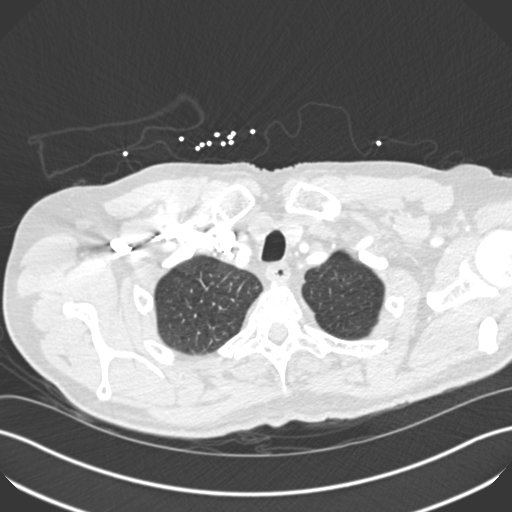
[im 280/293  soft-tissue]
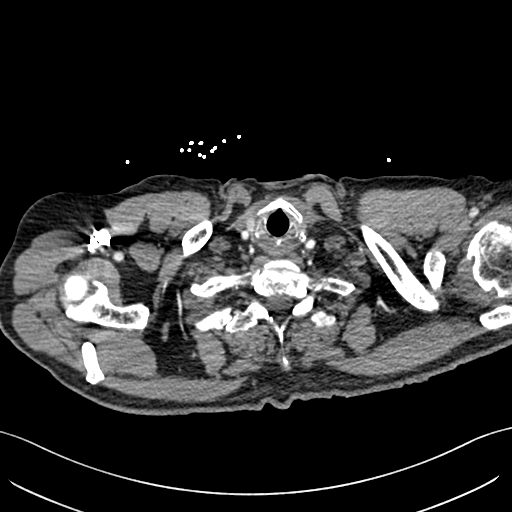

[Series 7: coronal mpr · coronal · 0.57mm/px · 2 of 86 slices shown]
[im 29/86  soft-tissue]
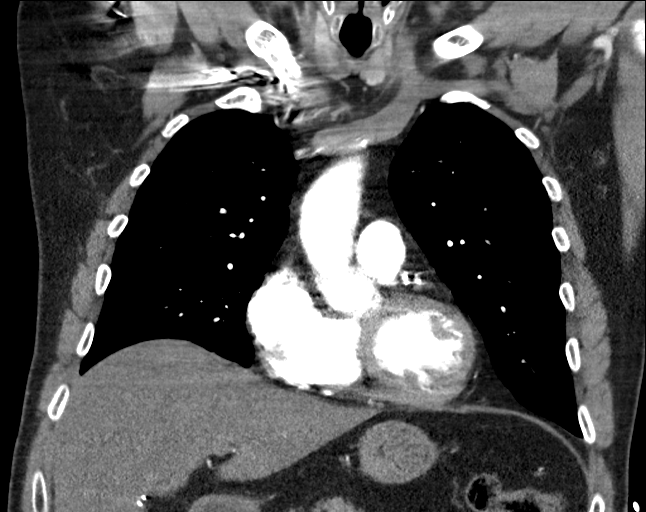
[im 57/86  soft-tissue]
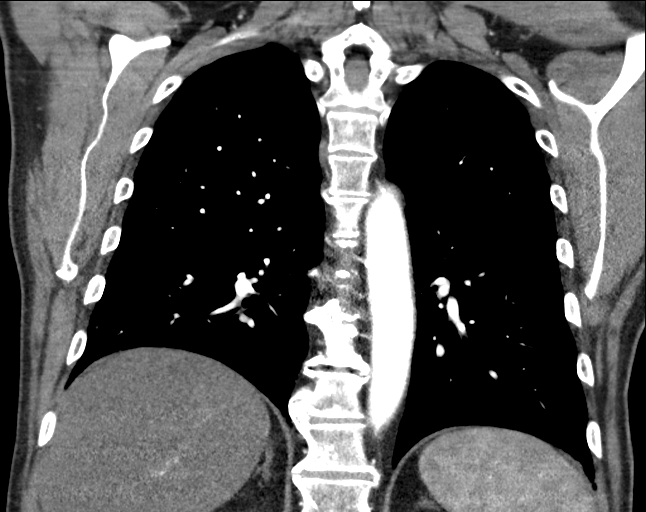

[18 of 46 positions shown; findings below may reference images not displayed]

FINDINGS: Cardiovascular: A nonobstructing linear opacity is seen in a
segmental left lower lobe pulmonary arterial branch consistent with
a remote pulmonary embolus. No evidence of acute embolus is
identified. No obstructive embolus is present. There is calcific
aortic and coronary atherosclerosis. Heart size is normal. No
pericardial effusion.

Mediastinum/Nodes: Small hiatal hernia is identified. No
lymphadenopathy.

Lungs/Pleura: No pleural effusion. Mild dependent atelectasis is
identified. The lungs demonstrate some dependent atelectasis. The
lungs also show mild emphysematous disease. Scattered subcentimeter
pulmonary nodules seen on the most recent study are unchanged. No
consolidative process is identified.

Upper Abdomen: There is diffuse fatty infiltration of the liver. The
patient is status post cholecystectomy.

Musculoskeletal: No chest wall abnormality. No acute or significant
osseous findings.

Review of the MIP images confirms the above findings.
IMPRESSION: Single small web in a descending interlobar branch on the left is
most consistent with a remote embolus. No obstructing or acute
embolus is identified.

Mild emphysema with unchanged subcentimeter pulmonary nodules.

Calcific aortic and coronary atherosclerosis.

Small hiatal hernia.

Fatty infiltration of the liver.

## 2018-10-20 DIAGNOSIS — Z0184 Encounter for antibody response examination: Secondary | ICD-10-CM | POA: Diagnosis not present

## 2018-10-20 DIAGNOSIS — Z1159 Encounter for screening for other viral diseases: Secondary | ICD-10-CM | POA: Diagnosis not present

## 2019-01-01 ENCOUNTER — Ambulatory Visit (INDEPENDENT_AMBULATORY_CARE_PROVIDER_SITE_OTHER): Payer: Medicare Other | Admitting: Podiatry

## 2019-01-01 ENCOUNTER — Other Ambulatory Visit: Payer: Self-pay

## 2019-01-01 ENCOUNTER — Encounter: Payer: Self-pay | Admitting: Podiatry

## 2019-01-01 VITALS — Temp 97.4°F

## 2019-01-01 DIAGNOSIS — M79676 Pain in unspecified toe(s): Secondary | ICD-10-CM

## 2019-01-01 DIAGNOSIS — B351 Tinea unguium: Secondary | ICD-10-CM

## 2019-01-01 DIAGNOSIS — D689 Coagulation defect, unspecified: Secondary | ICD-10-CM

## 2019-01-01 DIAGNOSIS — E1142 Type 2 diabetes mellitus with diabetic polyneuropathy: Secondary | ICD-10-CM | POA: Diagnosis not present

## 2019-01-01 NOTE — Progress Notes (Signed)
He presents today chief complaint of painful elongated toenails.  States that his diabetes is not as well-controlled as it has been in the past and started to notice some wet type sensation to the plantar aspect of bilateral foot.  Denies any other changes.  Objective: Vital signs are stable alert and oriented x3 pulses are palpable.  Diminished sensorium per Semmes-Weinstein monofilament test to the level of the toes adequate on the plantar forefoot.  Muscle strength is normal symmetrical.  Toenails are long thick yellow dystrophic with mycotic painful palpation as well as debridement.  Assessment: Pain in limb secondary to onychomycosis diabetic peripheral neuropathy.  Plan: Debridement of toenails 1 through 5 bilateral secondary to pain and blood thinner.

## 2019-01-14 DIAGNOSIS — Z961 Presence of intraocular lens: Secondary | ICD-10-CM | POA: Diagnosis not present

## 2019-01-14 DIAGNOSIS — E119 Type 2 diabetes mellitus without complications: Secondary | ICD-10-CM | POA: Diagnosis not present

## 2019-01-28 DIAGNOSIS — I10 Essential (primary) hypertension: Secondary | ICD-10-CM | POA: Diagnosis not present

## 2019-01-28 DIAGNOSIS — M170 Bilateral primary osteoarthritis of knee: Secondary | ICD-10-CM | POA: Diagnosis not present

## 2019-01-28 DIAGNOSIS — E109 Type 1 diabetes mellitus without complications: Secondary | ICD-10-CM | POA: Diagnosis not present

## 2019-01-28 DIAGNOSIS — E782 Mixed hyperlipidemia: Secondary | ICD-10-CM | POA: Diagnosis not present

## 2019-01-28 DIAGNOSIS — E1169 Type 2 diabetes mellitus with other specified complication: Secondary | ICD-10-CM | POA: Diagnosis not present

## 2019-01-28 DIAGNOSIS — E7800 Pure hypercholesterolemia, unspecified: Secondary | ICD-10-CM | POA: Diagnosis not present

## 2019-01-28 DIAGNOSIS — E785 Hyperlipidemia, unspecified: Secondary | ICD-10-CM | POA: Diagnosis not present

## 2019-01-28 DIAGNOSIS — E119 Type 2 diabetes mellitus without complications: Secondary | ICD-10-CM | POA: Diagnosis not present

## 2019-02-19 DIAGNOSIS — Z23 Encounter for immunization: Secondary | ICD-10-CM | POA: Diagnosis not present

## 2019-02-25 DIAGNOSIS — Z125 Encounter for screening for malignant neoplasm of prostate: Secondary | ICD-10-CM | POA: Diagnosis not present

## 2019-02-25 DIAGNOSIS — C678 Malignant neoplasm of overlapping sites of bladder: Secondary | ICD-10-CM | POA: Diagnosis not present

## 2019-02-27 DIAGNOSIS — M170 Bilateral primary osteoarthritis of knee: Secondary | ICD-10-CM | POA: Diagnosis not present

## 2019-02-27 DIAGNOSIS — E1169 Type 2 diabetes mellitus with other specified complication: Secondary | ICD-10-CM | POA: Diagnosis not present

## 2019-02-27 DIAGNOSIS — I10 Essential (primary) hypertension: Secondary | ICD-10-CM | POA: Diagnosis not present

## 2019-02-27 DIAGNOSIS — E782 Mixed hyperlipidemia: Secondary | ICD-10-CM | POA: Diagnosis not present

## 2019-02-27 DIAGNOSIS — E7800 Pure hypercholesterolemia, unspecified: Secondary | ICD-10-CM | POA: Diagnosis not present

## 2019-02-27 DIAGNOSIS — E785 Hyperlipidemia, unspecified: Secondary | ICD-10-CM | POA: Diagnosis not present

## 2019-02-27 DIAGNOSIS — E109 Type 1 diabetes mellitus without complications: Secondary | ICD-10-CM | POA: Diagnosis not present

## 2019-02-27 DIAGNOSIS — E119 Type 2 diabetes mellitus without complications: Secondary | ICD-10-CM | POA: Diagnosis not present

## 2019-03-05 ENCOUNTER — Ambulatory Visit (INDEPENDENT_AMBULATORY_CARE_PROVIDER_SITE_OTHER): Payer: Medicare Other | Admitting: Podiatry

## 2019-03-05 ENCOUNTER — Other Ambulatory Visit: Payer: Self-pay

## 2019-03-05 ENCOUNTER — Encounter: Payer: Self-pay | Admitting: Podiatry

## 2019-03-05 DIAGNOSIS — D689 Coagulation defect, unspecified: Secondary | ICD-10-CM | POA: Diagnosis not present

## 2019-03-05 DIAGNOSIS — E1142 Type 2 diabetes mellitus with diabetic polyneuropathy: Secondary | ICD-10-CM | POA: Diagnosis not present

## 2019-03-05 DIAGNOSIS — M79676 Pain in unspecified toe(s): Secondary | ICD-10-CM

## 2019-03-05 DIAGNOSIS — B351 Tinea unguium: Secondary | ICD-10-CM

## 2019-03-06 ENCOUNTER — Encounter: Payer: Self-pay | Admitting: Podiatry

## 2019-03-06 NOTE — Progress Notes (Signed)
He presents today chief complaint of painfully elongated toenails and diabetic peripheral neuropathy.  Objective: Pulses are palpable.  Neurologic sensorium is slightly diminished per Semmes Weinstein monofilament.  No open lesions or wounds.  Toenails are slightly longer on the left foot than they are on the right slightly thickened and tender.  Assessment: Pain in limb secondary to onychomycosis and diabetic peripheral neuropathy.  Plan: Discussed etiology pathology conservative versus surgical therapies.  Debrided toenails 1 through 5.  Bilaterally

## 2019-03-12 DIAGNOSIS — Z5111 Encounter for antineoplastic chemotherapy: Secondary | ICD-10-CM | POA: Diagnosis not present

## 2019-03-12 DIAGNOSIS — C678 Malignant neoplasm of overlapping sites of bladder: Secondary | ICD-10-CM | POA: Diagnosis not present

## 2019-03-12 DIAGNOSIS — R8271 Bacteriuria: Secondary | ICD-10-CM | POA: Diagnosis not present

## 2019-03-26 DIAGNOSIS — Z5111 Encounter for antineoplastic chemotherapy: Secondary | ICD-10-CM | POA: Diagnosis not present

## 2019-03-26 DIAGNOSIS — C672 Malignant neoplasm of lateral wall of bladder: Secondary | ICD-10-CM | POA: Diagnosis not present

## 2019-04-02 DIAGNOSIS — C678 Malignant neoplasm of overlapping sites of bladder: Secondary | ICD-10-CM | POA: Diagnosis not present

## 2019-04-02 DIAGNOSIS — Z5111 Encounter for antineoplastic chemotherapy: Secondary | ICD-10-CM | POA: Diagnosis not present

## 2019-04-12 DIAGNOSIS — Z794 Long term (current) use of insulin: Secondary | ICD-10-CM | POA: Diagnosis not present

## 2019-04-12 DIAGNOSIS — Z0000 Encounter for general adult medical examination without abnormal findings: Secondary | ICD-10-CM | POA: Diagnosis not present

## 2019-04-12 DIAGNOSIS — Z1389 Encounter for screening for other disorder: Secondary | ICD-10-CM | POA: Diagnosis not present

## 2019-04-12 DIAGNOSIS — C679 Malignant neoplasm of bladder, unspecified: Secondary | ICD-10-CM | POA: Diagnosis not present

## 2019-04-12 DIAGNOSIS — E1169 Type 2 diabetes mellitus with other specified complication: Secondary | ICD-10-CM | POA: Diagnosis not present

## 2019-04-12 DIAGNOSIS — I82531 Chronic embolism and thrombosis of right popliteal vein: Secondary | ICD-10-CM | POA: Diagnosis not present

## 2019-04-12 DIAGNOSIS — E7800 Pure hypercholesterolemia, unspecified: Secondary | ICD-10-CM | POA: Diagnosis not present

## 2019-04-12 DIAGNOSIS — I10 Essential (primary) hypertension: Secondary | ICD-10-CM | POA: Diagnosis not present

## 2019-04-12 DIAGNOSIS — Z23 Encounter for immunization: Secondary | ICD-10-CM | POA: Diagnosis not present

## 2019-05-02 DIAGNOSIS — M79642 Pain in left hand: Secondary | ICD-10-CM | POA: Diagnosis not present

## 2019-05-02 DIAGNOSIS — M720 Palmar fascial fibromatosis [Dupuytren]: Secondary | ICD-10-CM | POA: Diagnosis not present

## 2019-05-07 ENCOUNTER — Ambulatory Visit: Payer: Medicare Other | Admitting: Podiatry

## 2019-05-10 ENCOUNTER — Other Ambulatory Visit: Payer: Self-pay

## 2019-05-21 ENCOUNTER — Ambulatory Visit (INDEPENDENT_AMBULATORY_CARE_PROVIDER_SITE_OTHER): Payer: Medicare Other | Admitting: Podiatry

## 2019-05-21 ENCOUNTER — Encounter: Payer: Self-pay | Admitting: Podiatry

## 2019-05-21 ENCOUNTER — Other Ambulatory Visit: Payer: Self-pay

## 2019-05-21 DIAGNOSIS — E1142 Type 2 diabetes mellitus with diabetic polyneuropathy: Secondary | ICD-10-CM

## 2019-05-21 DIAGNOSIS — M79676 Pain in unspecified toe(s): Secondary | ICD-10-CM

## 2019-05-21 DIAGNOSIS — B351 Tinea unguium: Secondary | ICD-10-CM | POA: Diagnosis not present

## 2019-05-21 DIAGNOSIS — D689 Coagulation defect, unspecified: Secondary | ICD-10-CM | POA: Diagnosis not present

## 2019-05-21 NOTE — Progress Notes (Signed)
He presents today with a history of diabetes and diabetic peripheral neuropathy with a chief complaint of painfully elongated toenails.  He denies any open wounds.  Objective: Vital signs are stable he is alert and oriented x3 toenails are long thick yellow dystrophic-like mycotic and painful on palpation.  Pulses are palpable with no injuries to the foot.  No open lesions or wounds.  Assessment: Pain in limb secondary onychomycosis.  Plan: Debridement of toenails 1 through 5 bilaterally.

## 2019-06-11 DIAGNOSIS — M79645 Pain in left finger(s): Secondary | ICD-10-CM | POA: Diagnosis not present

## 2019-06-11 DIAGNOSIS — M79642 Pain in left hand: Secondary | ICD-10-CM | POA: Diagnosis not present

## 2019-06-11 DIAGNOSIS — M25642 Stiffness of left hand, not elsewhere classified: Secondary | ICD-10-CM | POA: Diagnosis not present

## 2019-06-19 DIAGNOSIS — M25642 Stiffness of left hand, not elsewhere classified: Secondary | ICD-10-CM | POA: Diagnosis not present

## 2019-06-19 DIAGNOSIS — M79642 Pain in left hand: Secondary | ICD-10-CM | POA: Diagnosis not present

## 2019-06-19 DIAGNOSIS — M79645 Pain in left finger(s): Secondary | ICD-10-CM | POA: Diagnosis not present

## 2019-06-26 DIAGNOSIS — M79642 Pain in left hand: Secondary | ICD-10-CM | POA: Diagnosis not present

## 2019-06-26 DIAGNOSIS — M25642 Stiffness of left hand, not elsewhere classified: Secondary | ICD-10-CM | POA: Diagnosis not present

## 2019-06-26 DIAGNOSIS — M79645 Pain in left finger(s): Secondary | ICD-10-CM | POA: Diagnosis not present

## 2019-07-04 DIAGNOSIS — M79642 Pain in left hand: Secondary | ICD-10-CM | POA: Diagnosis not present

## 2019-07-04 DIAGNOSIS — M79645 Pain in left finger(s): Secondary | ICD-10-CM | POA: Diagnosis not present

## 2019-07-04 DIAGNOSIS — M25642 Stiffness of left hand, not elsewhere classified: Secondary | ICD-10-CM | POA: Diagnosis not present

## 2019-07-12 DIAGNOSIS — M720 Palmar fascial fibromatosis [Dupuytren]: Secondary | ICD-10-CM | POA: Diagnosis not present

## 2019-07-12 DIAGNOSIS — M25642 Stiffness of left hand, not elsewhere classified: Secondary | ICD-10-CM | POA: Diagnosis not present

## 2019-07-12 DIAGNOSIS — M79642 Pain in left hand: Secondary | ICD-10-CM | POA: Diagnosis not present

## 2019-07-12 DIAGNOSIS — M79645 Pain in left finger(s): Secondary | ICD-10-CM | POA: Diagnosis not present

## 2019-07-25 DIAGNOSIS — M79642 Pain in left hand: Secondary | ICD-10-CM | POA: Diagnosis not present

## 2019-07-25 DIAGNOSIS — M720 Palmar fascial fibromatosis [Dupuytren]: Secondary | ICD-10-CM | POA: Diagnosis not present

## 2019-07-25 DIAGNOSIS — M79645 Pain in left finger(s): Secondary | ICD-10-CM | POA: Diagnosis not present

## 2019-07-25 DIAGNOSIS — M25642 Stiffness of left hand, not elsewhere classified: Secondary | ICD-10-CM | POA: Diagnosis not present

## 2019-07-30 DIAGNOSIS — C678 Malignant neoplasm of overlapping sites of bladder: Secondary | ICD-10-CM | POA: Diagnosis not present

## 2019-08-01 DIAGNOSIS — M720 Palmar fascial fibromatosis [Dupuytren]: Secondary | ICD-10-CM | POA: Diagnosis not present

## 2019-08-01 DIAGNOSIS — M79642 Pain in left hand: Secondary | ICD-10-CM | POA: Diagnosis not present

## 2019-08-01 DIAGNOSIS — M25642 Stiffness of left hand, not elsewhere classified: Secondary | ICD-10-CM | POA: Diagnosis not present

## 2019-08-01 DIAGNOSIS — M79645 Pain in left finger(s): Secondary | ICD-10-CM | POA: Diagnosis not present

## 2019-08-06 DIAGNOSIS — Z5111 Encounter for antineoplastic chemotherapy: Secondary | ICD-10-CM | POA: Diagnosis not present

## 2019-08-06 DIAGNOSIS — C672 Malignant neoplasm of lateral wall of bladder: Secondary | ICD-10-CM | POA: Diagnosis not present

## 2019-08-08 ENCOUNTER — Encounter: Payer: Self-pay | Admitting: Podiatry

## 2019-08-08 ENCOUNTER — Ambulatory Visit (INDEPENDENT_AMBULATORY_CARE_PROVIDER_SITE_OTHER): Payer: Medicare Other | Admitting: Podiatry

## 2019-08-08 ENCOUNTER — Other Ambulatory Visit: Payer: Self-pay

## 2019-08-08 DIAGNOSIS — B351 Tinea unguium: Secondary | ICD-10-CM

## 2019-08-08 DIAGNOSIS — D689 Coagulation defect, unspecified: Secondary | ICD-10-CM | POA: Diagnosis not present

## 2019-08-08 DIAGNOSIS — M79676 Pain in unspecified toe(s): Secondary | ICD-10-CM

## 2019-08-08 DIAGNOSIS — E1142 Type 2 diabetes mellitus with diabetic polyneuropathy: Secondary | ICD-10-CM | POA: Diagnosis not present

## 2019-08-08 NOTE — Progress Notes (Signed)
He presents today chief complaint of painfully elongated toenails.  Objective: Vital signs are stable he is alert oriented x3 pulses remain palpable nails are long thick yellow dystrophic-like mycotic no open lesions or wounds.  Assessment: Pain in limb secondary to diabetic peripheral neuropathy and pain in limb secondary to onychomycosis.  Plan: Debridement of toenails 1 through 5 bilateral.  Follow-up with me in 3 months

## 2019-08-13 DIAGNOSIS — C678 Malignant neoplasm of overlapping sites of bladder: Secondary | ICD-10-CM | POA: Diagnosis not present

## 2019-08-13 DIAGNOSIS — Z5111 Encounter for antineoplastic chemotherapy: Secondary | ICD-10-CM | POA: Diagnosis not present

## 2019-08-20 DIAGNOSIS — Z5111 Encounter for antineoplastic chemotherapy: Secondary | ICD-10-CM | POA: Diagnosis not present

## 2019-08-20 DIAGNOSIS — C678 Malignant neoplasm of overlapping sites of bladder: Secondary | ICD-10-CM | POA: Diagnosis not present

## 2019-12-19 DIAGNOSIS — M720 Palmar fascial fibromatosis [Dupuytren]: Secondary | ICD-10-CM | POA: Diagnosis not present

## 2019-12-19 DIAGNOSIS — M79642 Pain in left hand: Secondary | ICD-10-CM | POA: Diagnosis not present

## 2020-01-02 DIAGNOSIS — E119 Type 2 diabetes mellitus without complications: Secondary | ICD-10-CM | POA: Diagnosis not present

## 2020-01-02 DIAGNOSIS — Z7984 Long term (current) use of oral hypoglycemic drugs: Secondary | ICD-10-CM | POA: Diagnosis not present

## 2020-01-07 DIAGNOSIS — E7800 Pure hypercholesterolemia, unspecified: Secondary | ICD-10-CM | POA: Diagnosis not present

## 2020-01-07 DIAGNOSIS — Z86718 Personal history of other venous thrombosis and embolism: Secondary | ICD-10-CM | POA: Diagnosis not present

## 2020-01-07 DIAGNOSIS — E1151 Type 2 diabetes mellitus with diabetic peripheral angiopathy without gangrene: Secondary | ICD-10-CM | POA: Diagnosis not present

## 2020-01-07 DIAGNOSIS — I10 Essential (primary) hypertension: Secondary | ICD-10-CM | POA: Diagnosis not present

## 2020-01-07 DIAGNOSIS — Z7984 Long term (current) use of oral hypoglycemic drugs: Secondary | ICD-10-CM | POA: Diagnosis not present

## 2020-01-07 DIAGNOSIS — E1169 Type 2 diabetes mellitus with other specified complication: Secondary | ICD-10-CM | POA: Diagnosis not present

## 2020-01-20 DIAGNOSIS — I10 Essential (primary) hypertension: Secondary | ICD-10-CM | POA: Diagnosis not present

## 2020-01-20 DIAGNOSIS — E7800 Pure hypercholesterolemia, unspecified: Secondary | ICD-10-CM | POA: Diagnosis not present

## 2020-01-20 DIAGNOSIS — E1169 Type 2 diabetes mellitus with other specified complication: Secondary | ICD-10-CM | POA: Diagnosis not present

## 2020-01-20 DIAGNOSIS — E119 Type 2 diabetes mellitus without complications: Secondary | ICD-10-CM | POA: Diagnosis not present

## 2020-01-20 DIAGNOSIS — E785 Hyperlipidemia, unspecified: Secondary | ICD-10-CM | POA: Diagnosis not present

## 2020-01-20 DIAGNOSIS — E782 Mixed hyperlipidemia: Secondary | ICD-10-CM | POA: Diagnosis not present

## 2020-01-20 DIAGNOSIS — E1151 Type 2 diabetes mellitus with diabetic peripheral angiopathy without gangrene: Secondary | ICD-10-CM | POA: Diagnosis not present

## 2020-01-20 DIAGNOSIS — M170 Bilateral primary osteoarthritis of knee: Secondary | ICD-10-CM | POA: Diagnosis not present

## 2020-01-21 DIAGNOSIS — Z961 Presence of intraocular lens: Secondary | ICD-10-CM | POA: Diagnosis not present

## 2020-01-21 DIAGNOSIS — H5213 Myopia, bilateral: Secondary | ICD-10-CM | POA: Diagnosis not present

## 2020-01-21 DIAGNOSIS — E119 Type 2 diabetes mellitus without complications: Secondary | ICD-10-CM | POA: Diagnosis not present

## 2020-01-23 ENCOUNTER — Other Ambulatory Visit: Payer: Self-pay

## 2020-01-23 ENCOUNTER — Encounter: Payer: Self-pay | Admitting: Podiatry

## 2020-01-23 ENCOUNTER — Ambulatory Visit (INDEPENDENT_AMBULATORY_CARE_PROVIDER_SITE_OTHER): Payer: Medicare Other | Admitting: Podiatry

## 2020-01-23 DIAGNOSIS — E1142 Type 2 diabetes mellitus with diabetic polyneuropathy: Secondary | ICD-10-CM | POA: Diagnosis not present

## 2020-01-23 DIAGNOSIS — D689 Coagulation defect, unspecified: Secondary | ICD-10-CM

## 2020-01-23 DIAGNOSIS — B351 Tinea unguium: Secondary | ICD-10-CM | POA: Diagnosis not present

## 2020-01-23 DIAGNOSIS — M79676 Pain in unspecified toe(s): Secondary | ICD-10-CM | POA: Diagnosis not present

## 2020-01-23 NOTE — Progress Notes (Signed)
He presents today chief complaint of painfully elongated toenails with numbness and tingling plantar aspect of the bilateral foot.  Objective: Vital signs are stable he is alert oriented x3 pulses remain strong palpable.  No open lesions or wounds are noted.  Decreased in sensorium per Thornell Mule monofilament to the tips of the toes.  Otherwise toenails are long thick yellow dystrophic-like mycotic.  Assessment: Pain in limb secondary to onychomycosis.  Plan: Debridement of toenails 1 through 5 bilateral cover service secondary to blood thinner as well as diabetic peripheral neuropathy and pain in limb.

## 2020-02-04 DIAGNOSIS — Z125 Encounter for screening for malignant neoplasm of prostate: Secondary | ICD-10-CM | POA: Diagnosis not present

## 2020-02-04 DIAGNOSIS — C678 Malignant neoplasm of overlapping sites of bladder: Secondary | ICD-10-CM | POA: Diagnosis not present

## 2020-02-04 DIAGNOSIS — R948 Abnormal results of function studies of other organs and systems: Secondary | ICD-10-CM | POA: Diagnosis not present

## 2020-02-11 ENCOUNTER — Ambulatory Visit: Payer: Medicare Other | Admitting: Podiatry

## 2020-02-18 DIAGNOSIS — Z5111 Encounter for antineoplastic chemotherapy: Secondary | ICD-10-CM | POA: Diagnosis not present

## 2020-02-18 DIAGNOSIS — C678 Malignant neoplasm of overlapping sites of bladder: Secondary | ICD-10-CM | POA: Diagnosis not present

## 2020-02-25 DIAGNOSIS — Z5111 Encounter for antineoplastic chemotherapy: Secondary | ICD-10-CM | POA: Diagnosis not present

## 2020-02-25 DIAGNOSIS — C678 Malignant neoplasm of overlapping sites of bladder: Secondary | ICD-10-CM | POA: Diagnosis not present

## 2020-02-27 ENCOUNTER — Encounter: Payer: Self-pay | Admitting: *Deleted

## 2020-02-28 ENCOUNTER — Encounter: Payer: Self-pay | Admitting: Neurology

## 2020-02-28 ENCOUNTER — Ambulatory Visit (INDEPENDENT_AMBULATORY_CARE_PROVIDER_SITE_OTHER): Payer: Medicare Other | Admitting: Neurology

## 2020-02-28 ENCOUNTER — Other Ambulatory Visit: Payer: Self-pay

## 2020-02-28 VITALS — BP 145/80 | HR 53 | Ht 72.0 in | Wt 203.0 lb

## 2020-02-28 DIAGNOSIS — C679 Malignant neoplasm of bladder, unspecified: Secondary | ICD-10-CM | POA: Diagnosis not present

## 2020-02-28 DIAGNOSIS — I2699 Other pulmonary embolism without acute cor pulmonale: Secondary | ICD-10-CM | POA: Diagnosis not present

## 2020-02-28 DIAGNOSIS — R29898 Other symptoms and signs involving the musculoskeletal system: Secondary | ICD-10-CM | POA: Diagnosis not present
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
## 2020-03-03 DIAGNOSIS — Z5111 Encounter for antineoplastic chemotherapy: Secondary | ICD-10-CM | POA: Diagnosis not present

## 2020-03-03 DIAGNOSIS — C678 Malignant neoplasm of overlapping sites of bladder: Secondary | ICD-10-CM | POA: Diagnosis not present

## 2020-03-17 DIAGNOSIS — Z23 Encounter for immunization: Secondary | ICD-10-CM | POA: Diagnosis not present

## 2020-03-25 DIAGNOSIS — Z23 Encounter for immunization: Secondary | ICD-10-CM | POA: Diagnosis not present

## 2020-03-26 DIAGNOSIS — E7800 Pure hypercholesterolemia, unspecified: Secondary | ICD-10-CM | POA: Diagnosis not present

## 2020-03-26 DIAGNOSIS — I10 Essential (primary) hypertension: Secondary | ICD-10-CM | POA: Diagnosis not present

## 2020-03-26 DIAGNOSIS — E1169 Type 2 diabetes mellitus with other specified complication: Secondary | ICD-10-CM | POA: Diagnosis not present

## 2020-03-26 DIAGNOSIS — E782 Mixed hyperlipidemia: Secondary | ICD-10-CM | POA: Diagnosis not present

## 2020-04-06 ENCOUNTER — Ambulatory Visit (INDEPENDENT_AMBULATORY_CARE_PROVIDER_SITE_OTHER): Payer: Medicare Other | Admitting: Neurology

## 2020-04-06 DIAGNOSIS — G5603 Carpal tunnel syndrome, bilateral upper limbs: Secondary | ICD-10-CM

## 2020-04-06 DIAGNOSIS — Z0289 Encounter for other administrative examinations: Secondary | ICD-10-CM

## 2020-04-06 DIAGNOSIS — R29898 Other symptoms and signs involving the musculoskeletal system: Secondary | ICD-10-CM

## 2020-04-06 NOTE — Procedures (Signed)
Full Name: [name] Gender: Male MRN #: [id number] Date of Birth: 1945-07-29    Visit Date: 04/06/2020 08:45 Age: 74 Years Examining Physician: Marcial Pacas, MD  Referring Physician: Marcial Pacas, MD History: 74 year old male presented with difficulty extending his fingers  Summary of the test: Nerve conduction study: Bilateral median sensory responses showed mildly to moderately prolonged peak latency, with mildly decreased snap amplitude.  Bilateral median motor responses were normal.  Left ulnar sensory motor responses were normal.  Electromyography: Selected needle examination of left upper extremity muscles were normal.  Conclusion: This is an abnormal study.  There is electrodiagnostic evidence of bilateral median neuropathy across the wrist, consistent with mild bilateral carpal tunnel syndromes.    ------------------------------- Marcial Pacas, M.D. PhD  [facility] [address], [place] [address] Tel: [phone] Fax: [phone]  Verbal informed consent was obtained from the patient, patient was informed of potential risk of procedure, including bruising, bleeding, hematoma formation, infection, muscle weakness, muscle pain, numbness, among others.         [place]    Nerve / Sites Muscle Latency Ref. Amplitude Ref. Rel Amp Segments Distance Velocity Ref. Area    ms ms mV mV %  cm m/s m/s mVms  L Median - APB     Wrist APB 4.4 ?4.4 1.6 ?4.0 100 Wrist - APB 7   7.6     Upper arm APB 9.3  1.2  74.2 Upper arm - Wrist 24 49 ?49 7.3     Ulnar wrist APB 4.4  6.2  514 Ulnar wrist - Upper arm    20.0     Ulnar B. elbow APB 9.1  5.6  90 Ulnar B. elbow - Ulnar wrist    18.5     Ulnar A. elbow APB 11.5  5.9  105 Ulnar A. elbow - Ulnar B. elbow    20.2  R Median - APB     Wrist APB 4.4 ?4.4 4.3 ?4.0 100 Wrist - APB 7   14.9     Upper arm APB 9.1  3.9  89.8 Upper arm - Wrist 23 49 ?49 14.0  L Ulnar - ADM     Wrist ADM 3.1 ?3.3 9.6 ?6.0 100  Wrist - ADM 7   24.5     B.Elbow ADM 7.4  9.3  96.7 B.Elbow - Wrist 21 49 ?49 25.4     A.Elbow ADM 9.4  7.8  83.1 A.Elbow - B.Elbow 10 49 ?49 24.4         A.Elbow - Wrist               SNC    Nerve / Sites Rec. Site Peak Lat Ref.  Amp Ref. Segments Distance    ms ms V V  cm  L Median - Orthodromic (Dig II, Mid palm)     Dig II Wrist 4.3 ?3.4 5 ?10 Dig II - Wrist 13  R Median - Orthodromic (Dig II, Mid palm)     Dig II Wrist 4.0 ?3.4 6 ?10 Dig II - Wrist 13  L Ulnar - Orthodromic, (Dig V, Mid palm)     Dig V Wrist 3.1 ?3.1 7 ?5 Dig V - Wrist 38           F  Wave    Nerve F Lat Ref.   ms ms  L Ulnar - ADM 36.6 ?32.0       EMG Summary Table  Spontaneous MUAP Recruitment  Muscle IA Fib PSW Fasc Other Amp Dur. Poly Pattern  L. First dorsal interosseous Normal None None None _______ Normal Normal Normal Normal  L. Abductor pollicis brevis Normal None None None _______ Normal Normal Normal Reduced  L. Pronator teres Normal None None None _______ Normal Normal Normal Normal  L. Biceps brachii Normal None None None _______ Normal Normal Normal Normal  L. Deltoid Normal None None None _______ Normal Normal Normal Normal  L. Triceps brachii Normal None None None _______ Normal Normal Normal Normal  L. Brachioradialis Normal None None None _______ Normal Normal Normal Normal  L. Extensor digitorum communis Normal None None None _______ Normal Normal Normal Normal

## 2020-04-14 DIAGNOSIS — M79642 Pain in left hand: Secondary | ICD-10-CM | POA: Diagnosis not present

## 2020-04-14 DIAGNOSIS — M720 Palmar fascial fibromatosis [Dupuytren]: Secondary | ICD-10-CM | POA: Diagnosis not present

## 2020-04-28 ENCOUNTER — Encounter: Payer: Self-pay | Admitting: Podiatry

## 2020-04-28 ENCOUNTER — Ambulatory Visit (INDEPENDENT_AMBULATORY_CARE_PROVIDER_SITE_OTHER): Payer: Medicare Other | Admitting: Podiatry

## 2020-04-28 ENCOUNTER — Other Ambulatory Visit: Payer: Self-pay

## 2020-04-28 DIAGNOSIS — B351 Tinea unguium: Secondary | ICD-10-CM | POA: Diagnosis not present

## 2020-04-28 DIAGNOSIS — E1169 Type 2 diabetes mellitus with other specified complication: Secondary | ICD-10-CM | POA: Diagnosis not present

## 2020-04-28 DIAGNOSIS — E1151 Type 2 diabetes mellitus with diabetic peripheral angiopathy without gangrene: Secondary | ICD-10-CM | POA: Diagnosis not present

## 2020-04-28 DIAGNOSIS — E1142 Type 2 diabetes mellitus with diabetic polyneuropathy: Secondary | ICD-10-CM

## 2020-04-28 DIAGNOSIS — I10 Essential (primary) hypertension: Secondary | ICD-10-CM | POA: Diagnosis not present

## 2020-04-28 DIAGNOSIS — D689 Coagulation defect, unspecified: Secondary | ICD-10-CM

## 2020-04-28 DIAGNOSIS — E119 Type 2 diabetes mellitus without complications: Secondary | ICD-10-CM | POA: Diagnosis not present

## 2020-04-28 DIAGNOSIS — E7800 Pure hypercholesterolemia, unspecified: Secondary | ICD-10-CM | POA: Diagnosis not present

## 2020-04-28 DIAGNOSIS — M79676 Pain in unspecified toe(s): Secondary | ICD-10-CM

## 2020-04-28 DIAGNOSIS — M170 Bilateral primary osteoarthritis of knee: Secondary | ICD-10-CM | POA: Diagnosis not present

## 2020-04-28 DIAGNOSIS — E785 Hyperlipidemia, unspecified: Secondary | ICD-10-CM | POA: Diagnosis not present

## 2020-04-28 DIAGNOSIS — E782 Mixed hyperlipidemia: Secondary | ICD-10-CM | POA: Diagnosis not present

## 2020-04-28 NOTE — Progress Notes (Signed)
He presents today chief complaint of painfully elongated toenails and numbness and tingling in his toes.  Objective: Pulses are palpable. Neurologic sensorium is unchanged. Toenails are long thick yellow dystrophic-like mycotic.  Assessment: History of use of blood thinner pain in limb secondary to diabetic peripheral neuropathy and painful elongated nails.  Plan: Debridement of toenails 1 through 5 bilaterally.

## 2020-05-05 DIAGNOSIS — G5603 Carpal tunnel syndrome, bilateral upper limbs: Secondary | ICD-10-CM | POA: Diagnosis not present

## 2020-05-05 DIAGNOSIS — E7800 Pure hypercholesterolemia, unspecified: Secondary | ICD-10-CM | POA: Diagnosis not present

## 2020-05-05 DIAGNOSIS — I82531 Chronic embolism and thrombosis of right popliteal vein: Secondary | ICD-10-CM | POA: Diagnosis not present

## 2020-05-05 DIAGNOSIS — Z1389 Encounter for screening for other disorder: Secondary | ICD-10-CM | POA: Diagnosis not present

## 2020-05-05 DIAGNOSIS — Z794 Long term (current) use of insulin: Secondary | ICD-10-CM | POA: Diagnosis not present

## 2020-05-05 DIAGNOSIS — Z0000 Encounter for general adult medical examination without abnormal findings: Secondary | ICD-10-CM | POA: Diagnosis not present

## 2020-05-05 DIAGNOSIS — I10 Essential (primary) hypertension: Secondary | ICD-10-CM | POA: Diagnosis not present

## 2020-05-05 DIAGNOSIS — C679 Malignant neoplasm of bladder, unspecified: Secondary | ICD-10-CM | POA: Diagnosis not present

## 2020-05-05 DIAGNOSIS — E1169 Type 2 diabetes mellitus with other specified complication: Secondary | ICD-10-CM | POA: Diagnosis not present

## 2020-05-11 DIAGNOSIS — S66317A Strain of extensor muscle, fascia and tendon of left little finger at wrist and hand level, initial encounter: Secondary | ICD-10-CM | POA: Diagnosis not present

## 2020-05-11 DIAGNOSIS — M24562 Contracture, left knee: Secondary | ICD-10-CM | POA: Diagnosis not present

## 2020-05-11 DIAGNOSIS — M24542 Contracture, left hand: Secondary | ICD-10-CM | POA: Diagnosis not present

## 2020-05-11 DIAGNOSIS — G8918 Other acute postprocedural pain: Secondary | ICD-10-CM | POA: Diagnosis not present

## 2020-05-12 DIAGNOSIS — M720 Palmar fascial fibromatosis [Dupuytren]: Secondary | ICD-10-CM | POA: Diagnosis not present

## 2020-05-12 DIAGNOSIS — M79645 Pain in left finger(s): Secondary | ICD-10-CM | POA: Diagnosis not present

## 2020-05-12 DIAGNOSIS — M79642 Pain in left hand: Secondary | ICD-10-CM | POA: Diagnosis not present

## 2020-05-12 DIAGNOSIS — M25642 Stiffness of left hand, not elsewhere classified: Secondary | ICD-10-CM | POA: Diagnosis not present

## 2020-05-13 DIAGNOSIS — M79642 Pain in left hand: Secondary | ICD-10-CM | POA: Diagnosis not present

## 2020-05-13 DIAGNOSIS — M25642 Stiffness of left hand, not elsewhere classified: Secondary | ICD-10-CM | POA: Diagnosis not present

## 2020-05-13 DIAGNOSIS — M720 Palmar fascial fibromatosis [Dupuytren]: Secondary | ICD-10-CM | POA: Diagnosis not present

## 2020-05-13 DIAGNOSIS — M79645 Pain in left finger(s): Secondary | ICD-10-CM | POA: Diagnosis not present

## 2020-05-14 DIAGNOSIS — M79645 Pain in left finger(s): Secondary | ICD-10-CM | POA: Diagnosis not present

## 2020-05-14 DIAGNOSIS — M720 Palmar fascial fibromatosis [Dupuytren]: Secondary | ICD-10-CM | POA: Diagnosis not present

## 2020-05-14 DIAGNOSIS — M79642 Pain in left hand: Secondary | ICD-10-CM | POA: Diagnosis not present

## 2020-05-14 DIAGNOSIS — M25642 Stiffness of left hand, not elsewhere classified: Secondary | ICD-10-CM | POA: Diagnosis not present

## 2020-05-15 DIAGNOSIS — M25642 Stiffness of left hand, not elsewhere classified: Secondary | ICD-10-CM | POA: Diagnosis not present

## 2020-05-15 DIAGNOSIS — M720 Palmar fascial fibromatosis [Dupuytren]: Secondary | ICD-10-CM | POA: Diagnosis not present

## 2020-05-15 DIAGNOSIS — M79645 Pain in left finger(s): Secondary | ICD-10-CM | POA: Diagnosis not present

## 2020-05-15 DIAGNOSIS — M79642 Pain in left hand: Secondary | ICD-10-CM | POA: Diagnosis not present

## 2020-05-18 DIAGNOSIS — M79645 Pain in left finger(s): Secondary | ICD-10-CM | POA: Diagnosis not present

## 2020-05-18 DIAGNOSIS — R29898 Other symptoms and signs involving the musculoskeletal system: Secondary | ICD-10-CM | POA: Diagnosis not present

## 2020-05-18 DIAGNOSIS — M25642 Stiffness of left hand, not elsewhere classified: Secondary | ICD-10-CM | POA: Diagnosis not present

## 2020-05-19 DIAGNOSIS — E1169 Type 2 diabetes mellitus with other specified complication: Secondary | ICD-10-CM | POA: Diagnosis not present

## 2020-05-19 DIAGNOSIS — E1151 Type 2 diabetes mellitus with diabetic peripheral angiopathy without gangrene: Secondary | ICD-10-CM | POA: Diagnosis not present

## 2020-05-19 DIAGNOSIS — E782 Mixed hyperlipidemia: Secondary | ICD-10-CM | POA: Diagnosis not present

## 2020-05-19 DIAGNOSIS — I10 Essential (primary) hypertension: Secondary | ICD-10-CM | POA: Diagnosis not present

## 2020-05-19 DIAGNOSIS — E7800 Pure hypercholesterolemia, unspecified: Secondary | ICD-10-CM | POA: Diagnosis not present

## 2020-05-19 DIAGNOSIS — E785 Hyperlipidemia, unspecified: Secondary | ICD-10-CM | POA: Diagnosis not present

## 2020-05-19 DIAGNOSIS — M170 Bilateral primary osteoarthritis of knee: Secondary | ICD-10-CM | POA: Diagnosis not present

## 2020-05-19 DIAGNOSIS — E119 Type 2 diabetes mellitus without complications: Secondary | ICD-10-CM | POA: Diagnosis not present

## 2020-06-18 DIAGNOSIS — E1151 Type 2 diabetes mellitus with diabetic peripheral angiopathy without gangrene: Secondary | ICD-10-CM | POA: Diagnosis not present

## 2020-06-18 DIAGNOSIS — M170 Bilateral primary osteoarthritis of knee: Secondary | ICD-10-CM | POA: Diagnosis not present

## 2020-06-18 DIAGNOSIS — E1169 Type 2 diabetes mellitus with other specified complication: Secondary | ICD-10-CM | POA: Diagnosis not present

## 2020-06-18 DIAGNOSIS — I10 Essential (primary) hypertension: Secondary | ICD-10-CM | POA: Diagnosis not present

## 2020-06-18 DIAGNOSIS — E7800 Pure hypercholesterolemia, unspecified: Secondary | ICD-10-CM | POA: Diagnosis not present

## 2020-06-18 DIAGNOSIS — E782 Mixed hyperlipidemia: Secondary | ICD-10-CM | POA: Diagnosis not present

## 2020-06-19 DIAGNOSIS — M25642 Stiffness of left hand, not elsewhere classified: Secondary | ICD-10-CM | POA: Diagnosis not present

## 2020-06-19 DIAGNOSIS — R29898 Other symptoms and signs involving the musculoskeletal system: Secondary | ICD-10-CM | POA: Diagnosis not present

## 2020-06-19 DIAGNOSIS — M79642 Pain in left hand: Secondary | ICD-10-CM | POA: Diagnosis not present

## 2020-06-19 DIAGNOSIS — M79645 Pain in left finger(s): Secondary | ICD-10-CM | POA: Diagnosis not present

## 2020-06-22 DIAGNOSIS — M25642 Stiffness of left hand, not elsewhere classified: Secondary | ICD-10-CM | POA: Diagnosis not present

## 2020-06-22 DIAGNOSIS — M79645 Pain in left finger(s): Secondary | ICD-10-CM | POA: Diagnosis not present

## 2020-06-22 DIAGNOSIS — R29898 Other symptoms and signs involving the musculoskeletal system: Secondary | ICD-10-CM | POA: Diagnosis not present

## 2020-06-22 DIAGNOSIS — M79642 Pain in left hand: Secondary | ICD-10-CM | POA: Diagnosis not present

## 2020-06-25 DIAGNOSIS — M25642 Stiffness of left hand, not elsewhere classified: Secondary | ICD-10-CM | POA: Diagnosis not present

## 2020-06-25 DIAGNOSIS — M79642 Pain in left hand: Secondary | ICD-10-CM | POA: Diagnosis not present

## 2020-06-25 DIAGNOSIS — R29898 Other symptoms and signs involving the musculoskeletal system: Secondary | ICD-10-CM | POA: Diagnosis not present

## 2020-06-25 DIAGNOSIS — M79645 Pain in left finger(s): Secondary | ICD-10-CM | POA: Diagnosis not present

## 2020-06-29 DIAGNOSIS — M79645 Pain in left finger(s): Secondary | ICD-10-CM | POA: Diagnosis not present

## 2020-06-29 DIAGNOSIS — M79642 Pain in left hand: Secondary | ICD-10-CM | POA: Diagnosis not present

## 2020-06-29 DIAGNOSIS — M25642 Stiffness of left hand, not elsewhere classified: Secondary | ICD-10-CM | POA: Diagnosis not present

## 2020-07-02 DIAGNOSIS — M79642 Pain in left hand: Secondary | ICD-10-CM | POA: Diagnosis not present

## 2020-07-02 DIAGNOSIS — M25642 Stiffness of left hand, not elsewhere classified: Secondary | ICD-10-CM | POA: Diagnosis not present

## 2020-07-02 DIAGNOSIS — M720 Palmar fascial fibromatosis [Dupuytren]: Secondary | ICD-10-CM | POA: Diagnosis not present

## 2020-07-02 DIAGNOSIS — M79645 Pain in left finger(s): Secondary | ICD-10-CM | POA: Diagnosis not present

## 2020-07-20 DIAGNOSIS — M720 Palmar fascial fibromatosis [Dupuytren]: Secondary | ICD-10-CM | POA: Diagnosis not present

## 2020-07-20 DIAGNOSIS — M79642 Pain in left hand: Secondary | ICD-10-CM | POA: Diagnosis not present

## 2020-07-20 DIAGNOSIS — M25642 Stiffness of left hand, not elsewhere classified: Secondary | ICD-10-CM | POA: Diagnosis not present

## 2020-07-20 DIAGNOSIS — M79645 Pain in left finger(s): Secondary | ICD-10-CM | POA: Diagnosis not present

## 2020-07-22 DIAGNOSIS — E1151 Type 2 diabetes mellitus with diabetic peripheral angiopathy without gangrene: Secondary | ICD-10-CM | POA: Diagnosis not present

## 2020-07-22 DIAGNOSIS — M170 Bilateral primary osteoarthritis of knee: Secondary | ICD-10-CM | POA: Diagnosis not present

## 2020-07-22 DIAGNOSIS — E7800 Pure hypercholesterolemia, unspecified: Secondary | ICD-10-CM | POA: Diagnosis not present

## 2020-07-22 DIAGNOSIS — I10 Essential (primary) hypertension: Secondary | ICD-10-CM | POA: Diagnosis not present

## 2020-07-22 DIAGNOSIS — E782 Mixed hyperlipidemia: Secondary | ICD-10-CM | POA: Diagnosis not present

## 2020-07-22 DIAGNOSIS — E1169 Type 2 diabetes mellitus with other specified complication: Secondary | ICD-10-CM | POA: Diagnosis not present

## 2020-07-22 DIAGNOSIS — E119 Type 2 diabetes mellitus without complications: Secondary | ICD-10-CM | POA: Diagnosis not present

## 2020-07-28 ENCOUNTER — Encounter: Payer: Self-pay | Admitting: Podiatry

## 2020-07-28 ENCOUNTER — Ambulatory Visit (INDEPENDENT_AMBULATORY_CARE_PROVIDER_SITE_OTHER): Payer: Medicare Other | Admitting: Podiatry

## 2020-07-28 ENCOUNTER — Other Ambulatory Visit: Payer: Self-pay

## 2020-07-28 DIAGNOSIS — B351 Tinea unguium: Secondary | ICD-10-CM | POA: Diagnosis not present

## 2020-07-28 DIAGNOSIS — D689 Coagulation defect, unspecified: Secondary | ICD-10-CM

## 2020-07-28 DIAGNOSIS — E1142 Type 2 diabetes mellitus with diabetic polyneuropathy: Secondary | ICD-10-CM | POA: Diagnosis not present

## 2020-07-28 DIAGNOSIS — M79676 Pain in unspecified toe(s): Secondary | ICD-10-CM | POA: Diagnosis not present

## 2020-07-28 NOTE — Progress Notes (Signed)
He presents today for chief complaint of painfully elongated toenails numbness and tingling in his feet.  Objective: Toenails are long thick yellow dystrophic-like mycotic no changes in neuropathy in the feet.  Pulses are strongly palpable.  Assessment: Pain in limb secondary to diabetic peripheral neuropathy painful elongated toenails.  Plan: Debrided toenails 1 through 5 bilaterally today he is on blood thinner.  Follow-up with me in 3 months

## 2020-07-31 DIAGNOSIS — M79642 Pain in left hand: Secondary | ICD-10-CM | POA: Diagnosis not present

## 2020-07-31 DIAGNOSIS — M25642 Stiffness of left hand, not elsewhere classified: Secondary | ICD-10-CM | POA: Diagnosis not present

## 2020-07-31 DIAGNOSIS — M79645 Pain in left finger(s): Secondary | ICD-10-CM | POA: Diagnosis not present

## 2020-07-31 DIAGNOSIS — M720 Palmar fascial fibromatosis [Dupuytren]: Secondary | ICD-10-CM | POA: Diagnosis not present

## 2020-08-04 DIAGNOSIS — M79642 Pain in left hand: Secondary | ICD-10-CM | POA: Diagnosis not present

## 2020-08-04 DIAGNOSIS — M720 Palmar fascial fibromatosis [Dupuytren]: Secondary | ICD-10-CM | POA: Diagnosis not present

## 2020-08-04 DIAGNOSIS — M25642 Stiffness of left hand, not elsewhere classified: Secondary | ICD-10-CM | POA: Diagnosis not present

## 2020-08-06 DIAGNOSIS — M25642 Stiffness of left hand, not elsewhere classified: Secondary | ICD-10-CM | POA: Diagnosis not present

## 2020-08-06 DIAGNOSIS — R29898 Other symptoms and signs involving the musculoskeletal system: Secondary | ICD-10-CM | POA: Diagnosis not present

## 2020-08-10 DIAGNOSIS — M25642 Stiffness of left hand, not elsewhere classified: Secondary | ICD-10-CM | POA: Diagnosis not present

## 2020-08-11 DIAGNOSIS — C678 Malignant neoplasm of overlapping sites of bladder: Secondary | ICD-10-CM | POA: Diagnosis not present

## 2020-08-12 DIAGNOSIS — M79645 Pain in left finger(s): Secondary | ICD-10-CM | POA: Diagnosis not present

## 2020-08-12 DIAGNOSIS — M79642 Pain in left hand: Secondary | ICD-10-CM | POA: Diagnosis not present

## 2020-08-12 DIAGNOSIS — R29898 Other symptoms and signs involving the musculoskeletal system: Secondary | ICD-10-CM | POA: Diagnosis not present

## 2020-08-12 DIAGNOSIS — M25642 Stiffness of left hand, not elsewhere classified: Secondary | ICD-10-CM | POA: Diagnosis not present

## 2020-08-12 DIAGNOSIS — M720 Palmar fascial fibromatosis [Dupuytren]: Secondary | ICD-10-CM | POA: Diagnosis not present

## 2020-08-24 DIAGNOSIS — E782 Mixed hyperlipidemia: Secondary | ICD-10-CM | POA: Diagnosis not present

## 2020-08-24 DIAGNOSIS — M720 Palmar fascial fibromatosis [Dupuytren]: Secondary | ICD-10-CM | POA: Diagnosis not present

## 2020-08-24 DIAGNOSIS — E1169 Type 2 diabetes mellitus with other specified complication: Secondary | ICD-10-CM | POA: Diagnosis not present

## 2020-08-24 DIAGNOSIS — M170 Bilateral primary osteoarthritis of knee: Secondary | ICD-10-CM | POA: Diagnosis not present

## 2020-08-24 DIAGNOSIS — E7800 Pure hypercholesterolemia, unspecified: Secondary | ICD-10-CM | POA: Diagnosis not present

## 2020-08-24 DIAGNOSIS — E119 Type 2 diabetes mellitus without complications: Secondary | ICD-10-CM | POA: Diagnosis not present

## 2020-08-24 DIAGNOSIS — E785 Hyperlipidemia, unspecified: Secondary | ICD-10-CM | POA: Diagnosis not present

## 2020-08-24 DIAGNOSIS — E1151 Type 2 diabetes mellitus with diabetic peripheral angiopathy without gangrene: Secondary | ICD-10-CM | POA: Diagnosis not present

## 2020-08-24 DIAGNOSIS — M79642 Pain in left hand: Secondary | ICD-10-CM | POA: Diagnosis not present

## 2020-08-24 DIAGNOSIS — I10 Essential (primary) hypertension: Secondary | ICD-10-CM | POA: Diagnosis not present

## 2020-09-04 DIAGNOSIS — M9901 Segmental and somatic dysfunction of cervical region: Secondary | ICD-10-CM | POA: Diagnosis not present

## 2020-09-04 DIAGNOSIS — M542 Cervicalgia: Secondary | ICD-10-CM | POA: Diagnosis not present

## 2020-09-07 DIAGNOSIS — M9901 Segmental and somatic dysfunction of cervical region: Secondary | ICD-10-CM | POA: Diagnosis not present

## 2020-09-07 DIAGNOSIS — M542 Cervicalgia: Secondary | ICD-10-CM | POA: Diagnosis not present

## 2020-09-18 DIAGNOSIS — M9901 Segmental and somatic dysfunction of cervical region: Secondary | ICD-10-CM | POA: Diagnosis not present

## 2020-09-18 DIAGNOSIS — M542 Cervicalgia: Secondary | ICD-10-CM | POA: Diagnosis not present

## 2020-10-22 DIAGNOSIS — E785 Hyperlipidemia, unspecified: Secondary | ICD-10-CM | POA: Diagnosis not present

## 2020-10-22 DIAGNOSIS — E782 Mixed hyperlipidemia: Secondary | ICD-10-CM | POA: Diagnosis not present

## 2020-10-22 DIAGNOSIS — E119 Type 2 diabetes mellitus without complications: Secondary | ICD-10-CM | POA: Diagnosis not present

## 2020-10-22 DIAGNOSIS — M170 Bilateral primary osteoarthritis of knee: Secondary | ICD-10-CM | POA: Diagnosis not present

## 2020-10-22 DIAGNOSIS — E7800 Pure hypercholesterolemia, unspecified: Secondary | ICD-10-CM | POA: Diagnosis not present

## 2020-10-22 DIAGNOSIS — I10 Essential (primary) hypertension: Secondary | ICD-10-CM | POA: Diagnosis not present

## 2020-10-22 DIAGNOSIS — E1151 Type 2 diabetes mellitus with diabetic peripheral angiopathy without gangrene: Secondary | ICD-10-CM | POA: Diagnosis not present

## 2020-11-12 DIAGNOSIS — Z23 Encounter for immunization: Secondary | ICD-10-CM | POA: Diagnosis not present

## 2020-11-19 DIAGNOSIS — I10 Essential (primary) hypertension: Secondary | ICD-10-CM | POA: Diagnosis not present

## 2020-11-19 DIAGNOSIS — E1169 Type 2 diabetes mellitus with other specified complication: Secondary | ICD-10-CM | POA: Diagnosis not present

## 2020-11-19 DIAGNOSIS — Z7984 Long term (current) use of oral hypoglycemic drugs: Secondary | ICD-10-CM | POA: Diagnosis not present

## 2020-11-19 DIAGNOSIS — Z794 Long term (current) use of insulin: Secondary | ICD-10-CM | POA: Diagnosis not present

## 2020-11-19 DIAGNOSIS — C678 Malignant neoplasm of overlapping sites of bladder: Secondary | ICD-10-CM | POA: Diagnosis not present

## 2020-11-19 DIAGNOSIS — R911 Solitary pulmonary nodule: Secondary | ICD-10-CM | POA: Diagnosis not present

## 2020-11-19 DIAGNOSIS — E7800 Pure hypercholesterolemia, unspecified: Secondary | ICD-10-CM | POA: Diagnosis not present

## 2020-11-20 ENCOUNTER — Other Ambulatory Visit: Payer: Self-pay | Admitting: Internal Medicine

## 2020-11-20 DIAGNOSIS — R911 Solitary pulmonary nodule: Secondary | ICD-10-CM

## 2020-11-24 ENCOUNTER — Ambulatory Visit (INDEPENDENT_AMBULATORY_CARE_PROVIDER_SITE_OTHER): Payer: Medicare Other | Admitting: Podiatry

## 2020-11-24 ENCOUNTER — Other Ambulatory Visit: Payer: Self-pay

## 2020-11-24 DIAGNOSIS — M170 Bilateral primary osteoarthritis of knee: Secondary | ICD-10-CM | POA: Diagnosis not present

## 2020-11-24 DIAGNOSIS — M79676 Pain in unspecified toe(s): Secondary | ICD-10-CM

## 2020-11-24 DIAGNOSIS — B351 Tinea unguium: Secondary | ICD-10-CM | POA: Diagnosis not present

## 2020-11-24 DIAGNOSIS — E1142 Type 2 diabetes mellitus with diabetic polyneuropathy: Secondary | ICD-10-CM | POA: Diagnosis not present

## 2020-11-24 DIAGNOSIS — E119 Type 2 diabetes mellitus without complications: Secondary | ICD-10-CM | POA: Diagnosis not present

## 2020-11-24 DIAGNOSIS — I10 Essential (primary) hypertension: Secondary | ICD-10-CM | POA: Diagnosis not present

## 2020-11-24 DIAGNOSIS — E1151 Type 2 diabetes mellitus with diabetic peripheral angiopathy without gangrene: Secondary | ICD-10-CM | POA: Diagnosis not present

## 2020-11-24 DIAGNOSIS — E782 Mixed hyperlipidemia: Secondary | ICD-10-CM | POA: Diagnosis not present

## 2020-11-24 DIAGNOSIS — E7800 Pure hypercholesterolemia, unspecified: Secondary | ICD-10-CM | POA: Diagnosis not present

## 2020-11-24 DIAGNOSIS — D689 Coagulation defect, unspecified: Secondary | ICD-10-CM | POA: Diagnosis not present

## 2020-11-24 DIAGNOSIS — E785 Hyperlipidemia, unspecified: Secondary | ICD-10-CM | POA: Diagnosis not present

## 2020-11-24 DIAGNOSIS — E1169 Type 2 diabetes mellitus with other specified complication: Secondary | ICD-10-CM | POA: Diagnosis not present

## 2020-11-24 NOTE — Progress Notes (Signed)
He presents today as a diabetic with a history of diabetic peripheral neuropathy history of bladder cancer.  States that his hemoglobin A1c is a little more elevated than it has been in the past.  Denies any trauma to the feet.  Denies fever chills nausea vomiting muscle aches and pains.  States his toenails are long and need trimming he can trim them himself.  Objective: Vital signs are stable he is alert and oriented x3.  Pulses are palpable.  Neurologic Sirmans slightly diminished per Semmes Weinstein monofilament otherwise deep tendon reflexes are intact.  Toenails are thick yellow dystrophic clinically mycotic painful palpation no open lesions or wounds are noted.  Skin appears to be intact with normal turgor.  Assessment: Diabetic peripheral neuropathy with pain in limb secondary to onychomycosis.  Plan: Debridement of toenails 1 through 5 bilateral.  Follow-up with him in 3 months.

## 2020-12-10 ENCOUNTER — Other Ambulatory Visit: Payer: Self-pay

## 2020-12-10 ENCOUNTER — Ambulatory Visit
Admission: RE | Admit: 2020-12-10 | Discharge: 2020-12-10 | Disposition: A | Discharge status: Home / Self Care | Payer: Medicare Other | Source: Ambulatory Visit | Attending: Internal Medicine | Admitting: Internal Medicine

## 2020-12-10 DIAGNOSIS — I700 Atherosclerosis of aorta: Secondary | ICD-10-CM | POA: Diagnosis not present

## 2020-12-10 DIAGNOSIS — J439 Emphysema, unspecified: Secondary | ICD-10-CM | POA: Diagnosis not present

## 2020-12-10 DIAGNOSIS — R918 Other nonspecific abnormal finding of lung field: Secondary | ICD-10-CM | POA: Diagnosis not present

## 2020-12-10 DIAGNOSIS — R911 Solitary pulmonary nodule: Secondary | ICD-10-CM | POA: Diagnosis not present

## 2020-12-11 ENCOUNTER — Other Ambulatory Visit: Payer: Medicare Other

## 2021-01-26 DIAGNOSIS — Z961 Presence of intraocular lens: Secondary | ICD-10-CM | POA: Diagnosis not present

## 2021-01-26 DIAGNOSIS — H52203 Unspecified astigmatism, bilateral: Secondary | ICD-10-CM | POA: Diagnosis not present

## 2021-01-26 DIAGNOSIS — H11003 Unspecified pterygium of eye, bilateral: Secondary | ICD-10-CM | POA: Diagnosis not present

## 2021-01-26 DIAGNOSIS — E119 Type 2 diabetes mellitus without complications: Secondary | ICD-10-CM | POA: Diagnosis not present

## 2021-02-15 DIAGNOSIS — C678 Malignant neoplasm of overlapping sites of bladder: Secondary | ICD-10-CM | POA: Diagnosis not present

## 2021-02-22 DIAGNOSIS — E1151 Type 2 diabetes mellitus with diabetic peripheral angiopathy without gangrene: Secondary | ICD-10-CM | POA: Diagnosis not present

## 2021-02-22 DIAGNOSIS — I10 Essential (primary) hypertension: Secondary | ICD-10-CM | POA: Diagnosis not present

## 2021-02-22 DIAGNOSIS — E782 Mixed hyperlipidemia: Secondary | ICD-10-CM | POA: Diagnosis not present

## 2021-02-22 DIAGNOSIS — E119 Type 2 diabetes mellitus without complications: Secondary | ICD-10-CM | POA: Diagnosis not present

## 2021-02-22 DIAGNOSIS — E1169 Type 2 diabetes mellitus with other specified complication: Secondary | ICD-10-CM | POA: Diagnosis not present

## 2021-02-22 DIAGNOSIS — E785 Hyperlipidemia, unspecified: Secondary | ICD-10-CM | POA: Diagnosis not present

## 2021-02-22 DIAGNOSIS — M170 Bilateral primary osteoarthritis of knee: Secondary | ICD-10-CM | POA: Diagnosis not present

## 2021-02-22 DIAGNOSIS — E7800 Pure hypercholesterolemia, unspecified: Secondary | ICD-10-CM | POA: Diagnosis not present

## 2021-02-25 ENCOUNTER — Encounter: Payer: Self-pay | Admitting: Podiatry

## 2021-02-25 ENCOUNTER — Other Ambulatory Visit: Payer: Self-pay

## 2021-02-25 ENCOUNTER — Ambulatory Visit (INDEPENDENT_AMBULATORY_CARE_PROVIDER_SITE_OTHER): Payer: Medicare Other | Admitting: Podiatry

## 2021-02-25 DIAGNOSIS — D689 Coagulation defect, unspecified: Secondary | ICD-10-CM

## 2021-02-25 DIAGNOSIS — B351 Tinea unguium: Secondary | ICD-10-CM

## 2021-02-25 DIAGNOSIS — E1142 Type 2 diabetes mellitus with diabetic polyneuropathy: Secondary | ICD-10-CM | POA: Diagnosis not present

## 2021-02-25 DIAGNOSIS — M79676 Pain in unspecified toe(s): Secondary | ICD-10-CM | POA: Diagnosis not present

## 2021-02-25 DIAGNOSIS — Z23 Encounter for immunization: Secondary | ICD-10-CM | POA: Diagnosis not present

## 2021-02-26 NOTE — Progress Notes (Signed)
He presents today for follow-up of his painful elongated toenails and calluses bilaterally.  He states that his blood sugars seem to be doing better with different medication.  Objective: Vital signs are stable alert and oriented x3.  Pulses are palpable.  Decreased sensorium per Semmes Weinstein monofilament and decreased capillary fill time.  Toenails are long thick yellow dystrophic and clinically mycotic small hyperkeratotic lesions.  Assessment: Benign skin lesions pain limb secondary onychomycosis and diabetic peripheral neuropathy.  Plan: Debrided benign skin lesions debrided nails 1 through 5 bilaterally.  Remember to comment on the salsa and jalapenos.

## 2021-04-05 DIAGNOSIS — Z23 Encounter for immunization: Secondary | ICD-10-CM | POA: Diagnosis not present

## 2021-04-27 DIAGNOSIS — E119 Type 2 diabetes mellitus without complications: Secondary | ICD-10-CM | POA: Diagnosis not present

## 2021-04-27 DIAGNOSIS — E1169 Type 2 diabetes mellitus with other specified complication: Secondary | ICD-10-CM | POA: Diagnosis not present

## 2021-04-27 DIAGNOSIS — E7800 Pure hypercholesterolemia, unspecified: Secondary | ICD-10-CM | POA: Diagnosis not present

## 2021-04-27 DIAGNOSIS — M170 Bilateral primary osteoarthritis of knee: Secondary | ICD-10-CM | POA: Diagnosis not present

## 2021-04-27 DIAGNOSIS — E782 Mixed hyperlipidemia: Secondary | ICD-10-CM | POA: Diagnosis not present

## 2021-04-27 DIAGNOSIS — I10 Essential (primary) hypertension: Secondary | ICD-10-CM | POA: Diagnosis not present

## 2021-04-27 DIAGNOSIS — E1151 Type 2 diabetes mellitus with diabetic peripheral angiopathy without gangrene: Secondary | ICD-10-CM | POA: Diagnosis not present

## 2021-05-17 DIAGNOSIS — E1169 Type 2 diabetes mellitus with other specified complication: Secondary | ICD-10-CM | POA: Diagnosis not present

## 2021-05-17 DIAGNOSIS — E119 Type 2 diabetes mellitus without complications: Secondary | ICD-10-CM | POA: Diagnosis not present

## 2021-05-17 DIAGNOSIS — E1151 Type 2 diabetes mellitus with diabetic peripheral angiopathy without gangrene: Secondary | ICD-10-CM | POA: Diagnosis not present

## 2021-05-17 DIAGNOSIS — E7800 Pure hypercholesterolemia, unspecified: Secondary | ICD-10-CM | POA: Diagnosis not present

## 2021-05-17 DIAGNOSIS — E782 Mixed hyperlipidemia: Secondary | ICD-10-CM | POA: Diagnosis not present

## 2021-05-17 DIAGNOSIS — I10 Essential (primary) hypertension: Secondary | ICD-10-CM | POA: Diagnosis not present

## 2021-05-17 DIAGNOSIS — E785 Hyperlipidemia, unspecified: Secondary | ICD-10-CM | POA: Diagnosis not present

## 2021-05-17 DIAGNOSIS — M170 Bilateral primary osteoarthritis of knee: Secondary | ICD-10-CM | POA: Diagnosis not present

## 2021-05-17 DIAGNOSIS — E1140 Type 2 diabetes mellitus with diabetic neuropathy, unspecified: Secondary | ICD-10-CM | POA: Diagnosis not present

## 2021-05-27 ENCOUNTER — Other Ambulatory Visit: Payer: Self-pay

## 2021-05-27 ENCOUNTER — Encounter: Payer: Self-pay | Admitting: Podiatry

## 2021-05-27 ENCOUNTER — Ambulatory Visit (INDEPENDENT_AMBULATORY_CARE_PROVIDER_SITE_OTHER): Payer: Medicare Other | Admitting: Podiatry

## 2021-05-27 DIAGNOSIS — M79676 Pain in unspecified toe(s): Secondary | ICD-10-CM

## 2021-05-27 DIAGNOSIS — B351 Tinea unguium: Secondary | ICD-10-CM

## 2021-05-27 DIAGNOSIS — D689 Coagulation defect, unspecified: Secondary | ICD-10-CM

## 2021-05-27 DIAGNOSIS — E1142 Type 2 diabetes mellitus with diabetic polyneuropathy: Secondary | ICD-10-CM | POA: Diagnosis not present

## 2021-05-27 NOTE — Progress Notes (Signed)
He presents today chief complaint of painful elongated toenails and some numbness and tingling in the bilateral foot.  Objective: Vital signs are stable he is alert and oriented x3 pulses are strongly palpable.  Neurologic sensorium is intact though he does have some loss of sensation per Semmes Weinstein monofilament but it is not bad.  It is still sporadic.Marland Kitchen  Deep tendon reflexes are intact.  Muscle strength is normal symmetrical bilateral.  No open lesions or wounds all joints distal to the ankle and full range of motion without crepitation.  Nails are thick yellow dystrophic clinical mycotic.  Assessment: Pain in limb secondary to onychomycosis diabetic peripheral neuropathy early stages.  Plan: Encouraged him to continue to watch his feet every day and he also needs to apply lotion to the feet and heels.  As well as his legs.  I would like to follow-up with him in about 3 months we did debride nails 1 through 5 today.

## 2021-06-01 DIAGNOSIS — Z0000 Encounter for general adult medical examination without abnormal findings: Secondary | ICD-10-CM | POA: Diagnosis not present

## 2021-06-01 DIAGNOSIS — R0989 Other specified symptoms and signs involving the circulatory and respiratory systems: Secondary | ICD-10-CM | POA: Diagnosis not present

## 2021-06-01 DIAGNOSIS — E7800 Pure hypercholesterolemia, unspecified: Secondary | ICD-10-CM | POA: Diagnosis not present

## 2021-06-01 DIAGNOSIS — I2510 Atherosclerotic heart disease of native coronary artery without angina pectoris: Secondary | ICD-10-CM | POA: Diagnosis not present

## 2021-06-01 DIAGNOSIS — Z1389 Encounter for screening for other disorder: Secondary | ICD-10-CM | POA: Diagnosis not present

## 2021-06-01 DIAGNOSIS — E1140 Type 2 diabetes mellitus with diabetic neuropathy, unspecified: Secondary | ICD-10-CM | POA: Diagnosis not present

## 2021-06-01 DIAGNOSIS — C678 Malignant neoplasm of overlapping sites of bladder: Secondary | ICD-10-CM | POA: Diagnosis not present

## 2021-06-01 DIAGNOSIS — Z7984 Long term (current) use of oral hypoglycemic drugs: Secondary | ICD-10-CM | POA: Diagnosis not present

## 2021-06-01 DIAGNOSIS — I10 Essential (primary) hypertension: Secondary | ICD-10-CM | POA: Diagnosis not present

## 2021-06-01 DIAGNOSIS — Z794 Long term (current) use of insulin: Secondary | ICD-10-CM | POA: Diagnosis not present

## 2021-06-01 DIAGNOSIS — I82531 Chronic embolism and thrombosis of right popliteal vein: Secondary | ICD-10-CM | POA: Diagnosis not present

## 2021-06-08 DIAGNOSIS — E1140 Type 2 diabetes mellitus with diabetic neuropathy, unspecified: Secondary | ICD-10-CM | POA: Diagnosis not present

## 2021-06-08 DIAGNOSIS — E7800 Pure hypercholesterolemia, unspecified: Secondary | ICD-10-CM | POA: Diagnosis not present

## 2021-06-08 DIAGNOSIS — I10 Essential (primary) hypertension: Secondary | ICD-10-CM | POA: Diagnosis not present

## 2021-06-08 DIAGNOSIS — E1151 Type 2 diabetes mellitus with diabetic peripheral angiopathy without gangrene: Secondary | ICD-10-CM | POA: Diagnosis not present

## 2021-06-08 DIAGNOSIS — E1169 Type 2 diabetes mellitus with other specified complication: Secondary | ICD-10-CM | POA: Diagnosis not present

## 2021-06-08 DIAGNOSIS — M170 Bilateral primary osteoarthritis of knee: Secondary | ICD-10-CM | POA: Diagnosis not present

## 2021-06-08 DIAGNOSIS — I2510 Atherosclerotic heart disease of native coronary artery without angina pectoris: Secondary | ICD-10-CM | POA: Diagnosis not present

## 2021-06-08 DIAGNOSIS — E119 Type 2 diabetes mellitus without complications: Secondary | ICD-10-CM | POA: Diagnosis not present

## 2021-06-08 DIAGNOSIS — E782 Mixed hyperlipidemia: Secondary | ICD-10-CM | POA: Diagnosis not present

## 2021-07-02 DIAGNOSIS — E119 Type 2 diabetes mellitus without complications: Secondary | ICD-10-CM | POA: Diagnosis not present

## 2021-07-21 DIAGNOSIS — I10 Essential (primary) hypertension: Secondary | ICD-10-CM | POA: Diagnosis not present

## 2021-07-21 DIAGNOSIS — E1169 Type 2 diabetes mellitus with other specified complication: Secondary | ICD-10-CM | POA: Diagnosis not present

## 2021-07-21 DIAGNOSIS — E782 Mixed hyperlipidemia: Secondary | ICD-10-CM | POA: Diagnosis not present

## 2021-08-03 DIAGNOSIS — C672 Malignant neoplasm of lateral wall of bladder: Secondary | ICD-10-CM | POA: Diagnosis not present

## 2021-08-12 ENCOUNTER — Encounter: Payer: Self-pay | Admitting: Podiatry

## 2021-08-12 ENCOUNTER — Ambulatory Visit (INDEPENDENT_AMBULATORY_CARE_PROVIDER_SITE_OTHER): Payer: Medicare Other | Admitting: Podiatry

## 2021-08-12 ENCOUNTER — Other Ambulatory Visit: Payer: Self-pay

## 2021-08-12 DIAGNOSIS — E1142 Type 2 diabetes mellitus with diabetic polyneuropathy: Secondary | ICD-10-CM | POA: Diagnosis not present

## 2021-08-12 DIAGNOSIS — M79676 Pain in unspecified toe(s): Secondary | ICD-10-CM | POA: Diagnosis not present

## 2021-08-12 DIAGNOSIS — B351 Tinea unguium: Secondary | ICD-10-CM | POA: Diagnosis not present

## 2021-08-12 DIAGNOSIS — D689 Coagulation defect, unspecified: Secondary | ICD-10-CM | POA: Diagnosis not present

## 2021-08-12 NOTE — Progress Notes (Signed)
He presents today chief complaint diabetic peripheral neuropathy and painful elongated toenails he does have a history of blood thinner. ? ?Objective: Vital signs stable alert and oriented x3 pulses are palpable capillary fill time is somewhat limited but it is cool in the room.  Neurologic sensorium is unchanged diminished per Semmes Weinstein monofilament.  Toenails are long dystrophic clinically mycotic.  Hemoglobin A1c is 10.4 ? ?Assessment: Pain in limb secondary to diabetic peripheral neuropathy.  Pain in limb secondary to onychomycosis. ? ?Plan: Debridement of toenails today 1 through 5 bilaterally no iatrogenic lesions noted.  Follow-up with him once he returns from [place]. ?

## 2021-08-25 DIAGNOSIS — I10 Essential (primary) hypertension: Secondary | ICD-10-CM | POA: Diagnosis not present

## 2021-08-25 DIAGNOSIS — E782 Mixed hyperlipidemia: Secondary | ICD-10-CM | POA: Diagnosis not present

## 2021-08-25 DIAGNOSIS — E1140 Type 2 diabetes mellitus with diabetic neuropathy, unspecified: Secondary | ICD-10-CM | POA: Diagnosis not present

## 2021-08-26 ENCOUNTER — Ambulatory Visit: Payer: Medicare Other | Admitting: Podiatry

## 2021-09-30 DIAGNOSIS — E119 Type 2 diabetes mellitus without complications: Secondary | ICD-10-CM | POA: Diagnosis not present

## 2021-10-11 DIAGNOSIS — E782 Mixed hyperlipidemia: Secondary | ICD-10-CM | POA: Diagnosis not present

## 2021-10-11 DIAGNOSIS — I10 Essential (primary) hypertension: Secondary | ICD-10-CM | POA: Diagnosis not present

## 2021-10-11 DIAGNOSIS — E119 Type 2 diabetes mellitus without complications: Secondary | ICD-10-CM | POA: Diagnosis not present

## 2021-10-11 DIAGNOSIS — E1169 Type 2 diabetes mellitus with other specified complication: Secondary | ICD-10-CM | POA: Diagnosis not present

## 2021-10-11 DIAGNOSIS — M170 Bilateral primary osteoarthritis of knee: Secondary | ICD-10-CM | POA: Diagnosis not present

## 2021-11-03 ENCOUNTER — Ambulatory Visit
Admission: RE | Admit: 2021-11-03 | Discharge: 2021-11-03 | Disposition: A | Discharge status: Home / Self Care | Payer: Medicare Other | Source: Ambulatory Visit | Attending: Internal Medicine | Admitting: Internal Medicine

## 2021-11-03 ENCOUNTER — Other Ambulatory Visit: Payer: Self-pay | Admitting: Internal Medicine

## 2021-11-03 DIAGNOSIS — R0602 Shortness of breath: Secondary | ICD-10-CM

## 2021-11-16 ENCOUNTER — Ambulatory Visit: Payer: Medicare Other | Admitting: Podiatry

## 2021-11-16 DIAGNOSIS — B351 Tinea unguium: Secondary | ICD-10-CM | POA: Diagnosis not present

## 2021-11-16 DIAGNOSIS — D689 Coagulation defect, unspecified: Secondary | ICD-10-CM | POA: Diagnosis not present

## 2021-11-16 DIAGNOSIS — E1142 Type 2 diabetes mellitus with diabetic polyneuropathy: Secondary | ICD-10-CM | POA: Diagnosis not present

## 2021-11-16 DIAGNOSIS — M79676 Pain in unspecified toe(s): Secondary | ICD-10-CM | POA: Diagnosis not present

## 2021-11-16 NOTE — Progress Notes (Signed)
He presents today chief complaint of painful elongated toenails 1 through 5 bilaterally.  He still retains diabetic peripheral neuropathy.  Objective: Vital signs are stable he is alert oriented x3 pulses remain palpable neurologic sensorium is diminished per Semmes Weinstein monofilament.  Muscle strength is normal and symmetrical bilateral.  Toenails are long thick yellow dystrophic-like mycotic.  Assessment: Pain in limb secondary to onychomycosis diabetic peripheral neuropathy blood thinner.  Plan: Debrided toenails 1 through 5 bilateral.

## 2021-12-13 DIAGNOSIS — R0601 Orthopnea: Secondary | ICD-10-CM | POA: Diagnosis not present

## 2021-12-13 DIAGNOSIS — R079 Chest pain, unspecified: Secondary | ICD-10-CM | POA: Diagnosis not present

## 2021-12-15 DIAGNOSIS — L570 Actinic keratosis: Secondary | ICD-10-CM | POA: Diagnosis not present

## 2021-12-15 DIAGNOSIS — D0439 Carcinoma in situ of skin of other parts of face: Secondary | ICD-10-CM | POA: Diagnosis not present

## 2021-12-15 DIAGNOSIS — L218 Other seborrheic dermatitis: Secondary | ICD-10-CM | POA: Diagnosis not present

## 2021-12-15 DIAGNOSIS — L814 Other melanin hyperpigmentation: Secondary | ICD-10-CM | POA: Diagnosis not present

## 2021-12-17 DIAGNOSIS — C679 Malignant neoplasm of bladder, unspecified: Secondary | ICD-10-CM | POA: Diagnosis not present

## 2021-12-17 DIAGNOSIS — R0601 Orthopnea: Secondary | ICD-10-CM | POA: Diagnosis not present

## 2021-12-17 DIAGNOSIS — I10 Essential (primary) hypertension: Secondary | ICD-10-CM | POA: Diagnosis not present

## 2021-12-17 DIAGNOSIS — I2510 Atherosclerotic heart disease of native coronary artery without angina pectoris: Secondary | ICD-10-CM | POA: Diagnosis not present

## 2021-12-17 DIAGNOSIS — E1140 Type 2 diabetes mellitus with diabetic neuropathy, unspecified: Secondary | ICD-10-CM | POA: Diagnosis not present

## 2021-12-17 DIAGNOSIS — E7800 Pure hypercholesterolemia, unspecified: Secondary | ICD-10-CM | POA: Diagnosis not present

## 2021-12-31 DIAGNOSIS — H905 Unspecified sensorineural hearing loss: Secondary | ICD-10-CM | POA: Diagnosis not present

## 2022-01-03 DIAGNOSIS — E119 Type 2 diabetes mellitus without complications: Secondary | ICD-10-CM | POA: Diagnosis not present

## 2022-01-04 ENCOUNTER — Encounter: Payer: Self-pay | Admitting: Cardiology

## 2022-01-04 ENCOUNTER — Ambulatory Visit: Payer: Medicare Other | Admitting: Cardiology

## 2022-01-04 VITALS — BP 110/60 | HR 63 | Ht 72.0 in | Wt 182.0 lb

## 2022-01-04 DIAGNOSIS — I2699 Other pulmonary embolism without acute cor pulmonale: Secondary | ICD-10-CM | POA: Diagnosis not present

## 2022-01-04 DIAGNOSIS — Z01812 Encounter for preprocedural laboratory examination: Secondary | ICD-10-CM | POA: Diagnosis not present

## 2022-01-04 DIAGNOSIS — R079 Chest pain, unspecified: Secondary | ICD-10-CM

## 2022-01-04 DIAGNOSIS — R0602 Shortness of breath: Secondary | ICD-10-CM | POA: Diagnosis not present

## 2022-01-04 MED ORDER — METOPROLOL TARTRATE 50 MG PO TABS: 50.0000 mg | ORAL_TABLET | ORAL | 0 refills | Status: DC
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
## 2022-01-04 NOTE — Patient Instructions (Signed)
Medication Instructions:  The current medical regimen is effective;  continue present plan and medications.  *If you need a refill on your cardiac medications before your next appointment, please call your pharmacy*   Lab Work: Please have blood work today. (BMP) If you have labs (blood work) drawn today and your tests are completely normal, you will receive your results only by: [place] (if you have MyChart) OR A paper copy in the mail If you have any lab test that is abnormal or we need to change your treatment, we will call you to review the results.   Testing/Procedures:   Your cardiac CT will be scheduled at:   [facility] [address] [place], [place] [address] [phone]  Please arrive at the [facility] and Children's Entrance (Entrance C2) of [facility] 30 minutes prior to test start time. You can use the FREE valet parking offered at entrance C (encouraged to control the heart rate for the test)  Proceed to the [facility] Radiology Department (first floor) to check-in and test prep.  All radiology patients and guests should use entrance C2 at [facility], accessed from [facility], even though the hospital's physical address listed is [address]    Please follow these instructions carefully (unless otherwise directed):  Hold all erectile dysfunction medications at least 3 days (72 hrs) prior to test.  On the Night Before the Test: Be sure to Drink plenty of water. Do not consume any caffeinated/decaffeinated beverages or chocolate 12 hours prior to your test. Do not take any antihistamines 12 hours prior to your test.  On the Day of the Test: Drink plenty of water until 1 hour prior to the test. Do not eat any food 4 hours prior to the test. You may take your regular medications prior to the test.  Take metoprolol (Lopressor) two hours prior to test. HOLD Furosemide/Hydrochlorothiazide morning of  the test. FEMALES- please wear underwire-free bra if available, avoid dresses & tight clothing  After the Test: Drink plenty of water. After receiving IV contrast, you may experience a mild flushed feeling. This is normal. On occasion, you may experience a mild rash up to 24 hours after the test. This is not dangerous. If this occurs, you can take Benadryl 25 mg and increase your fluid intake. If you experience trouble breathing, this can be serious. If it is severe call 911 IMMEDIATELY. If it is mild, please call our office. If you take any of these medications: Glipizide/Metformin, Avandament, Glucavance, please do not take 48 hours after completing test unless otherwise instructed.  We will call to schedule your test 2-4 weeks out understanding that some insurance companies will need an authorization prior to the service being performed.   For non-scheduling related questions, please contact the cardiac imaging nurse navigator should you have any questions/concerns: Marchia Bond, Cardiac Imaging Nurse Navigator Gordy Clement, Cardiac Imaging Nurse Navigator [place] [facility] Direct Office Dial: [phone]   For scheduling needs, including cancellations and rescheduling, please call [place], [phone].  Follow-Up: At [facility], you and your health needs are our priority.  As part of our continuing mission to provide you with exceptional heart care, we have created designated Provider Care Teams.  These Care Teams include your primary Cardiologist (physician) and Advanced Practice Providers (APPs -  Physician Assistants and Nurse Practitioners) who all work together to provide you with the care you need, when you need it.  We  recommend signing up for the patient portal called "MyChart".  Sign up information is provided on this After Visit Summary.  MyChart is used to connect with patients for Virtual Visits (Telemedicine).  Patients are able to view lab/test  results, encounter notes, upcoming appointments, etc.  Non-urgent messages can be sent to your provider as well.   To learn more about what you can do with MyChart, go to [email or web address].    Your next appointment:   Follow up will be based on the results of the above testing.  Important Information About Sugar

## 2022-01-05 LAB — BASIC METABOLIC PANEL
BUN/Creatinine Ratio: 24 (ref 10–24)
BUN: 29 mg/dL — ABNORMAL HIGH (ref 8–27)
CO2: 20 mmol/L (ref 20–29)
Calcium: 10.3 mg/dL — ABNORMAL HIGH (ref 8.6–10.2)
Chloride: 100 mmol/L (ref 96–106)
Creatinine, Ser: 1.2 mg/dL (ref 0.76–1.27)
Glucose: 130 mg/dL — ABNORMAL HIGH (ref 70–99)
Potassium: 4.5 mmol/L (ref 3.5–5.2)
Sodium: 139 mmol/L (ref 134–144)
eGFR: 62 mL/min/{1.73_m2} (ref >59)

## 2022-01-18 DIAGNOSIS — C44329 Squamous cell carcinoma of skin of other parts of face: Secondary | ICD-10-CM | POA: Diagnosis not present

## 2022-01-21 ENCOUNTER — Telehealth (HOSPITAL_COMMUNITY): Payer: Self-pay | Admitting: Emergency Medicine

## 2022-01-21 DIAGNOSIS — R079 Chest pain, unspecified: Secondary | ICD-10-CM

## 2022-01-21 MED ORDER — METOPROLOL TARTRATE 50 MG PO TABS: 50.0000 mg | ORAL_TABLET | ORAL | 0 refills | Status: DC

## 2022-01-21 NOTE — Telephone Encounter (Signed)
Reaching out to patient to offer assistance regarding upcoming cardiac imaging study; pt verbalizes understanding of appt date/time, parking situation and where to check in, pre-test NPO status and medications ordered, and verified current allergies; name and call back number provided for further questions should they arise Marchia Bond RN Navigator Cardiac Imaging [place] and Vascular [phone] office [phone] cell  Arrival 1100 w/c entrance

## 2022-01-24 ENCOUNTER — Ambulatory Visit (HOSPITAL_COMMUNITY)
Admission: RE | Admit: 2022-01-24 | Discharge: 2022-01-24 | Disposition: A | Discharge status: Home / Self Care | Payer: Medicare Other | Source: Ambulatory Visit | Attending: Cardiology | Admitting: Cardiology

## 2022-01-24 ENCOUNTER — Ambulatory Visit (HOSPITAL_BASED_OUTPATIENT_CLINIC_OR_DEPARTMENT_OTHER)
Admission: RE | Admit: 2022-01-24 | Discharge: 2022-01-24 | Disposition: A | Discharge status: Home / Self Care | Payer: Medicare Other | Source: Ambulatory Visit | Attending: Internal Medicine | Admitting: Internal Medicine

## 2022-01-24 ENCOUNTER — Ambulatory Visit (HOSPITAL_COMMUNITY)
Admission: RE | Admit: 2022-01-24 | Discharge: 2022-01-24 | Disposition: A | Discharge status: Home / Self Care | Payer: Medicare Other | Source: Ambulatory Visit | Attending: Internal Medicine | Admitting: Internal Medicine

## 2022-01-24 DIAGNOSIS — I700 Atherosclerosis of aorta: Secondary | ICD-10-CM | POA: Diagnosis not present

## 2022-01-24 DIAGNOSIS — I10 Essential (primary) hypertension: Secondary | ICD-10-CM | POA: Diagnosis not present

## 2022-01-24 DIAGNOSIS — I2510 Atherosclerotic heart disease of native coronary artery without angina pectoris: Secondary | ICD-10-CM | POA: Diagnosis not present

## 2022-01-24 DIAGNOSIS — R079 Chest pain, unspecified: Secondary | ICD-10-CM | POA: Diagnosis not present

## 2022-01-24 DIAGNOSIS — R931 Abnormal findings on diagnostic imaging of heart and coronary circulation: Secondary | ICD-10-CM | POA: Insufficient documentation

## 2022-01-24 DIAGNOSIS — E782 Mixed hyperlipidemia: Secondary | ICD-10-CM | POA: Diagnosis not present

## 2022-01-24 DIAGNOSIS — M170 Bilateral primary osteoarthritis of knee: Secondary | ICD-10-CM | POA: Diagnosis not present

## 2022-01-24 DIAGNOSIS — E1169 Type 2 diabetes mellitus with other specified complication: Secondary | ICD-10-CM | POA: Diagnosis not present

## 2022-01-24 MED ORDER — NITROGLYCERIN 0.4 MG SL SUBL
SUBLINGUAL_TABLET | SUBLINGUAL | Status: AC
  Filled 2022-01-24: qty 2

## 2022-01-24 MED ORDER — IOHEXOL 350 MG/ML SOLN
100.0000 mL | Freq: Once | INTRAVENOUS | Status: AC | PRN
  Administered 2022-01-24: 100 mL via INTRAVENOUS

## 2022-01-24 MED ORDER — NITROGLYCERIN 0.4 MG SL SUBL
0.8000 mg | SUBLINGUAL_TABLET | Freq: Once | SUBLINGUAL | Status: AC
  Administered 2022-01-24: 0.8 mg via SUBLINGUAL

## 2022-01-27 ENCOUNTER — Encounter: Payer: Self-pay | Admitting: Cardiology

## 2022-01-27 ENCOUNTER — Ambulatory Visit: Payer: Medicare Other | Attending: Cardiology | Admitting: Cardiology

## 2022-01-27 VITALS — BP 110/50 | HR 58 | Ht 72.0 in | Wt 186.0 lb

## 2022-01-27 DIAGNOSIS — Z01812 Encounter for preprocedural laboratory examination: Secondary | ICD-10-CM | POA: Diagnosis not present

## 2022-01-27 DIAGNOSIS — I2583 Coronary atherosclerosis due to lipid rich plaque: Secondary | ICD-10-CM | POA: Diagnosis not present

## 2022-01-27 DIAGNOSIS — I2510 Atherosclerotic heart disease of native coronary artery without angina pectoris: Secondary | ICD-10-CM | POA: Diagnosis not present

## 2022-01-27 DIAGNOSIS — I209 Angina pectoris, unspecified: Secondary | ICD-10-CM

## 2022-01-27 LAB — CBC
Hematocrit: 38.3 % (ref 37.5–51.0)
Hemoglobin: 13 g/dL (ref 13.0–17.7)
MCH: 31.3 pg (ref 26.6–33.0)
MCHC: 33.9 g/dL (ref 31.5–35.7)
MCV: 92 fL (ref 79–97)
Platelets: 138 10*3/uL — ABNORMAL LOW (ref 150–450)
RBC: 4.16 x10E6/uL (ref 4.14–5.80)
RDW: 14.3 % (ref 11.6–15.4)
WBC: 5.7 10*3/uL (ref 3.4–10.8)

## 2022-01-27 LAB — BASIC METABOLIC PANEL
BUN/Creatinine Ratio: 22 (ref 10–24)
BUN: 25 mg/dL (ref 8–27)
CO2: 29 mmol/L (ref 20–29)
Calcium: 10.3 mg/dL — ABNORMAL HIGH (ref 8.6–10.2)
Chloride: 104 mmol/L (ref 96–106)
Creatinine, Ser: 1.12 mg/dL (ref 0.76–1.27)
Glucose: 109 mg/dL — ABNORMAL HIGH (ref 70–99)
Potassium: 5.2 mmol/L (ref 3.5–5.2)
Sodium: 139 mmol/L (ref 134–144)
eGFR: 68 mL/min/{1.73_m2} (ref >59)

## 2022-01-27 MED ORDER — ASPIRIN 81 MG PO TBEC: 81.0000 mg | DELAYED_RELEASE_TABLET | Freq: Every day | ORAL | 3 refills | Status: DC

## 2022-01-27 MED ORDER — NITROGLYCERIN 0.4 MG SL SUBL: 0.4000 mg | SUBLINGUAL_TABLET | SUBLINGUAL | 3 refills | Status: AC | PRN

## 2022-01-27 MED ORDER — SODIUM CHLORIDE 0.9% FLUSH: 3.0000 mL | Freq: Two times a day (BID) | INTRAVENOUS | Status: DC

## 2022-01-27 MED ORDER — ROSUVASTATIN CALCIUM 20 MG PO TABS: 20.0000 mg | ORAL_TABLET | Freq: Every day | ORAL | 3 refills | Status: DC
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
## 2022-01-27 NOTE — Patient Instructions (Addendum)
Medication Instructions:  Please discontinue Simvastatin and start rosuvastatin (Crestor) 20 mg a day. Start Aspirin 81 mg a day. Continue all other medications as listed.  *If you need a refill on your cardiac medications before your next appointment, please call your pharmacy*  Lab Work: Please have lab work (CBC, BMP)  If you have labs (blood work) drawn today and your tests are completely normal, you will receive your results only by: [place] (if you have MyChart) OR A paper copy in the mail If you have any lab test that is abnormal or we need to change your treatment, we will call you to review the results.   Testing/Procedures:  Your physician has requested that you have an echocardiogram. Echocardiography is a painless test that uses sound waves to create images of your heart. It provides your doctor with information about the size and shape of your heart and how well your heart's chambers and valves are working. This procedure takes approximately one hour. There are no restrictions for this procedure.     [facility] [place] [facility] [place] [place] [id number] [place] [address] Dept: [phone] Loc: [phone]  NOE GOYER  01/27/2022  You are scheduled for a Cardiac Catheterization on Thursday, September 7 with Dr. Harrell Gave End.  1. Please arrive at the [facility] (Main Entrance A) at [facility]: [place], [place] [address] at 5:30 AM (two hours before your procedure to ensure your preparation). Free valet parking service is available.   Special note: Every effort is made to have your procedure done on time. Please understand that emergencies sometimes delay scheduled procedures.  2. Diet: Do not eat or drink anything after midnight prior to your procedure except sips of water to take medications.  3.  Labs:  as instructed  4. Medication instructions in preparation for your procedure:  Hold Metformin and Furosemide this AM.  Stop taking Eliquis (Apixiban) on Tuesday, September 5.     Take only 17 units of insulin the night before your procedure. Do not take any insulin on the day of the procedure.  On the morning of your procedure, take your Aspirin and any morning medicines NOT listed above.  You may use sips of water.  5. Plan for one night stay--bring personal belongings. 6. Bring a current list of your medications and current insurance cards. 7. You MUST have a responsible person to drive you home. 8. Someone MUST be with you the first 24 hours after you arrive home or your discharge will be delayed. 9. Please wear clothes that are easy to get on and off and wear slip-on shoes.  Thank you for allowing [place] to care for you!   -- [facility]  Follow-Up: At [facility], you and your health needs are our priority.  As part of our continuing mission to provide you with exceptional heart care, we have created designated Provider Care Teams.  These Care Teams include your primary Cardiologist (physician) and Advanced Practice Providers (APPs -  Physician Assistants and Nurse Practitioners) who all work together to provide you with the care you need, when you need it.  We recommend signing up for the patient portal called "MyChart".  Sign up information is provided on this After Visit Summary.  MyChart is used to connect with patients for Virtual Visits (Telemedicine).  Patients are able to view lab/test results, encounter notes, upcoming appointments, etc.  Non-urgent messages can be sent to your provider as well.   To learn more about what you can do with MyChart, go to [email or web address].    Your next appointment:   6 month(s)  The format for your next appointment:   In Person  Provider:   Dr Candee Furbish      Nitroglycerin Sublingual  Tablets What is this medication? NITROGLYCERIN (nye troe GLI ser in) prevents and treats chest pain (angina). It works by relaxing blood vessels, which decreases the amount of work the heart has to do. It belongs to a group of medications called nitrates. This medicine may be used for other purposes; ask your health care provider or pharmacist if you have questions. COMMON BRAND NAME(S): Nitroquick, Nitrostat, Nitrotab What should I tell my care team before I take this medication? They need to know if you have any of these conditions: Anemia Head injury, recent stroke, or bleeding in the brain Liver disease Previous heart attack An unusual or allergic reaction to nitroglycerin, other medications, foods, dyes, or preservatives Pregnant or trying to get pregnant Breast-feeding How should I use this medication? Take this medication by mouth as needed. Use at the first sign of an angina attack (chest pain or tightness). You can also take this medication 5 to 10 minutes before an event likely to produce chest pain. Follow the directions exactly as written on the prescription label. Place one tablet under your tongue and let it dissolve. Do not swallow whole. Replace the dose if you accidentally swallow it. It will help if your mouth is not dry. Saliva around the tablet will help it to dissolve more quickly. Do not eat or drink, smoke or chew tobacco while a tablet is dissolving. Sit down when taking this medication. In an angina attack, you should feel better within 5 minutes after your first dose. You can take a dose every 5 minutes up to a total of 3 doses. If you do not feel better or feel worse after 1 dose, call 9-1-1 at once. Do not take more than 3 doses in 15 minutes. Your care team might give you other directions. Follow those directions if they do. Do not take your medication more often than directed. Talk to your care team about the use of this medication in children. Special care may be  needed. Overdosage: If you think you have taken too much of this medicine contact a poison control center or emergency room at once. NOTE: This medicine is only for you. Do not share this medicine with others. What if I miss a dose? This does not apply. This medication is only used as needed. What may interact with this medication? Do not take this medication with any of the following: Certain migraine medications like ergotamine and dihydroergotamine (DHE) Medications used to treat erectile dysfunction like sildenafil, tadalafil, and vardenafil Riociguat This medication may also interact with the following: Alteplase Aspirin Heparin Medications for high blood pressure Medications for mental depression Other medications used to treat angina Phenothiazines like chlorpromazine, mesoridazine, prochlorperazine, thioridazine This list may not describe all possible interactions. Give your health care provider a list of all the medicines, herbs, non-prescription drugs, or dietary supplements you use. Also tell them if you smoke, drink alcohol, or use illegal drugs. Some items may interact with your medicine. What should I watch for while using this medication? Tell your care team if you feel your medication is  no longer working. Keep this medication with you at all times. Sit or lie down when you take your medication to prevent falling if you feel dizzy or faint after using it. Try to remain calm. This will help you to feel better faster. If you feel dizzy, take several deep breaths and lie down with your feet propped up, or bend forward with your head resting between your knees. You may get drowsy or dizzy. Do not drive, use machinery, or do anything that needs mental alertness until you know how this medication affects you. Do not stand or sit up quickly, especially if you are an older patient. This reduces the risk of dizzy or fainting spells. Alcohol can make you more drowsy and dizzy. Avoid  alcoholic drinks. Do not treat yourself for coughs, colds, or pain while you are taking this medication without asking your care team for advice. Some ingredients may increase your blood pressure. What side effects may I notice from receiving this medication? Side effects that you should report to your care team as soon as possible: Allergic reactions--skin rash, itching, hives, swelling of the face, lips, tongue, or throat Headache, unusual weakness or fatigue, shortness of breath, nausea, vomiting, rapid heartbeat, blue skin or lips, which may be signs of methemoglobinemia Increased pressure around the brain--severe headache, blurry vision, change in vision, nausea, vomiting Low blood pressure--dizziness, feeling faint or lightheaded, blurry vision Slow heartbeat--dizziness, feeling faint or lightheaded, confusion, trouble breathing, unusual weakness or fatigue Worsening chest pain (angina)--pain, pressure, or tightness in the chest, neck, back, or arms Side effects that usually do not require medical attention (report to your care team if they continue or are bothersome): Dizziness Flushing Headache This list may not describe all possible side effects. Call your doctor for medical advice about side effects. You may report side effects to FDA at 1-800-FDA-1088. Where should I keep my medication? Keep out of the reach of children. Store at room temperature between 20 and 25 degrees C (68 and 77 degrees F). Store in [facility]. Protect from light and moisture. Keep tightly closed. Throw away any unused medication after the expiration date. NOTE: This sheet is a summary. It may not cover all possible information. If you have questions about this medicine, talk to your doctor, pharmacist, or health care provider.  2023 Elsevier/Gold Standard (2007-07-07 00:00:00)  Important Information About Sugar

## 2022-02-01 DIAGNOSIS — Z961 Presence of intraocular lens: Secondary | ICD-10-CM | POA: Diagnosis not present

## 2022-02-01 DIAGNOSIS — H5213 Myopia, bilateral: Secondary | ICD-10-CM | POA: Diagnosis not present

## 2022-02-01 DIAGNOSIS — E119 Type 2 diabetes mellitus without complications: Secondary | ICD-10-CM | POA: Diagnosis not present

## 2022-02-02 ENCOUNTER — Telehealth: Payer: Self-pay | Admitting: *Deleted

## 2022-02-02 NOTE — Telephone Encounter (Signed)
Cardiac Catheterization scheduled at [facility] for: Thursday February 03, 2022 7:30 AM Arrival time and place: [facility] Entrance A at: 5:30 AM  Nothing to eat after midnight prior to procedure, clear liquids until 5 AM day of procedure.  Medication instructions: -Hold:  Eliquis-none 02/01/22 until post procedure  Metformin-day of procedure and 48 hours post procedure  Jardiance-AM of procedure  Insulin-1/2 usual HS dose prior to procedure -Except hold medications usual morning medications can be taken with sips of water including aspirin 81 mg.  Confirmed patient has responsible adult to drive home post procedure and be with patient first 24 hours after arriving home.  Patient reports no new symptoms concerning for COVID-19 in the past 10 days.  Reviewed procedure instructions with patient's wife (DPR).

## 2022-02-03 ENCOUNTER — Ambulatory Visit (HOSPITAL_COMMUNITY)
Admission: RE | Admit: 2022-02-03 | Discharge: 2022-02-03 | Disposition: A | Discharge status: Home / Self Care | Payer: Medicare Other | Source: Ambulatory Visit | Attending: Internal Medicine | Admitting: Internal Medicine

## 2022-02-03 ENCOUNTER — Other Ambulatory Visit: Payer: Self-pay

## 2022-02-03 ENCOUNTER — Encounter (HOSPITAL_COMMUNITY)
Admission: RE | Disposition: A | Discharge status: Home / Self Care | Payer: Self-pay | Source: Ambulatory Visit | Attending: Internal Medicine

## 2022-02-03 DIAGNOSIS — Z01812 Encounter for preprocedural laboratory examination: Secondary | ICD-10-CM

## 2022-02-03 DIAGNOSIS — I25118 Atherosclerotic heart disease of native coronary artery with other forms of angina pectoris: Secondary | ICD-10-CM

## 2022-02-03 DIAGNOSIS — E785 Hyperlipidemia, unspecified: Secondary | ICD-10-CM | POA: Diagnosis not present

## 2022-02-03 DIAGNOSIS — Z794 Long term (current) use of insulin: Secondary | ICD-10-CM | POA: Insufficient documentation

## 2022-02-03 DIAGNOSIS — I2584 Coronary atherosclerosis due to calcified coronary lesion: Secondary | ICD-10-CM | POA: Insufficient documentation

## 2022-02-03 DIAGNOSIS — R931 Abnormal findings on diagnostic imaging of heart and coronary circulation: Secondary | ICD-10-CM

## 2022-02-03 DIAGNOSIS — I2510 Atherosclerotic heart disease of native coronary artery without angina pectoris: Secondary | ICD-10-CM

## 2022-02-03 DIAGNOSIS — I209 Angina pectoris, unspecified: Secondary | ICD-10-CM

## 2022-02-03 DIAGNOSIS — I10 Essential (primary) hypertension: Secondary | ICD-10-CM | POA: Insufficient documentation

## 2022-02-03 DIAGNOSIS — E119 Type 2 diabetes mellitus without complications: Secondary | ICD-10-CM | POA: Diagnosis not present

## 2022-02-03 DIAGNOSIS — Z7984 Long term (current) use of oral hypoglycemic drugs: Secondary | ICD-10-CM | POA: Insufficient documentation

## 2022-02-03 DIAGNOSIS — I2582 Chronic total occlusion of coronary artery: Secondary | ICD-10-CM | POA: Diagnosis not present

## 2022-02-03 DIAGNOSIS — Z86711 Personal history of pulmonary embolism: Secondary | ICD-10-CM | POA: Diagnosis not present

## 2022-02-03 DIAGNOSIS — Z87891 Personal history of nicotine dependence: Secondary | ICD-10-CM | POA: Diagnosis not present

## 2022-02-03 DIAGNOSIS — Z7901 Long term (current) use of anticoagulants: Secondary | ICD-10-CM | POA: Diagnosis not present

## 2022-02-03 HISTORY — PX: LEFT HEART CATH AND CORONARY ANGIOGRAPHY: CATH118249

## 2022-02-03 LAB — GLUCOSE, CAPILLARY: Glucose-Capillary: 145 mg/dL — ABNORMAL HIGH (ref 70–99)

## 2022-02-03 SURGERY — LEFT HEART CATH AND CORONARY ANGIOGRAPHY
Anesthesia: LOCAL

## 2022-02-03 MED ORDER — LIDOCAINE HCL (PF) 1 % IJ SOLN
INTRAMUSCULAR | Status: DC | PRN
  Administered 2022-02-03: 2 mL

## 2022-02-03 MED ORDER — MIDAZOLAM HCL 2 MG/2ML IJ SOLN
INTRAMUSCULAR | Status: AC
  Filled 2022-02-03: qty 2

## 2022-02-03 MED ORDER — SODIUM CHLORIDE 0.9% FLUSH: 3.0000 mL | Freq: Two times a day (BID) | INTRAVENOUS | Status: DC

## 2022-02-03 MED ORDER — LIDOCAINE HCL (PF) 1 % IJ SOLN
INTRAMUSCULAR | Status: AC
Start: 2022-02-03 — End: ?
  Filled 2022-02-03: qty 30

## 2022-02-03 MED ORDER — SODIUM CHLORIDE 0.9% FLUSH: 3.0000 mL | INTRAVENOUS | Status: DC | PRN

## 2022-02-03 MED ORDER — HEPARIN SODIUM (PORCINE) 1000 UNIT/ML IJ SOLN
INTRAMUSCULAR | Status: DC | PRN
  Administered 2022-02-03: 4000 [IU] via INTRAVENOUS

## 2022-02-03 MED ORDER — FENTANYL CITRATE (PF) 100 MCG/2ML IJ SOLN
INTRAMUSCULAR | Status: DC | PRN
  Administered 2022-02-03: 25 ug via INTRAVENOUS

## 2022-02-03 MED ORDER — HYDRALAZINE HCL 20 MG/ML IJ SOLN: 10.0000 mg | INTRAMUSCULAR | Status: DC | PRN

## 2022-02-03 MED ORDER — VERAPAMIL HCL 2.5 MG/ML IV SOLN
INTRAVENOUS | Status: DC | PRN
  Administered 2022-02-03: 10 mL via INTRA_ARTERIAL

## 2022-02-03 MED ORDER — METFORMIN HCL ER 500 MG PO TB24: 500.0000 mg | ORAL_TABLET | Freq: Two times a day (BID) | ORAL | 6 refills | Status: AC

## 2022-02-03 MED ORDER — SODIUM CHLORIDE 0.9 % IV SOLN: INTRAVENOUS | Status: DC

## 2022-02-03 MED ORDER — HEPARIN SODIUM (PORCINE) 1000 UNIT/ML IJ SOLN
INTRAMUSCULAR | Status: AC
  Filled 2022-02-03: qty 10

## 2022-02-03 MED ORDER — SODIUM CHLORIDE 0.9 % WEIGHT BASED INFUSION
3.0000 mL/kg/h | INTRAVENOUS | Status: AC
  Administered 2022-02-03: 3 mL/kg/h via INTRAVENOUS

## 2022-02-03 MED ORDER — MIDAZOLAM HCL 2 MG/2ML IJ SOLN
INTRAMUSCULAR | Status: DC | PRN
  Administered 2022-02-03: 1 mg via INTRAVENOUS

## 2022-02-03 MED ORDER — ASPIRIN 81 MG PO CHEW: 81.0000 mg | CHEWABLE_TABLET | ORAL | Status: DC

## 2022-02-03 MED ORDER — HEPARIN (PORCINE) IN NACL 1000-0.9 UT/500ML-% IV SOLN
INTRAVENOUS | Status: AC
Start: 2022-02-03 — End: ?
  Filled 2022-02-03: qty 500

## 2022-02-03 MED ORDER — IOHEXOL 350 MG/ML SOLN
INTRAVENOUS | Status: DC | PRN
  Administered 2022-02-03: 50 mL

## 2022-02-03 MED ORDER — SODIUM CHLORIDE 0.9 % WEIGHT BASED INFUSION: 1.0000 mL/kg/h | INTRAVENOUS | Status: DC

## 2022-02-03 MED ORDER — SODIUM CHLORIDE 0.9 % IV SOLN: 250.0000 mL | INTRAVENOUS | Status: DC | PRN

## 2022-02-03 MED ORDER — HEPARIN (PORCINE) IN NACL 1000-0.9 UT/500ML-% IV SOLN
INTRAVENOUS | Status: DC | PRN
  Administered 2022-02-03 (×2): 500 mL

## 2022-02-03 MED ORDER — ISOSORBIDE MONONITRATE ER 30 MG PO TB24: 30.0000 mg | ORAL_TABLET | Freq: Every day | ORAL | 11 refills | Status: DC

## 2022-02-03 MED ORDER — VERAPAMIL HCL 2.5 MG/ML IV SOLN
INTRAVENOUS | Status: AC
  Filled 2022-02-03: qty 2

## 2022-02-03 MED ORDER — LABETALOL HCL 5 MG/ML IV SOLN: 10.0000 mg | INTRAVENOUS | Status: DC | PRN

## 2022-02-03 MED ORDER — FENTANYL CITRATE (PF) 100 MCG/2ML IJ SOLN
INTRAMUSCULAR | Status: AC
  Filled 2022-02-03: qty 2

## 2022-02-03 MED ORDER — ONDANSETRON HCL 4 MG/2ML IJ SOLN: 4.0000 mg | Freq: Four times a day (QID) | INTRAMUSCULAR | Status: DC | PRN

## 2022-02-03 MED ORDER — ACETAMINOPHEN 325 MG PO TABS: 650.0000 mg | ORAL_TABLET | ORAL | Status: DC | PRN

## 2022-02-03 SURGICAL SUPPLY — 9 items
BAND ZEPHYR COMPRESS 30 LONG (HEMOSTASIS) IMPLANT
CATH 5FR JL3.5 JR4 ANG PIG MP (CATHETERS) IMPLANT
GLIDESHEATH SLEND SS 6F .021 (SHEATH) IMPLANT
GUIDEWIRE INQWIRE 1.5J.035X260 (WIRE) IMPLANT
INQWIRE 1.5J .035X260CM (WIRE) ×1
KIT HEART LEFT (KITS) ×1 IMPLANT
PACK CARDIAC CATHETERIZATION (CUSTOM PROCEDURE TRAY) ×1 IMPLANT
TRANSDUCER W/STOPCOCK (MISCELLANEOUS) ×1 IMPLANT
TUBING CIL FLEX 10 FLL-RA (TUBING) ×1 IMPLANT

## 2022-02-03 NOTE — Interval H&P Note (Signed)
History and Physical Interval Note:  02/03/2022 7:09 AM  [name]  has presented today for surgery, with the diagnosis of stable angina and abnormal coronary CTA.  The various methods of treatment have been discussed with the patient and family. After consideration of risks, benefits and other options for treatment, the patient has consented to  Procedure(s): LEFT HEART CATH AND CORONARY ANGIOGRAPHY (N/A) as a surgical intervention.  The patient's history has been reviewed, patient examined, no change in status, stable for surgery.  I have reviewed the patient's chart and labs.  Questions were answered to the patient's satisfaction.    Cath Lab Visit (complete for each Cath Lab visit)  Clinical Evaluation Leading to the Procedure:   ACS: No.  Non-ACS:    Anginal Classification: CCS IV  Anti-ischemic medical therapy: No Therapy  Non-Invasive Test Results: Severe coronary artery calcification and 3-vessel coronary artery disease by cardiac CTA -> high risk  Prior CABG: No previous CABG  [name]

## 2022-02-03 NOTE — Brief Op Note (Signed)
BRIEF CARDIAC CATHETERIZATION NOTE  02/03/2022  8:34 AM  PATIENT:  [name]  76 y.o. male  PRE-OPERATIVE DIAGNOSIS:  CAD  POST-OPERATIVE DIAGNOSIS:  * No post-op diagnosis entered *  PROCEDURE:  Procedure(s): LEFT HEART CATH AND CORONARY ANGIOGRAPHY (N/A)  SURGEON:  Surgeon(s) and Role:    * Tanice Petre, Harrell Gave, MD - Primary  FINDINGS: Severe 3-vessel CAD. Mildly-moderately reduced LVEF.  RECOMMENDATIONS: Cardiac surgery consultation. Proceed with echocardiogram, as scheduled. Start isosorbide mononitrate 30 mg daily. Aggressive secondary prevention of CAD.  [name], MD [facility]

## 2022-02-04 ENCOUNTER — Encounter (HOSPITAL_COMMUNITY): Payer: Self-pay | Admitting: Internal Medicine

## 2022-02-07 ENCOUNTER — Ambulatory Visit (HOSPITAL_COMMUNITY): Payer: Medicare Other | Attending: Cardiology

## 2022-02-07 DIAGNOSIS — I2583 Coronary atherosclerosis due to lipid rich plaque: Secondary | ICD-10-CM | POA: Insufficient documentation

## 2022-02-07 DIAGNOSIS — C678 Malignant neoplasm of overlapping sites of bladder: Secondary | ICD-10-CM | POA: Diagnosis not present

## 2022-02-07 DIAGNOSIS — I2510 Atherosclerotic heart disease of native coronary artery without angina pectoris: Secondary | ICD-10-CM | POA: Insufficient documentation

## 2022-02-07 DIAGNOSIS — I340 Nonrheumatic mitral (valve) insufficiency: Secondary | ICD-10-CM

## 2022-02-07 LAB — ECHOCARDIOGRAM COMPLETE
Area-P 1/2: 2.85 cm2
P 1/2 time: 542 msec
S' Lateral: 3.8 cm

## 2022-02-08 ENCOUNTER — Telehealth: Payer: Self-pay | Admitting: Cardiology

## 2022-02-08 MED ORDER — ROSUVASTATIN CALCIUM 20 MG PO TABS: 20.0000 mg | ORAL_TABLET | Freq: Every day | ORAL | 3 refills | Status: DC

## 2022-02-08 NOTE — Telephone Encounter (Signed)
Spoke with the patient's wife who states that the patient's rosuvastatin was sent to the wrong pharmacy. She would like for it to be sent to [facility]. I have sent this to the correct pharmacy.

## 2022-02-08 NOTE — Telephone Encounter (Signed)
Patient's wife called worried about husband's medication
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
## 2022-02-11 ENCOUNTER — Institutional Professional Consult (permissible substitution): Payer: Medicare Other | Admitting: Thoracic Surgery (Cardiothoracic Vascular Surgery)

## 2022-02-11 ENCOUNTER — Other Ambulatory Visit: Payer: Self-pay | Admitting: *Deleted

## 2022-02-11 ENCOUNTER — Other Ambulatory Visit: Payer: Self-pay

## 2022-02-11 VITALS — BP 134/70 | HR 59 | Resp 18 | Ht 72.0 in | Wt 185.0 lb

## 2022-02-11 DIAGNOSIS — I2510 Atherosclerotic heart disease of native coronary artery without angina pectoris: Secondary | ICD-10-CM

## 2022-02-11 DIAGNOSIS — I340 Nonrheumatic mitral (valve) insufficiency: Secondary | ICD-10-CM

## 2022-02-11 NOTE — Pre-Procedure Instructions (Signed)
Surgical Instructions    Your procedure is scheduled on Wednesday, September 20th.  Report to [facility] Main Entrance "A" at 06:30 A.M., then check in with the Admitting office.  Call this number if you have problems the morning of surgery:  [phone]   If you have any questions prior to your surgery date call [phone]: Open Monday-Friday 8am-4pm    Remember:  Do not eat or drink after midnight the night before your surgery     Take these medicines the morning of surgery with A SIP OF WATER  isosorbide mononitrate (IMDUR)  rosuvastatin (CRESTOR)  If needed: nitroGLYCERIN (NITROSTAT)   Hold Eliquis 2-3 days prior to surgery. Do Not take Aspirin day of surgery.  As of today, STOP taking any Aleve, Naproxen, Ibuprofen, Motrin, Advil, Goody's, BC's, all herbal medications, fish oil, and all vitamins.  WHAT DO I DO ABOUT MY DIABETES MEDICATION?   Hold JARDIANCE 3 days prior to surgery. Last dose 9/16.  THE NIGHT BEFORE SURGERY, take 17.5 units (50%) of Insulin Glargine (BASAGLAR KWIKPEN) . Do not take morning of surgery.      Do not take metFORMIN (GLUCOPHAGE-XR) day of surgery.    HOW TO MANAGE YOUR DIABETES BEFORE AND AFTER SURGERY  Why is it important to control my blood sugar before and after surgery? Improving blood sugar levels before and after surgery helps healing and can limit problems. A way of improving blood sugar control is eating a healthy diet by:  Eating less sugar and carbohydrates  Increasing activity/exercise  Talking with your doctor about reaching your blood sugar goals High blood sugars (greater than 180 mg/dL) can raise your risk of infections and slow your recovery, so you will need to focus on controlling your diabetes during the weeks before surgery. Make sure that the doctor who takes care of your diabetes knows about your planned surgery including the date and location.  How do I manage my blood sugar before surgery? Check your blood  sugar at least 4 times a day, starting 2 days before surgery, to make sure that the level is not too high or low.  Check your blood sugar the morning of your surgery when you wake up and every 2 hours until you get to the Short Stay unit.  If your blood sugar is less than 70 mg/dL, you will need to treat for low blood sugar: Do not take insulin. Treat a low blood sugar (less than 70 mg/dL) with  cup of clear juice (cranberry or apple), 4 glucose tablets, OR glucose gel. Recheck blood sugar in 15 minutes after treatment (to make sure it is greater than 70 mg/dL). If your blood sugar is not greater than 70 mg/dL on recheck, call [phone] for further instructions. Report your blood sugar to the short stay nurse when you get to Short Stay.  If you are admitted to the hospital after surgery: Your blood sugar will be checked by the staff and you will probably be given insulin after surgery (instead of oral diabetes medicines) to make sure you have good blood sugar levels. The goal for blood sugar control after surgery is 80-180 mg/dL.                    Do NOT Smoke (Tobacco/Vaping) for 24 hours prior to your procedure.  If you use a CPAP at night, you may bring your mask/headgear for your overnight stay.   Contacts, glasses, piercing's, hearing aid's, dentures or partials may not be worn  into surgery, please bring cases for these belongings.    For patients admitted to the hospital, discharge time will be determined by your treatment team.   Patients discharged the day of surgery will not be allowed to drive home, and someone needs to stay with them for 24 hours.  SURGICAL WAITING ROOM VISITATION Patients having surgery or a procedure may have no more than 2 support people in the waiting area - these visitors may rotate.   Children under the age of 58 must have an adult with them who is not the patient. If the patient needs to stay at the hospital during part of their recovery, the visitor  guidelines for inpatient rooms apply. Pre-op nurse will coordinate an appropriate time for 1 support person to accompany patient in pre-op.  This support person may not rotate.   Please refer to the [facility] website for the visitor guidelines for Inpatients (after your surgery is over and you are in a regular room).    Special instructions:   [place]- Preparing For Surgery  Before surgery, you can play an important role. Because skin is not sterile, your skin needs to be as free of germs as possible. You can reduce the number of germs on your skin by washing with CHG (chlorahexidine gluconate) Soap before surgery.  CHG is an antiseptic cleaner which kills germs and bonds with the skin to continue killing germs even after washing.    Oral Hygiene is also important to reduce your risk of infection.  Remember - BRUSH YOUR TEETH THE MORNING OF SURGERY WITH YOUR REGULAR TOOTHPASTE  Please do not use if you have an allergy to CHG or antibacterial soaps. If your skin becomes reddened/irritated stop using the CHG.  Do not shave (including legs and underarms) for at least 48 hours prior to first CHG shower. It is OK to shave your face.  Please follow these instructions carefully.   Shower the NIGHT BEFORE SURGERY and the MORNING OF SURGERY  If you chose to wash your hair, wash your hair first as usual with your normal shampoo.  After you shampoo, rinse your hair and body thoroughly to remove the shampoo.  Use CHG Soap as you would any other liquid soap. You can apply CHG directly to the skin and wash gently with a scrungie or a clean washcloth.   Apply the CHG Soap to your body ONLY FROM THE NECK DOWN.  Do not use on open wounds or open sores. Avoid contact with your eyes, ears, mouth and genitals (private parts). Wash Face and genitals (private parts)  with your normal soap.   Wash thoroughly, paying special attention to the area where your surgery will be performed.  Thoroughly rinse  your body with warm water from the neck down.  DO NOT shower/wash with your normal soap after using and rinsing off the CHG Soap.  Pat yourself dry with a CLEAN TOWEL.  Wear CLEAN PAJAMAS to bed the night before surgery  Place CLEAN SHEETS on your bed the night before your surgery  DO NOT SLEEP WITH PETS.   Day of Surgery: Take a shower with CHG soap. Do not wear jewelry or makeup Do not wear lotions, powders, colognes, or deodorant. Men may shave face and neck. Do not bring valuables to the hospital. [facility] is not responsible for any belongings or valuables.  Wear Clean/Comfortable clothing the morning of surgery Remember to brush your teeth WITH YOUR REGULAR TOOTHPASTE.   Please read over the  following fact sheets that you were given.    If you received a COVID test during your pre-op visit  it is requested that you wear a mask when out in public, stay away from anyone that may not be feeling well and notify your surgeon if you develop symptoms. If you have been in contact with anyone that has tested positive in the last 10 days please notify you surgeon.

## 2022-02-14 ENCOUNTER — Encounter (HOSPITAL_COMMUNITY)
Admission: RE | Admit: 2022-02-14 | Discharge: 2022-02-14 | Disposition: A | Discharge status: Home / Self Care | Payer: Medicare Other | Source: Ambulatory Visit | Attending: Thoracic Surgery (Cardiothoracic Vascular Surgery) | Admitting: Thoracic Surgery (Cardiothoracic Vascular Surgery)

## 2022-02-14 ENCOUNTER — Ambulatory Visit (HOSPITAL_COMMUNITY)
Admission: RE | Admit: 2022-02-14 | Discharge: 2022-02-14 | Disposition: A | Discharge status: Home / Self Care | Payer: Medicare Other | Source: Ambulatory Visit | Attending: Thoracic Surgery (Cardiothoracic Vascular Surgery) | Admitting: Thoracic Surgery (Cardiothoracic Vascular Surgery)

## 2022-02-14 ENCOUNTER — Other Ambulatory Visit: Payer: Self-pay

## 2022-02-14 ENCOUNTER — Encounter (HOSPITAL_COMMUNITY): Payer: Self-pay

## 2022-02-14 ENCOUNTER — Ambulatory Visit (HOSPITAL_BASED_OUTPATIENT_CLINIC_OR_DEPARTMENT_OTHER)
Admission: RE | Admit: 2022-02-14 | Discharge: 2022-02-14 | Disposition: A | Discharge status: Home / Self Care | Payer: Medicare Other | Source: Ambulatory Visit | Attending: Thoracic Surgery (Cardiothoracic Vascular Surgery) | Admitting: Thoracic Surgery (Cardiothoracic Vascular Surgery)

## 2022-02-14 VITALS — BP 131/65 | HR 58 | Temp 97.7°F | Resp 17 | Ht 72.0 in | Wt 183.0 lb

## 2022-02-14 DIAGNOSIS — Z8551 Personal history of malignant neoplasm of bladder: Secondary | ICD-10-CM | POA: Diagnosis not present

## 2022-02-14 DIAGNOSIS — M170 Bilateral primary osteoarthritis of knee: Secondary | ICD-10-CM | POA: Diagnosis not present

## 2022-02-14 DIAGNOSIS — R9431 Abnormal electrocardiogram [ECG] [EKG]: Secondary | ICD-10-CM | POA: Insufficient documentation

## 2022-02-14 DIAGNOSIS — E785 Hyperlipidemia, unspecified: Secondary | ICD-10-CM | POA: Insufficient documentation

## 2022-02-14 DIAGNOSIS — I2510 Atherosclerotic heart disease of native coronary artery without angina pectoris: Secondary | ICD-10-CM

## 2022-02-14 DIAGNOSIS — Z20822 Contact with and (suspected) exposure to covid-19: Secondary | ICD-10-CM | POA: Insufficient documentation

## 2022-02-14 DIAGNOSIS — Z01818 Encounter for other preprocedural examination: Secondary | ICD-10-CM | POA: Insufficient documentation

## 2022-02-14 DIAGNOSIS — Z87891 Personal history of nicotine dependence: Secondary | ICD-10-CM | POA: Diagnosis not present

## 2022-02-14 DIAGNOSIS — Z7982 Long term (current) use of aspirin: Secondary | ICD-10-CM | POA: Diagnosis not present

## 2022-02-14 DIAGNOSIS — Z825 Family history of asthma and other chronic lower respiratory diseases: Secondary | ICD-10-CM | POA: Diagnosis not present

## 2022-02-14 DIAGNOSIS — Z794 Long term (current) use of insulin: Secondary | ICD-10-CM | POA: Diagnosis not present

## 2022-02-14 DIAGNOSIS — Z87820 Personal history of traumatic brain injury: Secondary | ICD-10-CM | POA: Insufficient documentation

## 2022-02-14 DIAGNOSIS — I10 Essential (primary) hypertension: Secondary | ICD-10-CM | POA: Insufficient documentation

## 2022-02-14 DIAGNOSIS — I493 Ventricular premature depolarization: Secondary | ICD-10-CM | POA: Diagnosis not present

## 2022-02-14 DIAGNOSIS — J439 Emphysema, unspecified: Secondary | ICD-10-CM | POA: Diagnosis not present

## 2022-02-14 DIAGNOSIS — D62 Acute posthemorrhagic anemia: Secondary | ICD-10-CM | POA: Diagnosis not present

## 2022-02-14 DIAGNOSIS — E119 Type 2 diabetes mellitus without complications: Secondary | ICD-10-CM | POA: Insufficient documentation

## 2022-02-14 DIAGNOSIS — Z7984 Long term (current) use of oral hypoglycemic drugs: Secondary | ICD-10-CM | POA: Diagnosis not present

## 2022-02-14 DIAGNOSIS — I340 Nonrheumatic mitral (valve) insufficiency: Secondary | ICD-10-CM | POA: Diagnosis not present

## 2022-02-14 DIAGNOSIS — I25118 Atherosclerotic heart disease of native coronary artery with other forms of angina pectoris: Secondary | ICD-10-CM | POA: Diagnosis not present

## 2022-02-14 DIAGNOSIS — E7800 Pure hypercholesterolemia, unspecified: Secondary | ICD-10-CM | POA: Diagnosis not present

## 2022-02-14 DIAGNOSIS — I77810 Thoracic aortic ectasia: Secondary | ICD-10-CM | POA: Diagnosis not present

## 2022-02-14 DIAGNOSIS — E871 Hypo-osmolality and hyponatremia: Secondary | ICD-10-CM | POA: Diagnosis not present

## 2022-02-14 DIAGNOSIS — Z7901 Long term (current) use of anticoagulants: Secondary | ICD-10-CM | POA: Diagnosis not present

## 2022-02-14 DIAGNOSIS — Z880 Allergy status to penicillin: Secondary | ICD-10-CM | POA: Diagnosis not present

## 2022-02-14 DIAGNOSIS — Z86711 Personal history of pulmonary embolism: Secondary | ICD-10-CM | POA: Diagnosis not present

## 2022-02-14 DIAGNOSIS — T502X5A Adverse effect of carbonic-anhydrase inhibitors, benzothiadiazides and other diuretics, initial encounter: Secondary | ICD-10-CM | POA: Diagnosis not present

## 2022-02-14 DIAGNOSIS — Z95828 Presence of other vascular implants and grafts: Secondary | ICD-10-CM | POA: Diagnosis not present

## 2022-02-14 DIAGNOSIS — K219 Gastro-esophageal reflux disease without esophagitis: Secondary | ICD-10-CM | POA: Diagnosis not present

## 2022-02-14 DIAGNOSIS — Q250 Patent ductus arteriosus: Secondary | ICD-10-CM | POA: Diagnosis not present

## 2022-02-14 DIAGNOSIS — Z86718 Personal history of other venous thrombosis and embolism: Secondary | ICD-10-CM | POA: Diagnosis not present

## 2022-02-14 LAB — COMPREHENSIVE METABOLIC PANEL
ALT: 20 U/L (ref 0–44)
AST: 19 U/L (ref 15–41)
Albumin: 4 g/dL (ref 3.5–5.0)
Alkaline Phosphatase: 43 U/L (ref 38–126)
Anion gap: 11 (ref 5–15)
BUN: 21 mg/dL (ref 8–23)
CO2: 20 mmol/L — ABNORMAL LOW (ref 22–32)
Calcium: 9.7 mg/dL (ref 8.9–10.3)
Chloride: 106 mmol/L (ref 98–111)
Creatinine, Ser: 1.13 mg/dL (ref 0.61–1.24)
GFR, Estimated: >60 mL/min (ref >60)
Glucose, Bld: 222 mg/dL — ABNORMAL HIGH (ref 70–99)
Potassium: 4.7 mmol/L (ref 3.5–5.1)
Sodium: 137 mmol/L (ref 135–145)
Total Bilirubin: 1.2 mg/dL (ref 0.3–1.2)
Total Protein: 7.5 g/dL (ref 6.5–8.1)

## 2022-02-14 LAB — URINALYSIS, ROUTINE W REFLEX MICROSCOPIC
Bilirubin Urine: NEGATIVE
Glucose, UA: >=500 mg/dL — AB
Ketones, ur: NEGATIVE mg/dL
Leukocytes,Ua: NEGATIVE
Nitrite: NEGATIVE
Protein, ur: NEGATIVE mg/dL
Specific Gravity, Urine: 1.024 (ref 1.005–1.030)
pH: 5 (ref 5.0–8.0)

## 2022-02-14 LAB — CBC
HCT: 40.7 % (ref 39.0–52.0)
Hemoglobin: 13.7 g/dL (ref 13.0–17.0)
MCH: 32 pg (ref 26.0–34.0)
MCHC: 33.7 g/dL (ref 30.0–36.0)
MCV: 95.1 fL (ref 80.0–100.0)
Platelets: 147 10*3/uL — ABNORMAL LOW (ref 150–400)
RBC: 4.28 MIL/uL (ref 4.22–5.81)
RDW: 14.3 % (ref 11.5–15.5)
WBC: 6.5 10*3/uL (ref 4.0–10.5)
nRBC: 0 % (ref 0.0–0.2)

## 2022-02-14 LAB — TYPE AND SCREEN
ABO/RH(D): A POS
Antibody Screen: NEGATIVE

## 2022-02-14 LAB — PROTIME-INR
INR: 1 (ref 0.8–1.2)
Prothrombin Time: 12.9 seconds (ref 11.4–15.2)

## 2022-02-14 LAB — BLOOD GAS, ARTERIAL
Acid-base deficit: 0.1 mmol/L (ref 0.0–2.0)
Bicarbonate: 23.9 mmol/L (ref 20.0–28.0)
O2 Saturation: 99.1 %
Patient temperature: 37
pCO2 arterial: 36 mmHg (ref 32–48)
pH, Arterial: 7.43 (ref 7.35–7.45)
pO2, Arterial: 91 mmHg (ref 83–108)

## 2022-02-14 LAB — GLUCOSE, CAPILLARY: Glucose-Capillary: 221 mg/dL — ABNORMAL HIGH (ref 70–99)

## 2022-02-14 LAB — APTT: aPTT: 26 seconds (ref 24–36)

## 2022-02-14 LAB — HEMOGLOBIN A1C
Hgb A1c MFr Bld: 6.9 % — ABNORMAL HIGH (ref 4.8–5.6)
Mean Plasma Glucose: 151.33 mg/dL

## 2022-02-14 LAB — SURGICAL PCR SCREEN
MRSA, PCR: NEGATIVE
Staphylococcus aureus: NEGATIVE

## 2022-02-14 LAB — SARS CORONAVIRUS 2 (TAT 6-24 HRS): SARS Coronavirus 2: NEGATIVE

## 2022-02-14 NOTE — Progress Notes (Signed)
Pre cabg has been completed.   Preliminary results in CV Proc.   [name] 02/14/2022 10:17 AM

## 2022-02-14 NOTE — Pre-Procedure Instructions (Signed)
Surgical Instructions    Your procedure is scheduled on Wednesday, September 20th.  Report to [facility] Main Entrance "A" at 06:30 A.M., then check in with the Admitting office.  Call this number if you have problems the morning of surgery:  [phone]   If you have any questions prior to your surgery date call [phone]: Open Monday-Friday 8am-4pm    Remember:  Do not eat or drink after midnight the night before your surgery     Take these medicines the morning of surgery with A SIP OF WATER  isosorbide mononitrate (IMDUR)  rosuvastatin (CRESTOR)  If needed: nitroGLYCERIN (NITROSTAT)   Follow your surgeon's instructions on when to stop Eliquis.  If no instructions were given by your surgeon then you will need to call the office to get those instructions.   Hold Eliquis 2-3 days prior to surgery.   Follow your surgeon's instructions on when to stop Aspirin.  If no instructions were given by your surgeon then you will need to call the office to get those instructions.     As of today, STOP taking any Aleve, Naproxen, Ibuprofen, Motrin, Advil, Goody's, BC's, all herbal medications, fish oil, and all vitamins.  WHAT DO I DO ABOUT MY DIABETES MEDICATION?   Hold JARDIANCE 3 days prior to surgery. Last dose 9/16.  THE NIGHT BEFORE SURGERY, take 17 units (50%) of Insulin Glargine (BASAGLAR KWIKPEN) . Do not take morning of surgery.      Do not take metFORMIN (GLUCOPHAGE-XR) day of surgery.    HOW TO MANAGE YOUR DIABETES BEFORE AND AFTER SURGERY  Why is it important to control my blood sugar before and after surgery? Improving blood sugar levels before and after surgery helps healing and can limit problems. A way of improving blood sugar control is eating a healthy diet by:  Eating less sugar and carbohydrates  Increasing activity/exercise  Talking with your doctor about reaching your blood sugar goals High blood sugars (greater than 180 mg/dL) can raise your risk of  infections and slow your recovery, so you will need to focus on controlling your diabetes during the weeks before surgery. Make sure that the doctor who takes care of your diabetes knows about your planned surgery including the date and location.  How do I manage my blood sugar before surgery? Check your blood sugar at least 4 times a day, starting 2 days before surgery, to make sure that the level is not too high or low.  Check your blood sugar the morning of your surgery when you wake up and every 2 hours until you get to the Short Stay unit.  If your blood sugar is less than 70 mg/dL, you will need to treat for low blood sugar: Do not take insulin. Treat a low blood sugar (less than 70 mg/dL) with  cup of clear juice (cranberry or apple), 4 glucose tablets, OR glucose gel. Recheck blood sugar in 15 minutes after treatment (to make sure it is greater than 70 mg/dL). If your blood sugar is not greater than 70 mg/dL on recheck, call [phone] for further instructions. Report your blood sugar to the short stay nurse when you get to Short Stay.  If you are admitted to the hospital after surgery: Your blood sugar will be checked by the staff and you will probably be given insulin after surgery (instead of oral diabetes medicines) to make sure you have good blood sugar levels. The goal for blood sugar control after surgery is 80-180 mg/dL.  Do NOT Smoke (Tobacco/Vaping) for 24 hours prior to your procedure.  If you use a CPAP at night, you may bring your mask/headgear for your overnight stay.   Contacts, glasses, piercing's, hearing aid's, dentures or partials may not be worn into surgery, please bring cases for these belongings.    For patients admitted to the hospital, discharge time will be determined by your treatment team.   Patients discharged the day of surgery will not be allowed to drive home, and someone needs to stay with them for 24 hours.  SURGICAL WAITING  ROOM VISITATION Patients having surgery or a procedure may have no more than 2 support people in the waiting area - these visitors may rotate.   Children under the age of 68 must have an adult with them who is not the patient. If the patient needs to stay at the hospital during part of their recovery, the visitor guidelines for inpatient rooms apply. Pre-op nurse will coordinate an appropriate time for 1 support person to accompany patient in pre-op.  This support person may not rotate.   Please refer to the [facility] website for the visitor guidelines for Inpatients (after your surgery is over and you are in a regular room).    Special instructions:   [place]- Preparing For Surgery  Before surgery, you can play an important role. Because skin is not sterile, your skin needs to be as free of germs as possible. You can reduce the number of germs on your skin by washing with CHG (chlorahexidine gluconate) Soap before surgery.  CHG is an antiseptic cleaner which kills germs and bonds with the skin to continue killing germs even after washing.    Oral Hygiene is also important to reduce your risk of infection.  Remember - BRUSH YOUR TEETH THE MORNING OF SURGERY WITH YOUR REGULAR TOOTHPASTE  Please do not use if you have an allergy to CHG or antibacterial soaps. If your skin becomes reddened/irritated stop using the CHG.  Do not shave (including legs and underarms) for at least 48 hours prior to first CHG shower. It is OK to shave your face.  Please follow these instructions carefully.   Shower the NIGHT BEFORE SURGERY and the MORNING OF SURGERY  If you chose to wash your hair, wash your hair first as usual with your normal shampoo.  After you shampoo, rinse your hair and body thoroughly to remove the shampoo.  Use CHG Soap as you would any other liquid soap. You can apply CHG directly to the skin and wash gently with a scrungie or a clean washcloth.   Apply the CHG Soap to your body  ONLY FROM THE NECK DOWN.  Do not use on open wounds or open sores. Avoid contact with your eyes, ears, mouth and genitals (private parts). Wash Face and genitals (private parts)  with your normal soap.   Wash thoroughly, paying special attention to the area where your surgery will be performed.  Thoroughly rinse your body with warm water from the neck down.  DO NOT shower/wash with your normal soap after using and rinsing off the CHG Soap.  Pat yourself dry with a CLEAN TOWEL.  Wear CLEAN PAJAMAS to bed the night before surgery  Place CLEAN SHEETS on your bed the night before your surgery  DO NOT SLEEP WITH PETS.   Day of Surgery: Take a shower with CHG soap. Do not wear jewelry or makeup Do not wear lotions, powders, colognes, or deodorant. Men may shave face  and neck. Do not bring valuables to the hospital. [facility] is not responsible for any belongings or valuables.  Wear Clean/Comfortable clothing the morning of surgery Remember to brush your teeth WITH YOUR REGULAR TOOTHPASTE.   Please read over the following fact sheets that you were given.    If you received a COVID test during your pre-op visit  it is requested that you wear a mask when out in public, stay away from anyone that may not be feeling well and notify your surgeon if you develop symptoms. If you have been in contact with anyone that has tested positive in the last 10 days please notify you surgeon.

## 2022-02-14 NOTE — Progress Notes (Signed)
PCP - [name] Cardiologist - Candee Furbish  PPM/ICD - denies Chest x-ray - 02/14/2022  EKG - 02/14/2022  Stress Test - denies ECHO - 02/07/22 Cardiac Cath - 02/03/22  Sleep Study - denies   Fasting Blood Sugar - Hgb A1c 02/14/2022  Checks blood sugar 3x a day at home. Normally 100 fasting per patient. 221 at PAT appointment. Pt states he ate sausage and eggs this morning at 7AM. Pt reports he was told to stom Metformin last dose 02/13/22, but he did take Jardiance this morning. Pt instructed to hold Jardiance starting now.  Blood Thinner Instructions: Per Dr Kipp Brood- pt needs to hold 2-3 days. Pt reports last dose on 02/12/22.  Aspirin Instructions: Follow your surgeon's instructions on when to stop Aspirin.  If no instructions were given by your surgeon then you will need to call the office to get those instructions.     ERAS Protcol - NPO order   COVID TEST- 02/14/2022 - pt denied symptoms.   Pt reported that he did get dental clearance and this was sent to Dr. Abran Duke office.    Anesthesia review: heart hx- CABG patient.   Patient denies shortness of breath, fever, cough and chest pain at PAT appointment   All instructions explained to the patient, with a verbal understanding of the material. Patient agrees to go over the instructions while at home for a better understanding. Patient also instructed to self quarantine after being tested for COVID-19. The opportunity to ask questions was provided.

## 2022-02-15 MED ORDER — HEPARIN 30,000 UNITS/1000 ML (OHS) CELLSAVER SOLUTION
Status: DC
  Filled 2022-02-15 (×2): qty 1000

## 2022-02-15 MED ORDER — PHENYLEPHRINE HCL-NACL 20-0.9 MG/250ML-% IV SOLN
30.0000 ug/min | INTRAVENOUS | Status: AC
  Administered 2022-02-16: 20 ug/min via INTRAVENOUS
  Filled 2022-02-15 (×2): qty 250

## 2022-02-15 MED ORDER — CEFAZOLIN SODIUM-DEXTROSE 2-4 GM/100ML-% IV SOLN
2.0000 g | INTRAVENOUS | Status: DC
  Filled 2022-02-15: qty 100

## 2022-02-15 MED ORDER — TRANEXAMIC ACID 1000 MG/10ML IV SOLN
1.5000 mg/kg/h | INTRAVENOUS | Status: AC
  Administered 2022-02-16: 1.5 mg/kg/h via INTRAVENOUS
  Filled 2022-02-15 (×2): qty 25

## 2022-02-15 MED ORDER — POTASSIUM CHLORIDE 2 MEQ/ML IV SOLN
80.0000 meq | INTRAVENOUS | Status: DC
  Filled 2022-02-15 (×2): qty 40

## 2022-02-15 MED ORDER — EPINEPHRINE HCL 5 MG/250ML IV SOLN IN NS
0.0000 ug/min | INTRAVENOUS | Status: DC
  Filled 2022-02-15 (×2): qty 250

## 2022-02-15 MED ORDER — DEXMEDETOMIDINE HCL IN NACL 400 MCG/100ML IV SOLN
0.1000 ug/kg/h | INTRAVENOUS | Status: AC
  Administered 2022-02-16: .4 ug/kg/h via INTRAVENOUS
  Filled 2022-02-15 (×2): qty 100

## 2022-02-15 MED ORDER — TRANEXAMIC ACID (OHS) BOLUS VIA INFUSION
15.0000 mg/kg | INTRAVENOUS | Status: AC
  Administered 2022-02-16: 1245 mg via INTRAVENOUS
  Filled 2022-02-15: qty 1245

## 2022-02-15 MED ORDER — VANCOMYCIN HCL 1250 MG/250ML IV SOLN
1250.0000 mg | INTRAVENOUS | Status: AC
  Administered 2022-02-16: 1250 mg via INTRAVENOUS
  Filled 2022-02-15 (×2): qty 250

## 2022-02-15 MED ORDER — NOREPINEPHRINE 4 MG/250ML-% IV SOLN
0.0000 ug/min | INTRAVENOUS | Status: DC
  Filled 2022-02-15 (×2): qty 250

## 2022-02-15 MED ORDER — MILRINONE LACTATE IN DEXTROSE 20-5 MG/100ML-% IV SOLN
0.3000 ug/kg/min | INTRAVENOUS | Status: DC
  Filled 2022-02-15 (×2): qty 100

## 2022-02-15 MED ORDER — MANNITOL 20 % IV SOLN
INTRAVENOUS | Status: DC
  Filled 2022-02-15 (×2): qty 13

## 2022-02-15 MED ORDER — INSULIN REGULAR(HUMAN) IN NACL 100-0.9 UT/100ML-% IV SOLN
INTRAVENOUS | Status: AC
  Administered 2022-02-16: 3.2 [IU]/h via INTRAVENOUS
  Filled 2022-02-15: qty 100

## 2022-02-15 MED ORDER — NITROGLYCERIN IN D5W 200-5 MCG/ML-% IV SOLN
2.0000 ug/min | INTRAVENOUS | Status: AC
  Administered 2022-02-16: 15 ug/min via INTRAVENOUS
  Filled 2022-02-15 (×2): qty 250

## 2022-02-15 MED ORDER — PLASMA-LYTE A IV SOLN
INTRAVENOUS | Status: DC
  Filled 2022-02-15 (×2): qty 2.5

## 2022-02-15 MED ORDER — CEFAZOLIN SODIUM-DEXTROSE 2-4 GM/100ML-% IV SOLN
2.0000 g | INTRAVENOUS | Status: AC
  Administered 2022-02-16 (×2): 2 g via INTRAVENOUS
  Filled 2022-02-15: qty 100

## 2022-02-15 MED ORDER — TRANEXAMIC ACID (OHS) PUMP PRIME SOLUTION
2.0000 mg/kg | INTRAVENOUS | Status: DC
  Filled 2022-02-15 (×2): qty 1.66

## 2022-02-15 NOTE — Anesthesia Preprocedure Evaluation (Signed)
Anesthesia Evaluation  Patient identified by MRN, date of birth, ID band Patient awake    Reviewed: Allergy & Precautions, NPO status , Patient's Chart, lab work & pertinent test results  Airway Mallampati: II       Dental   Pulmonary former smoker,    breath sounds clear to auscultation       Cardiovascular hypertension, + angina + CAD  + Valvular Problems/Murmurs MR  Rhythm:Regular Rate:Normal     Neuro/Psych    GI/Hepatic Neg liver ROS, GERD  ,  Endo/Other  diabetes  Renal/GU      Musculoskeletal  (+) Arthritis ,   Abdominal   Peds  Hematology   Anesthesia Other Findings   Reproductive/Obstetrics                            Anesthesia Physical Anesthesia Plan  ASA: 3  Anesthesia Plan: General   Post-op Pain Management:    Induction: Intravenous  PONV Risk Score and Plan: Ondansetron, Dexamethasone and Midazolam  Airway Management Planned: Oral ETT  Additional Equipment: Arterial line, CVP, TEE and Ultrasound Guidance Line Placement  Intra-op Plan:   Post-operative Plan: Post-operative intubation/ventilation  Informed Consent: I have reviewed the patients History and Physical, chart, labs and discussed the procedure including the risks, benefits and alternatives for the proposed anesthesia with the patient or authorized representative who has indicated his/her understanding and acceptance.     Dental advisory given  Plan Discussed with: Anesthesiologist  Anesthesia Plan Comments:        Anesthesia Quick Evaluation

## 2022-02-16 ENCOUNTER — Other Ambulatory Visit: Payer: Self-pay

## 2022-02-16 ENCOUNTER — Encounter (HOSPITAL_COMMUNITY): Payer: Self-pay | Admitting: Thoracic Surgery (Cardiothoracic Vascular Surgery)

## 2022-02-16 ENCOUNTER — Inpatient Hospital Stay (HOSPITAL_COMMUNITY): Payer: Medicare Other

## 2022-02-16 ENCOUNTER — Inpatient Hospital Stay (HOSPITAL_COMMUNITY)
Admission: RE | Disposition: A | Discharge status: Home / Self Care | Payer: Self-pay | Source: Home / Self Care | Attending: Thoracic Surgery (Cardiothoracic Vascular Surgery)

## 2022-02-16 ENCOUNTER — Inpatient Hospital Stay (HOSPITAL_COMMUNITY): Payer: Medicare Other | Admitting: Physician Assistant

## 2022-02-16 ENCOUNTER — Inpatient Hospital Stay (HOSPITAL_COMMUNITY)
Admission: RE | Admit: 2022-02-16 | Discharge: 2022-02-21 | DRG: 236 | Disposition: A | Discharge status: Home / Self Care | Payer: Medicare Other | Attending: Thoracic Surgery (Cardiothoracic Vascular Surgery) | Admitting: Thoracic Surgery (Cardiothoracic Vascular Surgery)

## 2022-02-16 ENCOUNTER — Inpatient Hospital Stay (HOSPITAL_COMMUNITY): Payer: Medicare Other | Admitting: Certified Registered Nurse Anesthetist

## 2022-02-16 DIAGNOSIS — I77810 Thoracic aortic ectasia: Secondary | ICD-10-CM | POA: Diagnosis present

## 2022-02-16 DIAGNOSIS — Z87891 Personal history of nicotine dependence: Secondary | ICD-10-CM | POA: Diagnosis not present

## 2022-02-16 DIAGNOSIS — I10 Essential (primary) hypertension: Secondary | ICD-10-CM | POA: Diagnosis not present

## 2022-02-16 DIAGNOSIS — Z7901 Long term (current) use of anticoagulants: Secondary | ICD-10-CM

## 2022-02-16 DIAGNOSIS — I340 Nonrheumatic mitral (valve) insufficiency: Secondary | ICD-10-CM | POA: Diagnosis not present

## 2022-02-16 DIAGNOSIS — Z951 Presence of aortocoronary bypass graft: Principal | ICD-10-CM

## 2022-02-16 DIAGNOSIS — I2510 Atherosclerotic heart disease of native coronary artery without angina pectoris: Secondary | ICD-10-CM

## 2022-02-16 DIAGNOSIS — E7800 Pure hypercholesterolemia, unspecified: Secondary | ICD-10-CM | POA: Diagnosis present

## 2022-02-16 DIAGNOSIS — Z880 Allergy status to penicillin: Secondary | ICD-10-CM

## 2022-02-16 DIAGNOSIS — E785 Hyperlipidemia, unspecified: Secondary | ICD-10-CM | POA: Diagnosis not present

## 2022-02-16 DIAGNOSIS — Z86711 Personal history of pulmonary embolism: Secondary | ICD-10-CM

## 2022-02-16 DIAGNOSIS — E119 Type 2 diabetes mellitus without complications: Secondary | ICD-10-CM | POA: Diagnosis present

## 2022-02-16 DIAGNOSIS — Z7984 Long term (current) use of oral hypoglycemic drugs: Secondary | ICD-10-CM | POA: Diagnosis not present

## 2022-02-16 DIAGNOSIS — Z794 Long term (current) use of insulin: Secondary | ICD-10-CM | POA: Diagnosis not present

## 2022-02-16 DIAGNOSIS — Z95828 Presence of other vascular implants and grafts: Secondary | ICD-10-CM | POA: Diagnosis not present

## 2022-02-16 DIAGNOSIS — I25118 Atherosclerotic heart disease of native coronary artery with other forms of angina pectoris: Principal | ICD-10-CM | POA: Diagnosis present

## 2022-02-16 DIAGNOSIS — R931 Abnormal findings on diagnostic imaging of heart and coronary circulation: Secondary | ICD-10-CM | POA: Diagnosis not present

## 2022-02-16 DIAGNOSIS — Z8551 Personal history of malignant neoplasm of bladder: Secondary | ICD-10-CM

## 2022-02-16 DIAGNOSIS — Q250 Patent ductus arteriosus: Secondary | ICD-10-CM | POA: Diagnosis not present

## 2022-02-16 DIAGNOSIS — Z86718 Personal history of other venous thrombosis and embolism: Secondary | ICD-10-CM | POA: Diagnosis not present

## 2022-02-16 DIAGNOSIS — Z825 Family history of asthma and other chronic lower respiratory diseases: Secondary | ICD-10-CM

## 2022-02-16 DIAGNOSIS — Z7982 Long term (current) use of aspirin: Secondary | ICD-10-CM | POA: Diagnosis not present

## 2022-02-16 DIAGNOSIS — E871 Hypo-osmolality and hyponatremia: Secondary | ICD-10-CM | POA: Diagnosis not present

## 2022-02-16 DIAGNOSIS — I493 Ventricular premature depolarization: Secondary | ICD-10-CM | POA: Diagnosis present

## 2022-02-16 DIAGNOSIS — I25119 Atherosclerotic heart disease of native coronary artery with unspecified angina pectoris: Secondary | ICD-10-CM | POA: Diagnosis not present

## 2022-02-16 DIAGNOSIS — T502X5A Adverse effect of carbonic-anhydrase inhibitors, benzothiadiazides and other diuretics, initial encounter: Secondary | ICD-10-CM | POA: Diagnosis not present

## 2022-02-16 DIAGNOSIS — M170 Bilateral primary osteoarthritis of knee: Secondary | ICD-10-CM | POA: Diagnosis present

## 2022-02-16 DIAGNOSIS — D62 Acute posthemorrhagic anemia: Secondary | ICD-10-CM | POA: Diagnosis not present

## 2022-02-16 DIAGNOSIS — Z79899 Other long term (current) drug therapy: Secondary | ICD-10-CM

## 2022-02-16 DIAGNOSIS — R29898 Other symptoms and signs involving the musculoskeletal system: Secondary | ICD-10-CM | POA: Diagnosis not present

## 2022-02-16 DIAGNOSIS — K219 Gastro-esophageal reflux disease without esophagitis: Secondary | ICD-10-CM | POA: Diagnosis present

## 2022-02-16 DIAGNOSIS — J90 Pleural effusion, not elsewhere classified: Secondary | ICD-10-CM | POA: Diagnosis not present

## 2022-02-16 DIAGNOSIS — I080 Rheumatic disorders of both mitral and aortic valves: Secondary | ICD-10-CM | POA: Diagnosis not present

## 2022-02-16 DIAGNOSIS — J9811 Atelectasis: Secondary | ICD-10-CM | POA: Diagnosis not present

## 2022-02-16 HISTORY — PX: TEE WITHOUT CARDIOVERSION: SHX5443

## 2022-02-16 HISTORY — PX: CORONARY ARTERY BYPASS GRAFT: SHX141

## 2022-02-16 LAB — POCT I-STAT, CHEM 8
BUN: 18 mg/dL (ref 8–23)
BUN: 17 mg/dL (ref 8–23)
BUN: 16 mg/dL (ref 8–23)
BUN: 17 mg/dL (ref 8–23)
BUN: 16 mg/dL (ref 8–23)
Calcium, Ion: 1.31 mmol/L (ref 1.15–1.40)
Calcium, Ion: 1.28 mmol/L (ref 1.15–1.40)
Calcium, Ion: 1.12 mmol/L — ABNORMAL LOW (ref 1.15–1.40)
Calcium, Ion: 1.14 mmol/L — ABNORMAL LOW (ref 1.15–1.40)
Calcium, Ion: 1.39 mmol/L (ref 1.15–1.40)
Chloride: 106 mmol/L (ref 98–111)
Chloride: 108 mmol/L (ref 98–111)
Chloride: 106 mmol/L (ref 98–111)
Chloride: 105 mmol/L (ref 98–111)
Chloride: 106 mmol/L (ref 98–111)
Creatinine, Ser: 0.9 mg/dL (ref 0.61–1.24)
Creatinine, Ser: 0.8 mg/dL (ref 0.61–1.24)
Creatinine, Ser: 0.7 mg/dL (ref 0.61–1.24)
Creatinine, Ser: 0.8 mg/dL (ref 0.61–1.24)
Creatinine, Ser: 0.7 mg/dL (ref 0.61–1.24)
Glucose, Bld: 132 mg/dL — ABNORMAL HIGH (ref 70–99)
Glucose, Bld: 103 mg/dL — ABNORMAL HIGH (ref 70–99)
Glucose, Bld: 91 mg/dL (ref 70–99)
Glucose, Bld: 115 mg/dL — ABNORMAL HIGH (ref 70–99)
Glucose, Bld: 134 mg/dL — ABNORMAL HIGH (ref 70–99)
HCT: 33 % — ABNORMAL LOW (ref 39.0–52.0)
HCT: 32 % — ABNORMAL LOW (ref 39.0–52.0)
HCT: 26 % — ABNORMAL LOW (ref 39.0–52.0)
HCT: 26 % — ABNORMAL LOW (ref 39.0–52.0)
HCT: 29 % — ABNORMAL LOW (ref 39.0–52.0)
Hemoglobin: 11.2 g/dL — ABNORMAL LOW (ref 13.0–17.0)
Hemoglobin: 10.9 g/dL — ABNORMAL LOW (ref 13.0–17.0)
Hemoglobin: 8.8 g/dL — ABNORMAL LOW (ref 13.0–17.0)
Hemoglobin: 8.8 g/dL — ABNORMAL LOW (ref 13.0–17.0)
Hemoglobin: 9.9 g/dL — ABNORMAL LOW (ref 13.0–17.0)
Potassium: 5.5 mmol/L — ABNORMAL HIGH (ref 3.5–5.1)
Potassium: 4.4 mmol/L (ref 3.5–5.1)
Potassium: 5.3 mmol/L — ABNORMAL HIGH (ref 3.5–5.1)
Potassium: 5.2 mmol/L — ABNORMAL HIGH (ref 3.5–5.1)
Potassium: 4.3 mmol/L (ref 3.5–5.1)
Sodium: 138 mmol/L (ref 135–145)
Sodium: 140 mmol/L (ref 135–145)
Sodium: 137 mmol/L (ref 135–145)
Sodium: 138 mmol/L (ref 135–145)
Sodium: 138 mmol/L (ref 135–145)
TCO2: 30 mmol/L (ref 22–32)
TCO2: 24 mmol/L (ref 22–32)
TCO2: 24 mmol/L (ref 22–32)
TCO2: 23 mmol/L (ref 22–32)
TCO2: 23 mmol/L (ref 22–32)

## 2022-02-16 LAB — POCT I-STAT 7, (LYTES, BLD GAS, ICA,H+H)
Acid-Base Excess: 0 mmol/L (ref 0.0–2.0)
Acid-Base Excess: 0 mmol/L (ref 0.0–2.0)
Acid-base deficit: 1 mmol/L (ref 0.0–2.0)
Acid-base deficit: 1 mmol/L (ref 0.0–2.0)
Acid-base deficit: 1 mmol/L (ref 0.0–2.0)
Acid-base deficit: 6 mmol/L — ABNORMAL HIGH (ref 0.0–2.0)
Acid-base deficit: 7 mmol/L — ABNORMAL HIGH (ref 0.0–2.0)
Acid-base deficit: 7 mmol/L — ABNORMAL HIGH (ref 0.0–2.0)
Bicarbonate: 25 mmol/L (ref 20.0–28.0)
Bicarbonate: 23.5 mmol/L (ref 20.0–28.0)
Bicarbonate: 24.1 mmol/L (ref 20.0–28.0)
Bicarbonate: 23.2 mmol/L (ref 20.0–28.0)
Bicarbonate: 23.8 mmol/L (ref 20.0–28.0)
Bicarbonate: 19.3 mmol/L — ABNORMAL LOW (ref 20.0–28.0)
Bicarbonate: 18.2 mmol/L — ABNORMAL LOW (ref 20.0–28.0)
Bicarbonate: 18.4 mmol/L — ABNORMAL LOW (ref 20.0–28.0)
Calcium, Ion: 1.08 mmol/L — ABNORMAL LOW (ref 1.15–1.40)
Calcium, Ion: 1.11 mmol/L — ABNORMAL LOW (ref 1.15–1.40)
Calcium, Ion: 1.13 mmol/L — ABNORMAL LOW (ref 1.15–1.40)
Calcium, Ion: 1.13 mmol/L — ABNORMAL LOW (ref 1.15–1.40)
Calcium, Ion: 1.39 mmol/L (ref 1.15–1.40)
Calcium, Ion: 1.29 mmol/L (ref 1.15–1.40)
Calcium, Ion: 1.19 mmol/L (ref 1.15–1.40)
Calcium, Ion: 1.24 mmol/L (ref 1.15–1.40)
HCT: 26 % — ABNORMAL LOW (ref 39.0–52.0)
HCT: 25 % — ABNORMAL LOW (ref 39.0–52.0)
HCT: 26 % — ABNORMAL LOW (ref 39.0–52.0)
HCT: 26 % — ABNORMAL LOW (ref 39.0–52.0)
HCT: 26 % — ABNORMAL LOW (ref 39.0–52.0)
HCT: 26 % — ABNORMAL LOW (ref 39.0–52.0)
HCT: 25 % — ABNORMAL LOW (ref 39.0–52.0)
HCT: 28 % — ABNORMAL LOW (ref 39.0–52.0)
Hemoglobin: 8.8 g/dL — ABNORMAL LOW (ref 13.0–17.0)
Hemoglobin: 8.5 g/dL — ABNORMAL LOW (ref 13.0–17.0)
Hemoglobin: 8.8 g/dL — ABNORMAL LOW (ref 13.0–17.0)
Hemoglobin: 8.8 g/dL — ABNORMAL LOW (ref 13.0–17.0)
Hemoglobin: 8.8 g/dL — ABNORMAL LOW (ref 13.0–17.0)
Hemoglobin: 8.8 g/dL — ABNORMAL LOW (ref 13.0–17.0)
Hemoglobin: 8.5 g/dL — ABNORMAL LOW (ref 13.0–17.0)
Hemoglobin: 9.5 g/dL — ABNORMAL LOW (ref 13.0–17.0)
O2 Saturation: 100 %
O2 Saturation: 100 %
O2 Saturation: 100 %
O2 Saturation: 100 %
O2 Saturation: 100 %
O2 Saturation: 96 %
O2 Saturation: 97 %
O2 Saturation: 96 %
Patient temperature: 36.8
Patient temperature: 97.9
Potassium: 4.4 mmol/L (ref 3.5–5.1)
Potassium: 5 mmol/L (ref 3.5–5.1)
Potassium: 5.1 mmol/L (ref 3.5–5.1)
Potassium: 5.2 mmol/L — ABNORMAL HIGH (ref 3.5–5.1)
Potassium: 4.4 mmol/L (ref 3.5–5.1)
Potassium: 3.7 mmol/L (ref 3.5–5.1)
Potassium: 4.7 mmol/L (ref 3.5–5.1)
Potassium: 5.5 mmol/L — ABNORMAL HIGH (ref 3.5–5.1)
Sodium: 139 mmol/L (ref 135–145)
Sodium: 137 mmol/L (ref 135–145)
Sodium: 137 mmol/L (ref 135–145)
Sodium: 137 mmol/L (ref 135–145)
Sodium: 137 mmol/L (ref 135–145)
Sodium: 141 mmol/L (ref 135–145)
Sodium: 140 mmol/L (ref 135–145)
Sodium: 138 mmol/L (ref 135–145)
TCO2: 26 mmol/L (ref 22–32)
TCO2: 24 mmol/L (ref 22–32)
TCO2: 25 mmol/L (ref 22–32)
TCO2: 24 mmol/L (ref 22–32)
TCO2: 25 mmol/L (ref 22–32)
TCO2: 20 mmol/L — ABNORMAL LOW (ref 22–32)
TCO2: 19 mmol/L — ABNORMAL LOW (ref 22–32)
TCO2: 19 mmol/L — ABNORMAL LOW (ref 22–32)
pCO2 arterial: 45.1 mmHg (ref 32–48)
pCO2 arterial: 31.4 mmHg — ABNORMAL LOW (ref 32–48)
pCO2 arterial: 35 mmHg (ref 32–48)
pCO2 arterial: 34.9 mmHg (ref 32–48)
pCO2 arterial: 39.2 mmHg (ref 32–48)
pCO2 arterial: 36.2 mmHg (ref 32–48)
pCO2 arterial: 34.2 mmHg (ref 32–48)
pCO2 arterial: 32.9 mmHg (ref 32–48)
pH, Arterial: 7.352 (ref 7.35–7.45)
pH, Arterial: 7.446 (ref 7.35–7.45)
pH, Arterial: 7.482 — ABNORMAL HIGH (ref 7.35–7.45)
pH, Arterial: 7.432 (ref 7.35–7.45)
pH, Arterial: 7.392 (ref 7.35–7.45)
pH, Arterial: 7.336 — ABNORMAL LOW (ref 7.35–7.45)
pH, Arterial: 7.333 — ABNORMAL LOW (ref 7.35–7.45)
pH, Arterial: 7.353 (ref 7.35–7.45)
pO2, Arterial: 404 mmHg — ABNORMAL HIGH (ref 83–108)
pO2, Arterial: 330 mmHg — ABNORMAL HIGH (ref 83–108)
pO2, Arterial: 347 mmHg — ABNORMAL HIGH (ref 83–108)
pO2, Arterial: 206 mmHg — ABNORMAL HIGH (ref 83–108)
pO2, Arterial: 314 mmHg — ABNORMAL HIGH (ref 83–108)
pO2, Arterial: 87 mmHg (ref 83–108)
pO2, Arterial: 91 mmHg (ref 83–108)
pO2, Arterial: 83 mmHg (ref 83–108)

## 2022-02-16 LAB — PLATELET COUNT: Platelets: 110 10*3/uL — ABNORMAL LOW (ref 150–400)

## 2022-02-16 LAB — HEMOGLOBIN AND HEMATOCRIT, BLOOD
HCT: 25.6 % — ABNORMAL LOW (ref 39.0–52.0)
Hemoglobin: 8.8 g/dL — ABNORMAL LOW (ref 13.0–17.0)

## 2022-02-16 LAB — ABO/RH: ABO/RH(D): A POS

## 2022-02-16 LAB — CBC
HCT: 29.1 % — ABNORMAL LOW (ref 39.0–52.0)
HCT: 30 % — ABNORMAL LOW (ref 39.0–52.0)
Hemoglobin: 10 g/dL — ABNORMAL LOW (ref 13.0–17.0)
Hemoglobin: 10.3 g/dL — ABNORMAL LOW (ref 13.0–17.0)
MCH: 32.1 pg (ref 26.0–34.0)
MCH: 32.3 pg (ref 26.0–34.0)
MCHC: 34.4 g/dL (ref 30.0–36.0)
MCHC: 34.3 g/dL (ref 30.0–36.0)
MCV: 93.3 fL (ref 80.0–100.0)
MCV: 94 fL (ref 80.0–100.0)
Platelets: 94 10*3/uL — ABNORMAL LOW (ref 150–400)
Platelets: 104 10*3/uL — ABNORMAL LOW (ref 150–400)
RBC: 3.12 MIL/uL — ABNORMAL LOW (ref 4.22–5.81)
RBC: 3.19 MIL/uL — ABNORMAL LOW (ref 4.22–5.81)
RDW: 14.2 % (ref 11.5–15.5)
RDW: 14.1 % (ref 11.5–15.5)
WBC: 7.8 10*3/uL (ref 4.0–10.5)
WBC: 8 10*3/uL (ref 4.0–10.5)
nRBC: 0 % (ref 0.0–0.2)
nRBC: 0 % (ref 0.0–0.2)

## 2022-02-16 LAB — POCT I-STAT EG7
Acid-base deficit: 1 mmol/L (ref 0.0–2.0)
Bicarbonate: 24.7 mmol/L (ref 20.0–28.0)
Calcium, Ion: 1.14 mmol/L — ABNORMAL LOW (ref 1.15–1.40)
HCT: 26 % — ABNORMAL LOW (ref 39.0–52.0)
Hemoglobin: 8.8 g/dL — ABNORMAL LOW (ref 13.0–17.0)
O2 Saturation: 83 %
Potassium: 4.4 mmol/L (ref 3.5–5.1)
Sodium: 140 mmol/L (ref 135–145)
TCO2: 26 mmol/L (ref 22–32)
pCO2, Ven: 48.5 mmHg (ref 44–60)
pH, Ven: 7.316 (ref 7.25–7.43)
pO2, Ven: 53 mmHg — ABNORMAL HIGH (ref 32–45)

## 2022-02-16 LAB — GLUCOSE, CAPILLARY
Glucose-Capillary: 166 mg/dL — ABNORMAL HIGH (ref 70–99)
Glucose-Capillary: 78 mg/dL (ref 70–99)
Glucose-Capillary: 81 mg/dL (ref 70–99)
Glucose-Capillary: 93 mg/dL (ref 70–99)
Glucose-Capillary: 85 mg/dL (ref 70–99)
Glucose-Capillary: 108 mg/dL — ABNORMAL HIGH (ref 70–99)
Glucose-Capillary: 85 mg/dL (ref 70–99)
Glucose-Capillary: 113 mg/dL — ABNORMAL HIGH (ref 70–99)
Glucose-Capillary: 119 mg/dL — ABNORMAL HIGH (ref 70–99)
Glucose-Capillary: 95 mg/dL (ref 70–99)
Glucose-Capillary: 70 mg/dL (ref 70–99)

## 2022-02-16 LAB — PROTIME-INR
INR: 1.4 — ABNORMAL HIGH (ref 0.8–1.2)
Prothrombin Time: 17.1 s — ABNORMAL HIGH (ref 11.4–15.2)

## 2022-02-16 LAB — APTT: aPTT: 31 s (ref 24–36)

## 2022-02-16 LAB — BASIC METABOLIC PANEL
Anion gap: 10 (ref 5–15)
BUN: 16 mg/dL (ref 8–23)
CO2: 17 mmol/L — ABNORMAL LOW (ref 22–32)
Calcium: 8.3 mg/dL — ABNORMAL LOW (ref 8.9–10.3)
Chloride: 111 mmol/L (ref 98–111)
Creatinine, Ser: 0.96 mg/dL (ref 0.61–1.24)
GFR, Estimated: >60 mL/min (ref >60)
Glucose, Bld: 109 mg/dL — ABNORMAL HIGH (ref 70–99)
Potassium: 5.5 mmol/L — ABNORMAL HIGH (ref 3.5–5.1)
Sodium: 138 mmol/L (ref 135–145)

## 2022-02-16 LAB — MAGNESIUM: Magnesium: 3.2 mg/dL — ABNORMAL HIGH (ref 1.7–2.4)

## 2022-02-16 SURGERY — CORONARY ARTERY BYPASS GRAFTING (CABG)
Anesthesia: General | Site: Chest

## 2022-02-16 MED ORDER — FENTANYL CITRATE (PF) 250 MCG/5ML IJ SOLN
INTRAMUSCULAR | Status: DC | PRN
  Administered 2022-02-16 (×2): 50 ug via INTRAVENOUS
  Administered 2022-02-16: 100 ug via INTRAVENOUS
  Administered 2022-02-16 (×4): 50 ug via INTRAVENOUS
  Administered 2022-02-16: 100 ug via INTRAVENOUS
  Administered 2022-02-16 (×2): 50 ug via INTRAVENOUS
  Administered 2022-02-16: 200 ug via INTRAVENOUS
  Administered 2022-02-16 (×5): 50 ug via INTRAVENOUS

## 2022-02-16 MED ORDER — LACTATED RINGERS IV SOLN: INTRAVENOUS | Status: DC

## 2022-02-16 MED ORDER — METOPROLOL TARTRATE 25 MG/10 ML ORAL SUSPENSION: 12.5000 mg | Freq: Two times a day (BID) | ORAL | Status: DC

## 2022-02-16 MED ORDER — METOPROLOL TARTRATE 12.5 MG HALF TABLET
12.5000 mg | ORAL_TABLET | Freq: Two times a day (BID) | ORAL | Status: DC
  Administered 2022-02-16 – 2022-02-17 (×3): 12.5 mg via ORAL
  Filled 2022-02-16 (×3): qty 1

## 2022-02-16 MED ORDER — FENTANYL CITRATE (PF) 250 MCG/5ML IJ SOLN
INTRAMUSCULAR | Status: AC
  Filled 2022-02-16: qty 5

## 2022-02-16 MED ORDER — NOREPINEPHRINE 4 MG/250ML-% IV SOLN: 0.0000 ug/min | INTRAVENOUS | Status: DC

## 2022-02-16 MED ORDER — ASPIRIN 325 MG PO TBEC: 325.0000 mg | DELAYED_RELEASE_TABLET | Freq: Every day | ORAL | Status: DC

## 2022-02-16 MED ORDER — MORPHINE SULFATE (PF) 2 MG/ML IV SOLN
1.0000 mg | INTRAVENOUS | Status: DC | PRN
  Administered 2022-02-16: 2 mg via INTRAVENOUS
  Filled 2022-02-16: qty 1

## 2022-02-16 MED ORDER — PROTAMINE SULFATE 10 MG/ML IV SOLN
INTRAVENOUS | Status: DC | PRN
  Administered 2022-02-16: 250 mg via INTRAVENOUS

## 2022-02-16 MED ORDER — SODIUM CHLORIDE 0.45 % IV SOLN: INTRAVENOUS | Status: DC | PRN

## 2022-02-16 MED ORDER — ROCURONIUM BROMIDE 10 MG/ML (PF) SYRINGE
PREFILLED_SYRINGE | INTRAVENOUS | Status: DC | PRN
  Administered 2022-02-16: 50 mg via INTRAVENOUS
  Administered 2022-02-16: 100 mg via INTRAVENOUS
  Administered 2022-02-16: 20 mg via INTRAVENOUS
  Administered 2022-02-16: 30 mg via INTRAVENOUS

## 2022-02-16 MED ORDER — LACTATED RINGERS IV SOLN: INTRAVENOUS | Status: DC | PRN

## 2022-02-16 MED ORDER — CHLORHEXIDINE GLUCONATE 0.12 % MT SOLN
15.0000 mL | Freq: Once | OROMUCOSAL | Status: AC
  Administered 2022-02-16: 15 mL via OROMUCOSAL
  Filled 2022-02-16: qty 15

## 2022-02-16 MED ORDER — LIDOCAINE 2% (20 MG/ML) 5 ML SYRINGE
INTRAMUSCULAR | Status: DC | PRN
  Administered 2022-02-16: 100 mg via INTRAVENOUS

## 2022-02-16 MED ORDER — PLASMA-LYTE A IV SOLN: INTRAVENOUS | Status: DC | PRN

## 2022-02-16 MED ORDER — CEFAZOLIN SODIUM-DEXTROSE 2-4 GM/100ML-% IV SOLN
2.0000 g | Freq: Three times a day (TID) | INTRAVENOUS | Status: AC
  Administered 2022-02-16 – 2022-02-18 (×6): 2 g via INTRAVENOUS
  Filled 2022-02-16 (×6): qty 100

## 2022-02-16 MED ORDER — SODIUM CHLORIDE 0.9% FLUSH: 3.0000 mL | INTRAVENOUS | Status: DC | PRN

## 2022-02-16 MED ORDER — NICARDIPINE HCL IN NACL 20-0.86 MG/200ML-% IV SOLN
0.0000 mg/h | INTRAVENOUS | Status: DC
  Administered 2022-02-16: 5 mg/h via INTRAVENOUS
  Filled 2022-02-16: qty 200

## 2022-02-16 MED ORDER — ASPIRIN 81 MG PO CHEW: 324.0000 mg | CHEWABLE_TABLET | Freq: Every day | ORAL | Status: DC

## 2022-02-16 MED ORDER — SODIUM CHLORIDE 0.9 % IV SOLN: 250.0000 mL | INTRAVENOUS | Status: DC

## 2022-02-16 MED ORDER — HEPARIN SODIUM (PORCINE) 1000 UNIT/ML IJ SOLN
INTRAMUSCULAR | Status: DC | PRN
  Administered 2022-02-16: 29000 [IU] via INTRAVENOUS

## 2022-02-16 MED ORDER — ACETAMINOPHEN 160 MG/5ML PO SOLN: 650.0000 mg | Freq: Once | ORAL | Status: AC

## 2022-02-16 MED ORDER — INSULIN REGULAR(HUMAN) IN NACL 100-0.9 UT/100ML-% IV SOLN: INTRAVENOUS | Status: DC

## 2022-02-16 MED ORDER — 0.9 % SODIUM CHLORIDE (POUR BTL) OPTIME
TOPICAL | Status: DC | PRN
  Administered 2022-02-16: 3000 mL

## 2022-02-16 MED ORDER — SODIUM CHLORIDE 0.9% FLUSH
3.0000 mL | Freq: Two times a day (BID) | INTRAVENOUS | Status: DC
  Administered 2022-02-17 (×2): 3 mL via INTRAVENOUS

## 2022-02-16 MED ORDER — ACETAMINOPHEN 500 MG PO TABS
1000.0000 mg | ORAL_TABLET | Freq: Four times a day (QID) | ORAL | Status: DC
  Administered 2022-02-16 – 2022-02-21 (×11): 1000 mg via ORAL
  Filled 2022-02-16 (×13): qty 2

## 2022-02-16 MED ORDER — EPHEDRINE SULFATE-NACL 50-0.9 MG/10ML-% IV SOSY
PREFILLED_SYRINGE | INTRAVENOUS | Status: DC | PRN
  Administered 2022-02-16: 5 mg via INTRAVENOUS
  Administered 2022-02-16: 10 mg via INTRAVENOUS

## 2022-02-16 MED ORDER — ACETAMINOPHEN 160 MG/5ML PO SOLN: 1000.0000 mg | Freq: Four times a day (QID) | ORAL | Status: DC

## 2022-02-16 MED ORDER — FAMOTIDINE IN NACL 20-0.9 MG/50ML-% IV SOLN
20.0000 mg | Freq: Two times a day (BID) | INTRAVENOUS | Status: AC
  Administered 2022-02-16 (×2): 20 mg via INTRAVENOUS
  Filled 2022-02-16 (×2): qty 50

## 2022-02-16 MED ORDER — CHLORHEXIDINE GLUCONATE 4 % EX LIQD: 30.0000 mL | CUTANEOUS | Status: DC

## 2022-02-16 MED ORDER — METOPROLOL TARTRATE 5 MG/5ML IV SOLN
2.5000 mg | INTRAVENOUS | Status: DC | PRN
  Administered 2022-02-16 – 2022-02-17 (×2): 5 mg via INTRAVENOUS
  Filled 2022-02-16 (×2): qty 5

## 2022-02-16 MED ORDER — ALBUMIN HUMAN 5 % IV SOLN: INTRAVENOUS | Status: DC | PRN

## 2022-02-16 MED ORDER — CHLORHEXIDINE GLUCONATE 0.12 % MT SOLN
15.0000 mL | OROMUCOSAL | Status: AC
  Administered 2022-02-16: 15 mL via OROMUCOSAL
  Filled 2022-02-16: qty 15

## 2022-02-16 MED ORDER — VANCOMYCIN HCL IN DEXTROSE 1-5 GM/200ML-% IV SOLN
1000.0000 mg | Freq: Once | INTRAVENOUS | Status: AC
  Administered 2022-02-16: 1000 mg via INTRAVENOUS
  Filled 2022-02-16: qty 200

## 2022-02-16 MED ORDER — DEXMEDETOMIDINE HCL IN NACL 400 MCG/100ML IV SOLN
0.0000 ug/kg/h | INTRAVENOUS | Status: DC
  Administered 2022-02-16: 0.6 ug/kg/h via INTRAVENOUS
  Filled 2022-02-16: qty 100

## 2022-02-16 MED ORDER — BISACODYL 10 MG RE SUPP: 10.0000 mg | Freq: Every day | RECTAL | Status: DC

## 2022-02-16 MED ORDER — PANTOPRAZOLE SODIUM 40 MG PO TBEC
40.0000 mg | DELAYED_RELEASE_TABLET | Freq: Every day | ORAL | Status: DC
  Administered 2022-02-18 – 2022-02-21 (×4): 40 mg via ORAL
  Filled 2022-02-16 (×4): qty 1

## 2022-02-16 MED ORDER — ALBUMIN HUMAN 5 % IV SOLN: 250.0000 mL | INTRAVENOUS | Status: AC | PRN

## 2022-02-16 MED ORDER — MIDAZOLAM HCL (PF) 5 MG/ML IJ SOLN
INTRAMUSCULAR | Status: DC | PRN
  Administered 2022-02-16: 1 mg via INTRAVENOUS
  Administered 2022-02-16: 5 mg via INTRAVENOUS
  Administered 2022-02-16 (×2): 2 mg via INTRAVENOUS

## 2022-02-16 MED ORDER — DOBUTAMINE IN D5W 4-5 MG/ML-% IV SOLN: 0.0000 ug/kg/min | INTRAVENOUS | Status: DC

## 2022-02-16 MED ORDER — ~~LOC~~ CARDIAC SURGERY, PATIENT & FAMILY EDUCATION
Freq: Once | Status: DC
  Filled 2022-02-16: qty 1

## 2022-02-16 MED ORDER — MAGNESIUM SULFATE 4 GM/100ML IV SOLN
4.0000 g | Freq: Once | INTRAVENOUS | Status: AC
  Administered 2022-02-16: 4 g via INTRAVENOUS
  Filled 2022-02-16: qty 100

## 2022-02-16 MED ORDER — BISACODYL 5 MG PO TBEC
10.0000 mg | DELAYED_RELEASE_TABLET | Freq: Every day | ORAL | Status: DC
  Administered 2022-02-17 – 2022-02-21 (×3): 10 mg via ORAL
  Filled 2022-02-16 (×4): qty 2

## 2022-02-16 MED ORDER — OXYCODONE HCL 5 MG PO TABS
5.0000 mg | ORAL_TABLET | ORAL | Status: DC | PRN
  Administered 2022-02-16 – 2022-02-17 (×2): 5 mg via ORAL
  Administered 2022-02-18: 10 mg via ORAL
  Filled 2022-02-16 (×2): qty 1
  Filled 2022-02-16: qty 2

## 2022-02-16 MED ORDER — METOPROLOL TARTRATE 12.5 MG HALF TABLET: 12.5000 mg | ORAL_TABLET | Freq: Once | ORAL | Status: DC

## 2022-02-16 MED ORDER — PHENYLEPHRINE HCL-NACL 20-0.9 MG/250ML-% IV SOLN: 0.0000 ug/min | INTRAVENOUS | Status: DC

## 2022-02-16 MED ORDER — MIDAZOLAM HCL 2 MG/2ML IJ SOLN: 2.0000 mg | INTRAMUSCULAR | Status: DC | PRN

## 2022-02-16 MED ORDER — CHLORHEXIDINE GLUCONATE CLOTH 2 % EX PADS
6.0000 | MEDICATED_PAD | Freq: Every day | CUTANEOUS | Status: DC
  Administered 2022-02-16 – 2022-02-18 (×3): 6 via TOPICAL

## 2022-02-16 MED ORDER — ACETAMINOPHEN 650 MG RE SUPP
650.0000 mg | Freq: Once | RECTAL | Status: AC
  Administered 2022-02-16: 650 mg via RECTAL

## 2022-02-16 MED ORDER — POTASSIUM CHLORIDE 10 MEQ/50ML IV SOLN
10.0000 meq | INTRAVENOUS | Status: AC
  Administered 2022-02-16 (×3): 10 meq via INTRAVENOUS

## 2022-02-16 MED ORDER — DEXTROSE 50 % IV SOLN
0.0000 mL | INTRAVENOUS | Status: DC | PRN
  Filled 2022-02-16: qty 50

## 2022-02-16 MED ORDER — NITROGLYCERIN 0.2 MG/ML ON CALL CATH LAB
INTRAVENOUS | Status: DC | PRN
  Administered 2022-02-16: 20 ug via INTRAVENOUS

## 2022-02-16 MED ORDER — ONDANSETRON HCL 4 MG/2ML IJ SOLN
4.0000 mg | Freq: Four times a day (QID) | INTRAMUSCULAR | Status: DC | PRN
  Filled 2022-02-16: qty 2

## 2022-02-16 MED ORDER — TRAMADOL HCL 50 MG PO TABS
50.0000 mg | ORAL_TABLET | ORAL | Status: DC | PRN
  Administered 2022-02-16: 100 mg via ORAL
  Administered 2022-02-16 – 2022-02-17 (×2): 50 mg via ORAL
  Filled 2022-02-16: qty 1
  Filled 2022-02-16: qty 2
  Filled 2022-02-16: qty 1

## 2022-02-16 MED ORDER — SODIUM CHLORIDE (PF) 0.9 % IJ SOLN
OROMUCOSAL | Status: DC | PRN
  Administered 2022-02-16: 8 mL via TOPICAL

## 2022-02-16 MED ORDER — PROPOFOL 10 MG/ML IV BOLUS
INTRAVENOUS | Status: DC | PRN
  Administered 2022-02-16 (×3): 20 mg via INTRAVENOUS
  Administered 2022-02-16: 80 mg via INTRAVENOUS

## 2022-02-16 MED ORDER — SODIUM CHLORIDE 0.9 % IV SOLN: INTRAVENOUS | Status: DC | PRN

## 2022-02-16 MED ORDER — DOCUSATE SODIUM 100 MG PO CAPS
200.0000 mg | ORAL_CAPSULE | Freq: Every day | ORAL | Status: DC
  Administered 2022-02-17 – 2022-02-21 (×4): 200 mg via ORAL
  Filled 2022-02-16 (×5): qty 2

## 2022-02-16 MED ORDER — MIDAZOLAM HCL (PF) 10 MG/2ML IJ SOLN
INTRAMUSCULAR | Status: AC
  Filled 2022-02-16: qty 2

## 2022-02-16 MED ORDER — LACTATED RINGERS IV SOLN: 500.0000 mL | Freq: Once | INTRAVENOUS | Status: DC | PRN

## 2022-02-16 MED ORDER — GLYCOPYRROLATE PF 0.2 MG/ML IJ SOSY
PREFILLED_SYRINGE | INTRAMUSCULAR | Status: DC | PRN
  Administered 2022-02-16: .1 mg via INTRAVENOUS

## 2022-02-16 MED ORDER — SODIUM CHLORIDE 0.9 % IV SOLN: INTRAVENOUS | Status: DC

## 2022-02-16 MED ORDER — SUCCINYLCHOLINE CHLORIDE 200 MG/10ML IV SOSY
PREFILLED_SYRINGE | INTRAVENOUS | Status: DC | PRN
  Administered 2022-02-16: 120 mg via INTRAVENOUS

## 2022-02-16 MED ORDER — PROPOFOL 10 MG/ML IV BOLUS
INTRAVENOUS | Status: AC
  Filled 2022-02-16: qty 20

## 2022-02-16 MED FILL — Lidocaine HCl Local Preservative Free (PF) Inj 2%: INTRAMUSCULAR | Qty: 14 | Status: AC

## 2022-02-16 MED FILL — Heparin Sodium (Porcine) Inj 1000 Unit/ML: Qty: 1000 | Status: AC

## 2022-02-16 MED FILL — Potassium Chloride Inj 2 mEq/ML: INTRAVENOUS | Qty: 40 | Status: AC

## 2022-02-16 SURGICAL SUPPLY — 80 items
BAG DECANTER FOR FLEXI CONT (MISCELLANEOUS) ×2 IMPLANT
BLADE CLIPPER SURG (BLADE) ×2 IMPLANT
BLADE STERNUM SYSTEM 6 (BLADE) ×2 IMPLANT
BLADE SURG 11 STRL SS (BLADE) IMPLANT
BNDG ELASTIC 4X5.8 VLCR STR LF (GAUZE/BANDAGES/DRESSINGS) ×2 IMPLANT
BNDG ELASTIC 6X5.8 VLCR STR LF (GAUZE/BANDAGES/DRESSINGS) ×2 IMPLANT
BNDG GAUZE DERMACEA FLUFF 4 (GAUZE/BANDAGES/DRESSINGS) ×2 IMPLANT
CABLE SURGICAL S-101-97-12 (CABLE) ×2 IMPLANT
CANISTER SUCT 3000ML PPV (MISCELLANEOUS) ×2 IMPLANT
CANNULA MC2 2 STG 29/37 NON-V (CANNULA) ×2 IMPLANT
CANNULA MC2 TWO STAGE (CANNULA) ×2
CANNULA NON VENT 22FR 12 (CANNULA) IMPLANT
CATH ROBINSON RED A/P 18FR (CATHETERS) ×4 IMPLANT
CLIP FOGARTY SPRING 6M (CLIP) IMPLANT
CLIP VESOCCLUDE MED 24/CT (CLIP) IMPLANT
CLIP VESOCCLUDE SM WIDE 24/CT (CLIP) IMPLANT
CONN ST 1/2X1/2  BEN (MISCELLANEOUS) ×2
CONN ST 1/2X1/2 BEN (MISCELLANEOUS) ×2 IMPLANT
CONNECTOR BLAKE 2:1 CARIO BLK (MISCELLANEOUS) ×2 IMPLANT
CONTAINER PROTECT SURGISLUSH (MISCELLANEOUS) ×4 IMPLANT
DEFOGGER ANTIFOG KIT (MISCELLANEOUS) IMPLANT
DERMABOND ADVANCED .7 DNX12 (GAUZE/BANDAGES/DRESSINGS) IMPLANT
DRAIN CHANNEL 19F RND (DRAIN) ×6 IMPLANT
DRAPE CARDIOVASCULAR INCISE (DRAPES) ×2
DRAPE SRG 135X102X78XABS (DRAPES) ×2 IMPLANT
DRAPE WARM FLUID 44X44 (DRAPES) ×2 IMPLANT
DRSG AQUACEL AG ADV 3.5X10 (GAUZE/BANDAGES/DRESSINGS) ×2 IMPLANT
ELECT BLADE 4.0 EZ CLEAN MEGAD (MISCELLANEOUS) ×2
ELECT REM PT RETURN 9FT ADLT (ELECTROSURGICAL) ×4
ELECTRODE BLDE 4.0 EZ CLN MEGD (MISCELLANEOUS) ×2 IMPLANT
ELECTRODE REM PT RTRN 9FT ADLT (ELECTROSURGICAL) ×4 IMPLANT
FELT TEFLON 1X6 (MISCELLANEOUS) ×4 IMPLANT
GAUZE 4X4 16PLY ~~LOC~~+RFID DBL (SPONGE) ×2 IMPLANT
GAUZE SPONGE 4X4 12PLY STRL (GAUZE/BANDAGES/DRESSINGS) ×4 IMPLANT
GLOVE BIO SURGEON STRL SZ7 (GLOVE) ×4 IMPLANT
GLOVE BIOGEL M STRL SZ7.5 (GLOVE) ×4 IMPLANT
GOWN STRL REUS W/ TWL LRG LVL3 (GOWN DISPOSABLE) ×8 IMPLANT
GOWN STRL REUS W/ TWL XL LVL3 (GOWN DISPOSABLE) ×4 IMPLANT
GOWN STRL REUS W/TWL LRG LVL3 (GOWN DISPOSABLE) ×8
GOWN STRL REUS W/TWL XL LVL3 (GOWN DISPOSABLE) ×4
HEMOSTAT POWDER SURGIFOAM 1G (HEMOSTASIS) ×6 IMPLANT
INSERT SUTURE HOLDER (MISCELLANEOUS) ×2 IMPLANT
KIT BASIN OR (CUSTOM PROCEDURE TRAY) ×2 IMPLANT
KIT SUCTION CATH 14FR (SUCTIONS) ×2 IMPLANT
KIT TURNOVER KIT B (KITS) ×2 IMPLANT
KIT VASOVIEW HEMOPRO 2 VH 4000 (KITS) ×2 IMPLANT
LEAD PACING MYOCARDI (MISCELLANEOUS) IMPLANT
MARKER GRAFT CORONARY BYPASS (MISCELLANEOUS) ×6 IMPLANT
NS IRRIG 1000ML POUR BTL (IV SOLUTION) ×10 IMPLANT
PACK ACCESSORY CANNULA KIT (KITS) ×2 IMPLANT
PACK E OPEN HEART (SUTURE) ×2 IMPLANT
PACK OPEN HEART (CUSTOM PROCEDURE TRAY) ×2 IMPLANT
PAD ARMBOARD 7.5X6 YLW CONV (MISCELLANEOUS) ×4 IMPLANT
PAD ELECT DEFIB RADIOL ZOLL (MISCELLANEOUS) ×2 IMPLANT
PENCIL BUTTON HOLSTER BLD 10FT (ELECTRODE) ×2 IMPLANT
POSITIONER HEAD DONUT 9IN (MISCELLANEOUS) ×2 IMPLANT
PUNCH AORTIC ROTATE 4.0MM (MISCELLANEOUS) ×2 IMPLANT
SET MPS 3-ND DEL (MISCELLANEOUS) IMPLANT
SUPPORT HEART JANKE-BARRON (MISCELLANEOUS) ×2 IMPLANT
SUT BONE WAX W31G (SUTURE) ×2 IMPLANT
SUT ETHIBOND X763 2 0 SH 1 (SUTURE) ×4 IMPLANT
SUT MNCRL AB 3-0 PS2 18 (SUTURE) ×4 IMPLANT
SUT MNCRL AB 4-0 PS2 18 (SUTURE) IMPLANT
SUT PDS AB 1 CTX 36 (SUTURE) ×4 IMPLANT
SUT PROLENE 4 0 RB 1 (SUTURE) ×2
SUT PROLENE 4 0 SH DA (SUTURE) ×2 IMPLANT
SUT PROLENE 4-0 RB1 .5 CRCL 36 (SUTURE) IMPLANT
SUT PROLENE 5 0 C 1 36 (SUTURE) ×6 IMPLANT
SUT PROLENE 7 0 BV1 MDA (SUTURE) ×2 IMPLANT
SUT STEEL 6MS V (SUTURE) ×4 IMPLANT
SUT VIC AB 2-0 CT1 27 (SUTURE) ×2
SUT VIC AB 2-0 CT1 TAPERPNT 27 (SUTURE) IMPLANT
SYSTEM SAHARA CHEST DRAIN ATS (WOUND CARE) ×2 IMPLANT
TAPE CLOTH SURG 4X10 WHT LF (GAUZE/BANDAGES/DRESSINGS) IMPLANT
TAPE PAPER 2X10 WHT MICROPORE (GAUZE/BANDAGES/DRESSINGS) IMPLANT
TOWEL GREEN STERILE (TOWEL DISPOSABLE) ×2 IMPLANT
TOWEL GREEN STERILE FF (TOWEL DISPOSABLE) ×2 IMPLANT
TUBING LAP HI FLOW INSUFFLATIO (TUBING) ×2 IMPLANT
UNDERPAD 30X36 HEAVY ABSORB (UNDERPADS AND DIAPERS) ×2 IMPLANT
WATER STERILE IRR 1000ML POUR (IV SOLUTION) ×4 IMPLANT

## 2022-02-16 NOTE — Transfer of Care (Signed)
Immediate Anesthesia Transfer of Care Note  Patient: [name]  Procedure(s) Performed: CORONARY ARTERY BYPASS GRAFTING (CABG), ON PUMP, TIMES FOUR, USING LEFT INTERNAL MAMMARY ARTERY AND RIGHT ENDOSCOPICALLY HARVESTED GREATER SAPHENOUS VEIN (Chest) TRANSESOPHAGEAL ECHOCARDIOGRAM (TEE)  Patient Location: ICU  Anesthesia Type:General  Level of Consciousness: sedated and Patient remains intubated per anesthesia plan  Airway & Oxygen Therapy: Patient remains intubated per anesthesia plan and Patient placed on Ventilator (see vital sign flow sheet for setting)  Post-op Assessment: Report given to RN and Post -op Vital signs reviewed and stable  Post vital signs: Reviewed and stable  Last Vitals:  Vitals Value Taken Time  BP 109/68 02/16/22 1430  Temp    Pulse 73 02/16/22 1430  Resp 18 02/16/22 1430  SpO2 97 % 02/16/22 1430  Vitals shown include unvalidated device data.  Last Pain:  Vitals:   02/16/22 0709  TempSrc:   PainSc: 0-No pain      Patients Stated Pain Goal: 0 (85/88/50 2774)  Complications:  Encounter Notable Events  Notable Event Outcome Phase Comment  Difficult to intubate - expected  Intraprocedure Filed from anesthesia note documentation.

## 2022-02-16 NOTE — Procedures (Signed)
Extubation Procedure Note  Patient Details:   Name: [name] DOB: [birth year] MRN: [id number]   Airway Documentation:    Vent end date: 02/16/22 Vent end time: 1846   Evaluation  O2 sats: stable throughout and currently acceptable Complications: No apparent complications Patient did tolerate procedure well. Bilateral Breath Sounds: Diminished   Yes  Pt extubated to 2L [place] per Open Heart Rapid Wean Protocol. NIF -40, VC 1.7L, ABG within Normal Limits. Positive Cuff leak noted prior to extubation, Pt able to speak and no stridor noted post extubation. RT will continue to closely monitor pt  Jesse Sans 02/16/2022, 6:47 PM

## 2022-02-16 NOTE — Anesthesia Procedure Notes (Addendum)
Central Venous Catheter Insertion Performed by: Belinda Block, MD, anesthesiologist Start/End9/20/2023 8:16 AM, 02/16/2022 8:26 AM Patient location: Pre-op. Preanesthetic checklist: patient identified, IV checked, site marked, risks and benefits discussed, surgical consent, monitors and equipment checked, pre-op evaluation, timeout performed and anesthesia consent Lidocaine 1% used for infiltration and patient sedated Hand hygiene performed  and maximum sterile barriers used  Catheter size: 8.5 Fr Central line was placed.Sheath introducer Procedure performed using ultrasound guided technique. Ultrasound Notes:anatomy identified, needle tip was noted to be adjacent to the nerve/plexus identified, no ultrasound evidence of intravascular and/or intraneural injection and image(s) printed for medical record Attempts: 1 Following insertion, line sutured and dressing applied. Post procedure assessment: blood return through all ports, free fluid flow and no air  Patient tolerated the procedure well with no immediate complications.

## 2022-02-16 NOTE — Interval H&P Note (Signed)
History and Physical Interval Note:  02/16/2022 7:21 AM  [name]  has presented today for surgery, with the diagnosis of CAD MR.  The various methods of treatment have been discussed with the patient and family. After consideration of risks, benefits and other options for treatment, the patient has consented to  Procedure(s): CORONARY ARTERY BYPASS GRAFTING (CABG) (N/A) possible MITRAL VALVE REPAIR OR REPLACEMENT (MVR) (N/A) TRANSESOPHAGEAL ECHOCARDIOGRAM (TEE) (N/A) as a surgical intervention.  The patient's history has been reviewed, patient examined, no change in status, stable for surgery.  I have reviewed the patient's chart and labs.  Questions were answered to the patient's satisfaction.     [name]

## 2022-02-16 NOTE — Anesthesia Postprocedure Evaluation (Signed)
Anesthesia Post Note  Patient: CIRE CLUTE  Procedure(s) Performed: CORONARY ARTERY BYPASS GRAFTING (CABG), ON PUMP, TIMES FOUR, USING LEFT INTERNAL MAMMARY ARTERY AND RIGHT ENDOSCOPICALLY HARVESTED GREATER SAPHENOUS VEIN (Chest) TRANSESOPHAGEAL ECHOCARDIOGRAM (TEE)     Patient location during evaluation: SICU Anesthesia Type: General Level of consciousness: patient remains intubated per anesthesia plan Pain management: pain level controlled Vital Signs Assessment: post-procedure vital signs reviewed and stable Respiratory status: patient remains intubated per anesthesia plan Cardiovascular status: stable Postop Assessment: no apparent nausea or vomiting Anesthetic complications: yes   Encounter Notable Events  Notable Event Outcome Phase Comment  Difficult to intubate - expected  Intraprocedure Filed from anesthesia note documentation.    Last Vitals:  Vitals:   02/16/22 1730 02/16/22 1745  BP: 99/62 118/71  Pulse: 63 67  Resp: 12 13  Temp: (!) 36.3 C (!) 36.4 C  SpO2: 100% 100%    Last Pain:  Vitals:   02/16/22 0709  TempSrc:   PainSc: 0-No pain                 Kataleyah Carducci

## 2022-02-16 NOTE — Brief Op Note (Signed)
02/16/2022  2:19 PM  PATIENT:  [name]  76 y.o. male  PRE-OPERATIVE DIAGNOSIS:  CORONARY ARTERY DISEASE  POST-OPERATIVE DIAGNOSIS:  CORONARY ARTERY DISEASE  PROCEDURE:  CORONARY ARTERY BYPASS GRAFTING (CABG), ON PUMP, TIMES 4, USING LEFT INTERNAL MAMMARY ARTERY AND RIGHT ENDOSCOPICALLY HARVESTED GREATER SAPHENOUS VEIN -LIMA to LAD -SVG to PDA -SVG to OM2 -SVG to DIAGONAL  TRANSESOPHAGEAL ECHOCARDIOGRAM (TEE)   Vein harvest time: 14mn Vein prep time: 152m  SURGEON:  Surgeon(s) and Role:  LiLajuana MatteMD - Primary  PHYSICIAN ASSISTANT: BaWynelle BeckmannA-C, WaJadene PieriniA-C  ASSISTANTS: KiVernie MurdersNFA   ANESTHESIA:   general  EBL:  650 mL   BLOOD ADMINISTERED:none  DRAINS:  Mediastinal and pleural chest tubes    LOCAL MEDICATIONS USED:  NONE  SPECIMEN:  No Specimen  DISPOSITION OF SPECIMEN:  N/A  COUNTS CORRECT:  YES  DICTATION: .Dragon Dictation  PLAN OF CARE: Admit to inpatient   PATIENT DISPOSITION:  ICU - intubated and hemodynamically stable.   Delay start of Pharmacological VTE agent (>24hrs) due to surgical blood loss or risk of bleeding: yes

## 2022-02-16 NOTE — Hospital Course (Addendum)
**  follow up appointments for HL and cardiology requested on 9/21.  History of Present Illness:     [name] is a 76 year old male who  presents for surgical evaluation of three-vessel coronary artery disease, and mild to moderate mitral valve regurgitation. He has a history of stable angina but over the past several months he has had worsening exertional anginal symptoms and shortness of breath. Of late he has experienced anginal symptoms at rest and has been awoken from sleep with chest pain. This has been relieved with nitroglycerin. He remains active and does yard work, but has limitations due to his symptoms.   Of note he does have a history of a pulmonary embolism and an IVC filter in place. He currently does take Eliquis.   Cardiothoracic surgery was asked to consult on [name] for possible surgical revascularization and mitral valve repair or replacement. Dr. Kipp Brood reviewed the patient's chart, labs and diagnostic studies and determined that coronary artery bypass grafting with possible mitral valve repair or replacement dependent on the intraoperative TEE would provide this patient with the best long term treatment. Dr. Kipp Brood reviewed treatment options as well as the risks and benefits of surgery. After careful consideration, [name] was agreeable to move forward with surgery.  Hospital Course: [name] presented to [facility] and was brought to the operating room on 02/16/22. He underwent a CABG x 4 utilizing LIMA to LAD, SVG to PDA, SVG to OM2, SVG to DIAGONAL. Based on intraoperative TEE he did not require mitral valve repair or replacement. He tolerated the procedure well and was transferred to the SICU in stable condition.  Vital signs and hemodynamics remained stable.  He was extubated using standard protocol on the evening of surgery.  By the morning of the first postoperative day, he was awake and alert, sitting up eating breakfast.  He was not requiring any  vasopressor support and was maintaining oxygen saturation on room air.  Glucose was controlled with an insulin drip early postoperatively but this was transitioned to long-acting insulin and sliding scale insulin on postop day 1.  He was mobilized routinely.  Aspirin, low-dose beta-blocker, and statin were resumed.  He was also started back on his apixaban on postop day 1 for history of DVT with pulmonary emboli.Chest tubes and monitoring lines were removed on post-op day 2. He was mobilized in the ICU and was transferred to 4E Progressive Care.  The patient remained hypertensive and his home Cozaar was resumed.  He remained in NSR on Lopressor.  He had mild hyponatremia due to diuresis.  He was restarted on his home diabetic medications.  His surgical incisions are healing without evidence of infection.  He is ambulating without difficulty.  He is felt stable for discharge home today.

## 2022-02-16 NOTE — Progress Notes (Signed)
Initiated open heart rapid wean per protocol

## 2022-02-16 NOTE — Anesthesia Procedure Notes (Signed)
Arterial Line Insertion Start/End9/20/2023 8:30 AM, 02/16/2022 8:32 AM Performed by: Dorthea Cove, RN, CRNA  Patient location: Pre-op. Preanesthetic checklist: patient identified, IV checked, site marked, risks and benefits discussed and monitors and equipment checked Lidocaine 1% used for infiltration and patient sedated radial was placed Catheter size: 20 G Hand hygiene performed  and maximum sterile barriers used   Attempts: 2 (First attempt made by SRNA on left radius) Procedure performed without using ultrasound guided technique. Ultrasound Notes:anatomy identified, needle tip was noted to be adjacent to the nerve/plexus identified and no ultrasound evidence of intravascular and/or intraneural injection Post procedure assessment: unchanged
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
## 2022-02-16 NOTE — Anesthesia Procedure Notes (Addendum)
Procedure Name: Intubation Date/Time: 02/16/2022 8:57 AM  Performed by: Dorthea Cove, CRNAPre-anesthesia Checklist: Patient identified, Emergency Drugs available, Suction available and Patient being monitored Patient Re-evaluated:Patient Re-evaluated prior to induction Oxygen Delivery Method: Circle System Utilized Preoxygenation: Pre-oxygenation with 100% oxygen Induction Type: IV induction Ventilation: Mask ventilation without difficulty and Oral airway inserted - appropriate to patient size Laryngoscope Size: Glidescope and 4 Grade View: Grade I Tube type: Oral Tube size: 8.0 mm Number of attempts: 1 Airway Equipment and Method: Stylet, Oral airway and Video-laryngoscopy Placement Confirmation: ETT inserted through vocal cords under direct vision, positive ETCO2 and breath sounds checked- equal and bilateral Secured at: 23 cm Tube secured with: Tape Dental Injury: Teeth and Oropharynx as per pre-operative assessment  Difficulty Due To: Difficulty was anticipated and Difficult Airway- due to limited oral opening Comments: Intubated by Julieanne Cotton, under supervision by CRNA and MDA Nyoka Cowden

## 2022-02-16 NOTE — Op Note (Signed)
[address]       ,[place] [address]             [phone]                                          02/16/2022 Patient:  [name] Pre-Op Dx: Three-vessel coronary artery disease   Diabetes mellitus   Hypertension   Hyperlipidemia   History of pulmonary embolisms Post-op Dx: Same Procedure: CABG X 4.  LIMA to LAD, reverse saphenous vein graft to PDA, obtuse marginal, diagonal branch(jumped off the hood of the obtuse marginal vein graft) Endoscopic greater saphenous vein harvest on the right   Surgeon and Role:      * [name], Lucile Crater, MD - Primary    * B. Stehler , PA-C - assisting An experienced assistant was required given the complexity of this surgery and the standard of surgical care. The assistant was needed for exposure, dissection, suctioning, retraction of delicate tissues and sutures, instrument exchange and for overall help during this procedure.    Anesthesia  general EBL: 500 ml Blood Administration: None Xclamp Time: 60 min Pump Time: 111 min  Drains: 19 F blake drain: L, mediastinal  Wires: Ventricular Counts: correct   Indications: 76yo 3v CAD, mod MR.  The posterior leaflet appears somewhat retracted.  He has preserved biventricular function.  On review of his left heart catheterization, he has a CTO lesion to the circumflex which reconstitutes from left to left collaterals, and tight lesions in the LAD, diagonal, and RCA.  Given his age, we discussed the risks and benefits of a CABG with possible mitral valve repair/replacement.  He will require dental clearance.  The other option that we discussed was surgical revascularization and follow-up for potential MitraClip.  He will also need to hold Eliquis for 2 to 3 days.  Findings: Intraoperative TEE showed that the mitral valve regurgitation was mostly mild and central.  There is no structural abnormalities to the valve.    Good LIMA, good vein.  Intramyocardial LAD, small  PDA, small obtuse marginal, calcified diagonal branch.  Operative Technique: All invasive lines were placed in pre-op holding.  After the risks, benefits and alternatives were thoroughly discussed, the patient was brought to the operative theatre.  Anesthesia was induced, and the patient was prepped and draped in normal sterile fashion.  An appropriate surgical pause was performed, and pre-operative antibiotics were dosed accordingly.  We began with simultaneous incisions along the right leg for harvesting of the greater saphenous vein and the chest for the sternotomy.  In regards to the sternotomy, this was carried down with bovie cautery, and the sternum was divided with a reciprocating saw.  Meticulous hemostasis was obtained.  The left internal thoracic artery was exposed and harvested in in pedicled fashion.  The patient was systemically heparinized, and the artery was divided distally, and placed in a papaverine sponge.    The sternal elevator was removed, and a retractor was placed.  The pericardium was divided in the midline and fashioned into a cradle with pericardial stitches.   After we confirmed an appropriate ACT, the ascending aorta was cannulated in standard fashion.  The right atrial appendage was used for venous cannulation site.  Cardiopulmonary bypass was initiated, and the heart retractor was placed. The cross clamp was applied, and a dose of  anterograde cardioplegia was given with good arrest of the heart.  We moved to the posterior wall of the heart, and found a good target on the PDA.  An arteriotomy was made, and the vein graft was anastomosed to it in an end to side fashion.  Next we exposed the lateral wall, and found a good target on the obtuse marginal.  An end to side anastomosis with the vein graft was then created.  Next, we exposed the anterior wall of the heart and identified a good target on diagonal branch.   An arteriotomy was created.  The vein was anastomosed in an end to  side fashion.  Finally, we exposed a good target on the LAD, and fashioned an end to side anastomosis between it and the LITA.  We began to re-warm, and a re-animation dose of cardioplegia was given.  The heart was de-aired, and the cross clamp was removed.  Meticulous hemostasis was obtained.    A partial occludding clamp was then placed on the ascending aorta, and we created an end to side anastomosis between it and the proximal vein grafts.  The diagonal vein graft was jumped off the hood of the obtuse marginal vein.  Rings were placed on the proximal anastomosis.  Hemostasis was obtained, and we separated from cardiopulmonary bypass without event.  The heparin was reversed with protamine.  Chest tubes and wires were placed, and the sternum was re-approximated with sternal wires.  The soft tissue and skin were re-approximated wth absorbable suture.    The patient tolerated the procedure without any immediate complications, and was transferred to the ICU in guarded condition.  [name]

## 2022-02-17 ENCOUNTER — Other Ambulatory Visit: Payer: Self-pay

## 2022-02-17 ENCOUNTER — Inpatient Hospital Stay (HOSPITAL_COMMUNITY): Payer: Medicare Other

## 2022-02-17 ENCOUNTER — Encounter (HOSPITAL_COMMUNITY): Payer: Self-pay | Admitting: Thoracic Surgery (Cardiothoracic Vascular Surgery)

## 2022-02-17 DIAGNOSIS — I2510 Atherosclerotic heart disease of native coronary artery without angina pectoris: Secondary | ICD-10-CM | POA: Diagnosis not present

## 2022-02-17 LAB — BASIC METABOLIC PANEL
Anion gap: 8 (ref 5–15)
Anion gap: 5 (ref 5–15)
BUN: 15 mg/dL (ref 8–23)
BUN: 19 mg/dL (ref 8–23)
CO2: 19 mmol/L — ABNORMAL LOW (ref 22–32)
CO2: 20 mmol/L — ABNORMAL LOW (ref 22–32)
Calcium: 8.6 mg/dL — ABNORMAL LOW (ref 8.9–10.3)
Calcium: 8.4 mg/dL — ABNORMAL LOW (ref 8.9–10.3)
Chloride: 109 mmol/L (ref 98–111)
Chloride: 105 mmol/L (ref 98–111)
Creatinine, Ser: 1.11 mg/dL (ref 0.61–1.24)
Creatinine, Ser: 1.22 mg/dL (ref 0.61–1.24)
GFR, Estimated: >60 mL/min (ref >60)
GFR, Estimated: >60 mL/min (ref >60)
Glucose, Bld: 130 mg/dL — ABNORMAL HIGH (ref 70–99)
Glucose, Bld: 264 mg/dL — ABNORMAL HIGH (ref 70–99)
Potassium: 4.4 mmol/L (ref 3.5–5.1)
Potassium: 4.3 mmol/L (ref 3.5–5.1)
Sodium: 136 mmol/L (ref 135–145)
Sodium: 130 mmol/L — ABNORMAL LOW (ref 135–145)

## 2022-02-17 LAB — CBC
HCT: 29.3 % — ABNORMAL LOW (ref 39.0–52.0)
HCT: 29.5 % — ABNORMAL LOW (ref 39.0–52.0)
Hemoglobin: 9.7 g/dL — ABNORMAL LOW (ref 13.0–17.0)
Hemoglobin: 9.7 g/dL — ABNORMAL LOW (ref 13.0–17.0)
MCH: 31.4 pg (ref 26.0–34.0)
MCH: 31.3 pg (ref 26.0–34.0)
MCHC: 33.1 g/dL (ref 30.0–36.0)
MCHC: 32.9 g/dL (ref 30.0–36.0)
MCV: 94.8 fL (ref 80.0–100.0)
MCV: 95.2 fL (ref 80.0–100.0)
Platelets: 106 10*3/uL — ABNORMAL LOW (ref 150–400)
Platelets: 125 10*3/uL — ABNORMAL LOW (ref 150–400)
RBC: 3.09 MIL/uL — ABNORMAL LOW (ref 4.22–5.81)
RBC: 3.1 MIL/uL — ABNORMAL LOW (ref 4.22–5.81)
RDW: 14.3 % (ref 11.5–15.5)
RDW: 14.4 % (ref 11.5–15.5)
WBC: 7.8 10*3/uL (ref 4.0–10.5)
WBC: 10.1 10*3/uL (ref 4.0–10.5)
nRBC: 0 % (ref 0.0–0.2)
nRBC: 0 % (ref 0.0–0.2)

## 2022-02-17 LAB — GLUCOSE, CAPILLARY
Glucose-Capillary: 127 mg/dL — ABNORMAL HIGH (ref 70–99)
Glucose-Capillary: 129 mg/dL — ABNORMAL HIGH (ref 70–99)
Glucose-Capillary: 133 mg/dL — ABNORMAL HIGH (ref 70–99)
Glucose-Capillary: 134 mg/dL — ABNORMAL HIGH (ref 70–99)
Glucose-Capillary: 147 mg/dL — ABNORMAL HIGH (ref 70–99)
Glucose-Capillary: 172 mg/dL — ABNORMAL HIGH (ref 70–99)
Glucose-Capillary: 193 mg/dL — ABNORMAL HIGH (ref 70–99)
Glucose-Capillary: 194 mg/dL — ABNORMAL HIGH (ref 70–99)
Glucose-Capillary: 196 mg/dL — ABNORMAL HIGH (ref 70–99)
Glucose-Capillary: 231 mg/dL — ABNORMAL HIGH (ref 70–99)
Glucose-Capillary: 273 mg/dL — ABNORMAL HIGH (ref 70–99)
Glucose-Capillary: 58 mg/dL — ABNORMAL LOW (ref 70–99)
Glucose-Capillary: 88 mg/dL (ref 70–99)

## 2022-02-17 LAB — MAGNESIUM
Magnesium: 2.5 mg/dL — ABNORMAL HIGH (ref 1.7–2.4)
Magnesium: 2.2 mg/dL (ref 1.7–2.4)

## 2022-02-17 MED ORDER — INSULIN ASPART 100 UNIT/ML IJ SOLN
0.0000 [IU] | INTRAMUSCULAR | Status: DC
  Administered 2022-02-17 (×2): 4 [IU] via SUBCUTANEOUS
  Administered 2022-02-17: 12 [IU] via SUBCUTANEOUS
  Administered 2022-02-17: 4 [IU] via SUBCUTANEOUS
  Administered 2022-02-17: 8 [IU] via SUBCUTANEOUS
  Administered 2022-02-18 (×2): 4 [IU] via SUBCUTANEOUS
  Administered 2022-02-18: 8 [IU] via SUBCUTANEOUS
  Administered 2022-02-18: 2 [IU] via SUBCUTANEOUS

## 2022-02-17 MED ORDER — INSULIN DETEMIR 100 UNIT/ML ~~LOC~~ SOLN
10.0000 [IU] | Freq: Every day | SUBCUTANEOUS | Status: DC
  Administered 2022-02-17 – 2022-02-18 (×2): 10 [IU] via SUBCUTANEOUS
  Filled 2022-02-17 (×4): qty 0.1

## 2022-02-17 MED ORDER — INSULIN DETEMIR 100 UNIT/ML ~~LOC~~ SOLN
17.0000 [IU] | Freq: Every day | SUBCUTANEOUS | Status: DC
  Administered 2022-02-17: 17 [IU] via SUBCUTANEOUS
  Filled 2022-02-17 (×2): qty 0.17

## 2022-02-17 MED ORDER — ROSUVASTATIN CALCIUM 20 MG PO TABS
20.0000 mg | ORAL_TABLET | Freq: Every day | ORAL | Status: DC
  Administered 2022-02-17 – 2022-02-21 (×5): 20 mg via ORAL
  Filled 2022-02-17 (×5): qty 1

## 2022-02-17 MED ORDER — SODIUM CHLORIDE 0.9% FLUSH
10.0000 mL | Freq: Two times a day (BID) | INTRAVENOUS | Status: DC
  Administered 2022-02-17: 40 mL

## 2022-02-17 MED ORDER — SODIUM CHLORIDE 0.9% FLUSH: 10.0000 mL | INTRAVENOUS | Status: DC | PRN

## 2022-02-17 MED ORDER — ASPIRIN 81 MG PO CHEW
81.0000 mg | CHEWABLE_TABLET | Freq: Every day | ORAL | Status: DC
  Administered 2022-02-17 – 2022-02-21 (×5): 81 mg via ORAL
  Filled 2022-02-17 (×5): qty 1

## 2022-02-17 MED ORDER — ORAL CARE MOUTH RINSE: 15.0000 mL | OROMUCOSAL | Status: DC | PRN

## 2022-02-17 MED ORDER — ENOXAPARIN SODIUM 30 MG/0.3ML IJ SOSY: 30.0000 mg | PREFILLED_SYRINGE | Freq: Every day | INTRAMUSCULAR | Status: DC

## 2022-02-17 MED ORDER — APIXABAN 5 MG PO TABS
5.0000 mg | ORAL_TABLET | Freq: Two times a day (BID) | ORAL | Status: DC
  Administered 2022-02-17 – 2022-02-21 (×9): 5 mg via ORAL
  Filled 2022-02-17 (×9): qty 1

## 2022-02-17 NOTE — Inpatient Diabetes Management (Signed)
Inpatient Diabetes Program Recommendations  AACE/ADA: New Consensus Statement on Inpatient Glycemic Control (2015)  Target Ranges:  Prepandial:   less than 140 mg/dL      Peak postprandial:   less than 180 mg/dL (1-2 hours)      Critically ill patients:  140 - 180 mg/dL    Latest Reference Range & Units 02/17/22 02:03 02/17/22 03:06 02/17/22 04:59 02/17/22 07:40 02/17/22 08:44 02/17/22 09:59 02/17/22 11:15  Glucose-Capillary 70 - 99 mg/dL 133 (H) 127 (H) 129 (H) 147 (H) 172 (H) 196 (H)  4 units Novolog '@1011'$   IV Insulin Drip Stopped '@1024'$  194 (H)  (H): Data is abnormally high     Home DM Meds: Basaglar 35 units QHS       Jardiance 25 mg daily       Metformin 500 mg BID  Current Orders: Novolog 0-24 units Q4 hours    MD- Note pt transitioned to SQ Insulin this AM.  Takes Basaglar long-acting insulin at home.  None ordered at present.  Would you consider starting Levemir 17 units Daily to start today?  (50% home dose)    --Will follow patient during hospitalization--  Wyn Quaker RN, MSN, [place] Diabetes Coordinator Inpatient Glycemic Control Team Team Pager: [phone] (8a-5p)

## 2022-02-17 NOTE — Progress Notes (Signed)
CT surgery PM rounds  Patient had stable day, off pressors and maintaining sinus rhythm Currently up to bedside commode P.m. labs demonstrate potassium 4.3 hemoglobin 9.74 blood sugar 260. Net INO balance this shift -500 cc with Lasix diuresis Add Levemir HS dose for glucose coverage  Blood pressure 116/69, pulse 76, temperature 98.3 F (36.8 C), temperature source Oral, resp. rate 17, height 6' (1.829 m), weight 82.7 kg, SpO2 96 %.

## 2022-02-17 NOTE — TOC Initial Note (Signed)
Transition of Care [facility]) - Initial/Assessment Note    Patient Details  Name: [name] MRN: [id number] Date of Birth: May 17, 1946  [facility] CM/SW Contact:    Bethena Roys, RN Phone Number: 02/17/2022, 12:41 PM  Clinical Narrative:  Patient POD-1 CABG. PTA patient was independent from home with spouse and adult children. Patient states he was driving to appointments and getting his medications without any issues. Case Manager will continue to follow for additional transition of care needs.              Expected Discharge Plan: [place] Barriers to Discharge: Continued Medical Work up   Patient Goals and CMS Choice Patient states their goals for this hospitalization and ongoing recovery are:: to return home      Expected Discharge Plan and Services Expected Discharge Plan: [place] In-house Referral: NA Discharge Planning Services: CM Consult   Living arrangements for the past 2 months: Single Family Home                  Prior Living Arrangements/Services Living arrangements for the past 2 months: Single Family Home Lives with:: Adult Children, Spouse Patient language and need for interpreter reviewed:: Yes Do you feel safe going back to the place where you live?: Yes      Need for Family Participation in Patient Care: Yes (Comment) Care giver support system in place?: Yes (comment)   Criminal Activity/Legal Involvement Pertinent to Current Situation/Hospitalization: No - Comment as needed  Activities of Daily Living Home Assistive Devices/Equipment: Hearing aid ADL Screening (condition at time of admission) Patient's cognitive ability adequate to safely complete daily activities?: Yes Is the patient deaf or have difficulty hearing?: Yes Does the patient have difficulty seeing, even when wearing glasses/contacts?: No Does the patient have difficulty concentrating, remembering, or making decisions?:  No Patient able to express need for assistance with ADLs?: Yes Does the patient have difficulty dressing or bathing?: No Independently performs ADLs?: Yes (appropriate for developmental age) Does the patient have difficulty walking or climbing stairs?: No Weakness of Legs: None Weakness of Arms/Hands: None  Permission Sought/Granted Permission sought to share information with : Family Supports, Case Manager   Emotional Assessment Appearance:: Appears stated age Attitude/Demeanor/Rapport: Engaged Affect (typically observed): Appropriate Orientation: : Oriented to Situation, Oriented to  Time, Oriented to Place, Oriented to Self Alcohol / Substance Use: Not Applicable Psych Involvement: No (comment)  Admission diagnosis:  S/P CABG (coronary artery bypass graft) [Z95.1] Patient Active Problem List   Diagnosis Date Noted   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   Coronary artery disease of native artery of native heart with stable angina pectoris (HCC)    Abnormal cardiac CT angiography    Left hand weakness 02/28/2020   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

## 2022-02-17 NOTE — Discharge Summary (Signed)
[address]       [place] [address]             [phone]    Physician Discharge Summary  Patient ID: [name] MRN: [id number] DOB/AGE: Oct 23, 1945 76 y.o.  Admit date: 02/16/2022 Discharge date: 02/22/2022  Admission Diagnoses:  Patient Active Problem List   Diagnosis Date Noted   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   Coronary artery disease of native artery of native heart with stable angina pectoris (HCC)    Abnormal cardiac CT angiography    Left hand weakness 02/28/2020   Other pulmonary embolism without acute cor pulmonale ([place]) 02/28/2020   Malignant neoplasm of urinary bladder ([place]) 02/28/2020   Multiple pulmonary nodules 06/11/2016   Dyspnea on exertion 06/10/2016     Discharge Diagnoses:  Patient Active Problem List   Diagnosis Date Noted   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   Coronary artery disease of native artery of native heart with stable angina pectoris (HCC)    Abnormal cardiac CT angiography    Left hand weakness 02/28/2020   Other pulmonary embolism without acute cor pulmonale ([place]) 02/28/2020   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  History of Present Illness:     [name] is a 76 year old male who  presents for surgical evaluation of three-vessel coronary artery disease, and mild to moderate mitral valve regurgitation. He has a history of stable angina but over the past several months he has had worsening exertional anginal symptoms and shortness of breath. Of late he has experienced anginal symptoms at rest and has been awoken from sleep with chest pain. This has been relieved with nitroglycerin. He remains active and does yard work, but has limitations due to his symptoms.   Of note he does  have a history of a pulmonary embolism and an IVC filter in place. He currently does take Eliquis.   Cardiothoracic surgery was asked to consult on [name] for possible surgical revascularization and mitral valve repair or replacement. Dr. Kipp Brood reviewed the patient's chart, labs and diagnostic studies and determined that coronary artery bypass grafting with possible mitral valve repair or replacement dependent on the intraoperative TEE would provide this patient with the best long term treatment. Dr. Kipp Brood reviewed treatment options as well as the risks and benefits of surgery. After careful consideration, [name] was agreeable to move forward with surgery.  Hospital Course: [name] presented to [facility] and was brought to the operating room on 02/16/22. He underwent a CABG x 4 utilizing LIMA to LAD, SVG to PDA, SVG to OM2, SVG to DIAGONAL. Based on intraoperative TEE he did not require mitral valve repair or replacement. He tolerated the procedure well and was transferred to the SICU in stable condition.  Vital signs and hemodynamics remained stable.  He was extubated using standard protocol on the evening of surgery.  By the morning of the first postoperative day, he was awake and alert, sitting up eating breakfast.  He was not requiring any vasopressor support and was maintaining oxygen saturation on room air.  Glucose was controlled with an insulin drip early postoperatively but this was transitioned to long-acting insulin and sliding scale insulin on postop day 1.  He was mobilized routinely.  Aspirin, low-dose beta-blocker, and statin were resumed.  He was also started back on his apixaban on postop day 1 for history of DVT with pulmonary emboli.Chest tubes and monitoring lines were removed on post-op day 2. He was mobilized in the ICU and was transferred to [facility]E [facility].  The patient remained hypertensive and his home Cozaar was resumed.  He remained in NSR on Lopressor.   He had mild hyponatremia due to diuresis.  He was restarted on his home diabetic medications.  His surgical incisions are healing without evidence of infection.  He is ambulating without difficulty.  He is felt stable for discharge home today.  Consults: None  Lab Results: Recent Labs (last 2 labs)      Recent Labs    02/19/22 0251  WBC 10.1  HGB 9.0*  HCT 26.1*  PLT 106*      BMET:  Recent Labs (last 2 labs)      Recent Labs    02/19/22 0251  NA 134*  K 4.4  CL 101  CO2 24  GLUCOSE 170*  BUN 19  CREATININE 1.13  CALCIUM 9.0      PT/INR:  Recent Labs (last 2 labs)   No results for input(s): "LABPROT", "INR" in the last 72 hours.   ABG Labs (Brief)          Component Value Date/Time    PHART 7.353 02/16/2022 2002    HCO3 18.4 (L) 02/16/2022 2002    TCO2 19 (L) 02/16/2022 2002    ACIDBASEDEF 7.0 (H) 02/16/2022 2002    O2SAT 96 02/16/2022 2002      CBG (last 3)  Recent Labs (last 2 labs    Significant Diagnostic Studies:   DG Chest Port 1 View  Result Date: 02/18/2022 CLINICAL DATA:  Coronary bypass EXAM: PORTABLE CHEST 1 VIEW COMPARISON:  02/17/2022 FINDINGS: Coronary bypass changes noted with median sternotomy wires appearing intact. Stable cardiomegaly, central vascular congestion and basilar atelectasis. Trace pleural effusions as before. No significant or enlarging pneumothorax. Stable right IJ central line, mediastinal drain, and left chest tube. Aorta atherosclerotic. IMPRESSION: Stable postoperative chest exam over 24 hours. Electronically Signed   By: Jerilynn Mages.  Shick M.D.   On: 02/18/2022 06:54   ECHO INTRAOPERATIVE TEE  Result Date: 02/18/2022  *INTRAOPERATIVE TRANSESOPHAGEAL REPORT *  Patient Name:   [name] Date of Exam: 02/16/2022 Medical Rec #:  [id number]       Height:       72.0 in Accession #:    [id number]      Weight:       183.0 lb Date of Birth:  [birth year]      BSA:          2.05 m Patient Age:    50 years        BP:           145/68  mmHg Patient Gender: M               HR:           54 bpm. Exam Location:  Anesthesiology Transesophogeal exam was perform intraoperatively during surgical procedure. Patient was closely monitored under general anesthesia during the entirety of examination. Indications:     CAD, multiple vessel [I25.10 (ICD-10-CM)]; Moderate mitral                  regurgitation [I34.0 (ICD-10-CM)] Performing Phys: [id number] Lucile Crater LIGHTFOOT Diagnosing Phys: Belinda Block MD POST-OP IMPRESSIONS _ Left Ventricle:  Post Bypass: The patient came off bypass on the initial attempt. Left ventricular contraction did improve with volume replacement. There did not appear to be any new findings form preop exam. The TEE that had been placed after general anesthesia uneventfully was removed at the end of the case without difficulty. The patient was later taken to the SICU in stable condition. _ Right Ventricle: normal function. PRE-OP FINDINGS  Left Ventricle: The left ventricle has low normal systolic function, with an ejection fraction of 50-55%. Right Ventricle: The right ventricle has normal systolic function. Left Atrium: No left atrial/left atrial appendage thrombus was detected. Mitral Valve: The mitral valve is normal in structure. Mitral valve regurgitation is moderate by color flow Doppler. Tricuspid Valve: The tricuspid valve was normal in structure. Aortic Valve: The aortic valve is tricuspid Aortic valve regurgitation is trivial by color flow Doppler. Pulmonic Valve: The pulmonic valve was normal in structure.  +----------------+-----------++ MR Peak grad:   109.0 mmHg  +----------------+-----------++ MR Mean grad:   77.0 mmHg   +----------------+-----------++ MR Vmax:        522.00 cm/s +----------------+-----------++ MR Vmean:       418.0 cm/s  +----------------+-----------++ MR PISA:        1.01 cm    +----------------+-----------++ MR PISA Eff ROA:7 mm       +----------------+-----------++ MR PISA  Radius: 0.40 cm     +----------------+-----------++  Belinda Block MD Electronically signed by Belinda Block MD Signature Date/Time: 02/18/2022/2:28:56 AM    Final    DG Chest Port 1 View  Result Date: 02/17/2022 CLINICAL DATA:  Follow-up post CABG.  [place]  [id number]. EXAM: PORTABLE CHEST 1 VIEW COMPARISON:  Portable chest yesterday are 4:01 p.m. FINDINGS: 5:35 a.m. interval extubation and removal NGT. Right IJ catheter through an introducer sheath terminates in the distal SVC as before. There is a left chest tube and mediastinal drain remaining in place. There is no visible pneumothorax. There is a small left pleural effusion and patchy consolidation or atelectasis in the left base appears similar with no new, further or worsening lung opacity. There is stable mediastinal configuration with aortic atherosclerosis. There is mild cardiomegaly with normal caliber of the central vessels and no pulmonary edema findings. IMPRESSION: Overall aeration is not significantly changed following extubation. Stable appearance of small left pleural effusion and overlying basilar lung opacities. Electronically Signed   By: Telford Nab M.D.   On: 02/17/2022 07:51   DG Chest Port 1 View  Result Date: 02/16/2022 CLINICAL DATA:  Status post coronary bypass graft. EXAM: PORTABLE CHEST 1 VIEW COMPARISON:  February 14, 2022. FINDINGS: Endotracheal and nasogastric tubes are in grossly good position. Right internal jugular catheter is noted with distal tip in expected position of the SVC. Left-sided chest tube is noted without pneumothorax. Mild bibasilar subsegmental atelectasis is noted. Small left pleural effusion is noted. IMPRESSION: Endotracheal and nasogastric tubes are in grossly good position. Left-sided chest tube without pneumothorax. Mild bibasilar atelectasis is noted. Small left pleural effusion is noted. Electronically Signed   By: Marijo Conception M.D.   On: 02/16/2022 16:21   VAS US DOPPLER PRE  CABG  Result Date: 02/14/2022 PREOPERATIVE VASCULAR EVALUATION Patient Name:  ANIKETH HUBERTY  Date of Exam:   02/14/2022 Medical Rec #: [id number]        Accession #:    [id number] Date of Birth: 03/24/1946       Patient Gender: M Patient Age:   9 years Exam [facility] Procedure:      VAS US DOPPLER PRE CABG Referring Phys: HARRELL LIGHTFOOT --------------------------------------------------------------------------------  Risk Factors: Hypertension, hyperlipidemia, Diabetes. Performing Technologist: Archie Patten RVS  Examination Guidelines: A complete evaluation includes B-mode imaging, spectral Doppler, color Doppler, and power Doppler as needed of all accessible portions of each vessel. Bilateral testing is considered an integral part of a complete examination. Limited examinations for reoccurring indications may be performed as noted.  Right Carotid Findings: +----------+--------+--------+--------+------------+--------+           PSV cm/sEDV cm/sStenosisDescribe    Comments +----------+--------+--------+--------+------------+--------+ CCA Prox  95      4               heterogenous         +----------+--------+--------+--------+------------+--------+ CCA Distal94      12              heterogenous         +----------+--------+--------+--------+------------+--------+ ICA Prox  146     15      1-39%   heterogenous         +----------+--------+--------+--------+------------+--------+ ICA Distal81      19                                   +----------+--------+--------+--------+------------+--------+ ECA       143                                          +----------+--------+--------+--------+------------+--------+ +----------+--------+-------+--------+------------+           PSV cm/sEDV cmsDescribeArm Pressure +----------+--------+-------+--------+------------+ Subclavian76                                   +----------+--------+-------+--------+------------+ +---------+--------+--+--------+-+---------+ VertebralPSV cm/s38EDV cm/s5Antegrade +---------+--------+--+--------+-+---------+ Left Carotid Findings: +----------+--------+--------+--------+------------+--------+           PSV cm/sEDV cm/sStenosisDescribe    Comments +----------+--------+--------+--------+------------+--------+ CCA Prox  79      13              heterogenous         +----------+--------+--------+--------+------------+--------+ CCA Distal85      18              heterogenous         +----------+--------+--------+--------+------------+--------+ ICA Prox  93      29      1-39%   heterogenous         +----------+--------+--------+--------+------------+--------+ ICA Distal119     27                                   +----------+--------+--------+--------+------------+--------+ ECA       220                                          +----------+--------+--------+--------+------------+--------+ +----------+--------+--------+--------+------------+ SubclavianPSV cm/sEDV cm/sDescribeArm Pressure +----------+--------+--------+--------+------------+           131                                  +----------+--------+--------+--------+------------+ +---------+--------+--+--------+-+---------+ VertebralPSV cm/s39EDV cm/s6Antegrade +---------+--------+--+--------+-+---------+  ABI Findings: +---------+------------------+-----+---------+--------+ Right    Rt Pressure (mmHg)IndexWaveform Comment  +---------+------------------+-----+---------+--------+ Brachial 137                    triphasic         +---------+------------------+-----+---------+--------+ PTA      155               1.08 triphasic         +---------+------------------+-----+---------+--------+ DP       151               1.05 triphasic         +---------+------------------+-----+---------+--------+ Great Toe105                0.73 Normal            +---------+------------------+-----+---------+--------+ +---------+------------------+-----+---------+-------+ Left     Lt Pressure (mmHg)IndexWaveform Comment +---------+------------------+-----+---------+-------+ Brachial 144                    triphasic        +---------+------------------+-----+---------+-------+ PTA      166               1.15 triphasic        +---------+------------------+-----+---------+-------+ DP       159               1.10 triphasic        +---------+------------------+-----+---------+-------+ Great Toe127               0.88 Normal           +---------+------------------+-----+---------+-------+ +-------+---------------+----------------+ ABI/TBIToday's ABI/TBIPrevious ABI/TBI +-------+---------------+----------------+ Right  1.08                            +-------+---------------+----------------+ Left   1.15                            +-------+---------------+----------------+  Right Doppler Findings: +--------+--------+-----+---------+--------+ Site    PressureIndexDoppler  Comments +--------+--------+-----+---------+--------+ [id number]          triphasic         +--------+--------+-----+---------+--------+ Radial               triphasic         +--------+--------+-----+---------+--------+ Ulnar                triphasic         +--------+--------+-----+---------+--------+  Left Doppler Findings: +--------+--------+-----+---------+--------+ Site    PressureIndexDoppler  Comments +--------+--------+-----+---------+--------+ [id number]          triphasic         +--------+--------+-----+---------+--------+ Radial               triphasic         +--------+--------+-----+---------+--------+ Ulnar                triphasic         +--------+--------+-----+---------+--------+   Summary: Right Carotid: Velocities in the right ICA are consistent with a 1-39%  stenosis. Left Carotid: Velocities in the left ICA are consistent with a 1-39% stenosis. Vertebrals: Bilateral vertebral arteries demonstrate antegrade flow. Right ABI: Resting right ankle-brachial index is within normal range. Left ABI: Resting left ankle-brachial index is within normal range. Right Upper Extremity: Doppler waveforms remain within normal limits with right radial compression. Doppler waveforms remain within normal limits with right ulnar compression. Left Upper Extremity: Doppler waveforms decrease 50% with left radial compression.  Doppler waveforms remain within normal limits with left ulnar compression.  Electronically signed by Monica Martinez MD on 02/14/2022 at 3:02:44 PM.    Final    DG Chest 2 View  Result Date: 02/14/2022 CLINICAL DATA:  76 year old male with a history of preoperative CABG chest x-ray EXAM: CHEST - 2 VIEW COMPARISON:  11/03/2021 FINDINGS: Cardiomediastinal silhouette unchanged in size and contour. History calcifications of the aorta. No evidence of central vascular congestion. No interlobular septal thickening. Stigmata of emphysema, with increased retrosternal airspace, flattened hemidiaphragms, increased AP diameter, and hyperinflation on the AP view. No pneumothorax or pleural effusion. Coarsened interstitial markings, with no confluent airspace disease. No acute displaced fracture. Degenerative changes of the spine. IMPRESSION: Negative for acute cardiopulmonary disease Electronically Signed   By: Corrie Mckusick D.O.   On: 02/14/2022 09:19   ECHOCARDIOGRAM COMPLETE  Result Date: 02/07/2022    ECHOCARDIOGRAM REPORT   Patient Name:   MARQUIZ SOTELO Date of Exam: 02/07/2022 Medical Rec #:  [id number]       Height:       72.0 in Accession #:    [id number]      Weight:       184.0 lb Date of Birth:  [birth year]      BSA:          2.056 m Patient Age:    65 years        BP:           110/50 mmHg Patient Gender: M               HR:           59 bpm. Exam Location:   [facility] Procedure: 2D Echo, Cardiac Doppler, Color Doppler, 3D Echo and Strain Analysis Indications:    I25.10 CAD  History:        Patient has prior history of Echocardiogram examinations, most                 recent 06/28/2016. CAD; Risk Factors:Former Smoker, Hypertension,                 Diabetes and Dyslipidemia.  Sonographer:    Coralyn Helling RDCS Referring Phys: [place]  1. Left ventricular ejection fraction, by estimation, is 55 to 60%. Left ventricular ejection fraction by 3D volume is 55 %. The left ventricle has normal function. The left ventricle demonstrates regional wall motion abnormalities (see scoring diagram/findings for description). The mid-to-apical inferior LV wall appears hypokinetic. Left ventricular diastolic parameters were normal.  2. Right ventricular systolic function is normal. The right ventricular size is normal. There is normal pulmonary artery systolic pressure. The estimated right ventricular systolic pressure is 28.7 mmHg.  3. The mitral valve is grossly normal. There is likely moderate mitral regurgitation with one central jet and one posteriorly directed jet. Given plans for CABG, may want to consider TEE for further evaluation.  4. The aortic valve is tricuspid. Aortic valve regurgitation is trivial. Aortic valve sclerosis is present, with no evidence of aortic valve stenosis.  5. Aortic dilatation noted. There is borderline dilatation of the aortic root, measuring 37 mm. There is borderline dilatation of the ascending aorta, measuring 36 mm.  6. The inferior vena cava is normal in size with <50% respiratory variability, suggesting right atrial pressure of 8 mmHg. Comparison(s): Compared to prior TTE report in 2018, there is now moderate MR (previously no MR) and the inferior wall is hypokinetic. FINDINGS  Left  Ventricle: Left ventricular ejection fraction, by estimation, is 55 to 60%. Left ventricular ejection fraction by 3D volume is 55 %. The  left ventricle has normal function. The left ventricle demonstrates regional wall motion abnormalities. The mid-apical inferior wall appears hypokinetic. 3D left ventricular ejection fraction analysis performed but not reported based on interpreter judgement due to suboptimal tracking. The left ventricular internal cavity size was normal in size. There is no left ventricular hypertrophy. Left ventricular diastolic parameters were normal. Right Ventricle: The right ventricular size is normal. No increase in right ventricular wall thickness. Right ventricular systolic function is normal. There is normal pulmonary artery systolic pressure. The tricuspid regurgitant velocity is 2.28 m/s, and  with an assumed right atrial pressure of 8 mmHg, the estimated right ventricular systolic pressure is 83.4 mmHg. Left Atrium: Left atrial size was normal in size. Right Atrium: Right atrial size was normal in size. Pericardium: There is no evidence of pericardial effusion. Mitral Valve: The mitral valve is grossly normal. Moderate mitral valve regurgitation. Tricuspid Valve: The tricuspid valve is normal in structure. Tricuspid valve regurgitation is trivial. Aortic Valve: The aortic valve is tricuspid. Aortic valve regurgitation is trivial. Aortic regurgitation PHT measures 542 msec. Aortic valve sclerosis is present, with no evidence of aortic valve stenosis. Pulmonic Valve: The pulmonic valve was normal in structure. Pulmonic valve regurgitation is trivial. Aorta: Aortic dilatation noted. There is borderline dilatation of the aortic root, measuring 37 mm. There is borderline dilatation of the ascending aorta, measuring 36 mm. Venous: The inferior vena cava is normal in size with less than 50% respiratory variability, suggesting right atrial pressure of 8 mmHg. IAS/Shunts: The atrial septum is grossly normal.  LEFT VENTRICLE PLAX 2D LVIDd:         5.40 cm         Diastology LVIDs:         3.80 cm         LV e' medial:    7.51  cm/s LV PW:         1.10 cm         LV E/e' medial:  12.6 LV IVS:        1.00 cm         LV e' lateral:   12.30 cm/s LVOT diam:     2.00 cm         LV E/e' lateral: 7.7 LV SV:         86 LV SV Index:   42              2D LVOT Area:     3.14 cm        Longitudinal                                Strain                                2D Strain GLS  -19.3 %                                Avg:                                 3D Volume EF  LV 3D EF:    Left                                             ventricul                                             ar                                             ejection                                             fraction                                             by 3D                                             volume is                                             55 %.                                 3D Volume EF:                                3D EF:        55 %                                LV EDV:       174 ml                                LV ESV:       79 ml                                LV SV:        96 ml RIGHT VENTRICLE             IVC RV S prime:     14.50 cm/s  IVC diam: 1.60 cm TAPSE (M-mode): 2.1 cm RVSP:           23.8 mmHg LEFT ATRIUM             Index        RIGHT ATRIUM  Index LA diam:        4.50 cm 2.19 cm/m   RA Pressure: 3.00 mmHg LA Vol (A2C):   57.9 ml 28.16 ml/m  RA Area:     14.80 cm LA Vol (A4C):   58.0 ml 28.20 ml/m  RA Volume:   38.50 ml  18.72 ml/m LA Biplane Vol: 61.2 ml 29.76 ml/m  AORTIC VALVE LVOT Vmax:   117.00 cm/s LVOT Vmean:  78.100 cm/s LVOT VTI:    0.273 m AI PHT:      542 msec  AORTA Ao Root diam: 3.70 cm Ao Asc diam:  3.60 cm MITRAL VALVE               TRICUSPID VALVE MV Area (PHT): 2.85 cm    TR Peak grad:   20.8 mmHg MV Decel Time: 266 msec    TR Vmax:        228.00 cm/s MV E velocity: 94.90 cm/s  Estimated RAP:  3.00 mmHg MV A velocity: 83.30 cm/s  RVSP:           23.8 mmHg MV E/A ratio:   1.14                            SHUNTS                            Systemic VTI:  0.27 m                            Systemic Diam: 2.00 cm Gwyndolyn Kaufman MD Electronically signed by Gwyndolyn Kaufman MD Signature Date/Time: 02/07/2022/4:28:42 PM    Final    CARDIAC CATHETERIZATION  Result Date: 02/03/2022 Conclusions: Severe three-vessel coronary artery disease, including long 95% mid LAD stenosis with competitive flow in the distal vessel from right-to-left collaterals, 70% D2 stenosis, chronic total occlusion of mid LCx with left-to-left collaterals, 50% OM1 stensosis, and sequential 90% proximal and 80-90% mid RCA lesions with heavy calcification.  50% ostial rPDA stenosis also present with competitive flow in the distal rPDA from left-to-right collaterals. Mildly-moderately reduced left ventricular systolic function with mid and apical inferior hypokinesis (LVEF ~45%). Upper normal left ventricular filling pressure (LVEDP 15 mmHg). Recommendations: Case discussed with Mr. Voris and his family, as well as Dr. Marlou Porch; we will arrange for expedited outpatient cardiac surgery consultation. Proceed with echocardiogram as scheduled for next week. Add isosorbide mononitrate 30 mg daily for antianginal therapy. Restart apixaban tomorrow morning if no evidence of bleeding/vascular injury at right radial catheterization site. Aggressive secondary prevention of coronary artery disease. [name], MD [facility]  CT CORONARY [facility] W/CTA COR W/SCORE Lewanda Rife W/CM &/OR WO/CM  Addendum Date: 01/24/2022   ADDENDUM REPORT: 01/24/2022 15:51 EXAM: OVER-READ INTERPRETATION  CT CHEST The following report is an over-read performed by radiologist Dr. West Bali [facility] on 01/24/2022. This over-read does not include interpretation of cardiac or coronary anatomy or pathology. The coronary CTA interpretation by the cardiologist is attached. COMPARISON:  CT chest 12/10/2020 FINDINGS: Aorta normal caliber. No  pericardial effusion. Visualized pulmonary arteries patent. Esophagus unremarkable. No adenopathy. Visualized upper abdomen normal appearance. Minimal dependent atelectasis in the posterior lower lobes. Remaining lungs clear. No pleural effusion or pneumothorax. Osseous structures unremarkable. IMPRESSION: No significant noncardiac abnormalities. Electronically Signed   By: Lavonia Dana M.D.   On: 01/24/2022 15:51  Result Date: 01/24/2022 CLINICAL DATA:  76 Year old White Male EXAM: Cardiac/Coronary  CTA TECHNIQUE: The patient was scanned on a [employer]. FINDINGS: Scan was triggered in the descending thoracic aorta. Axial non-contrast 3 mm slices were carried out through the heart. The data set was analyzed on a dedicated work station and scored using the [place]. Gantry rotation speed was 250 msecs and collimation was .6 mm. 0.8 mg of sl NTG was given. The 3D data set was reconstructed in 5% intervals of the 67-82 % of the R-R cycle. Diastolic phases were analyzed on a dedicated work station using MPR, MIP and VRT modes. The patient received 100 cc of contrast. Coronary Arteries:  Normal coronary origin.  Right dominance. Coronary Calcium Score: Left main: 172 Left anterior descending artery: 1194 Left circumflex artery: 1300 Right coronary artery: 2261 Total: 4927 Percentile: 96th for age, sex, and race matched control. RCA is a large dominant artery that gives rise to PDA and PLA. RCA is diffusely diseased. Severe calcified plaque (70-99%) with moderate and mild calcified plaques scatter through the R-PDA, mid RCA and Mid LAD. Left main is a large artery that gives rise to LAD and LCX arteries. Mild (25-49%) calcified plaque in the body of the left main. LAD is a large vessel that gives rise to one large D1 Branch that bifurcates twice. LAD is diffusely diseased. Mild calcified plaque (29-49%) at the proximal LAD. There is a large (29 mm length) severe (70-99%) calcified plaque in the mid  LAD just after the D1 take off, followed by moderate mixed plaque stenosis later in the mid LAD. Severe mixed plaque stenosis (70-99%) of D1 after its first small bifurcation. LCX is a non-dominant artery that gives rise to one large OM1 branch. There is severe mixed plaque stenosis (70-99%) of both the OM1 vessel and the mid LCX after the OM take off. Other findings: Aorta: Normal size.  Aortic atherosclerosis.  No dissection. Main Pulmonary Artery: Normal size of the pulmonary artery. Aortic Valve:  Tri-leaflet.  No calcifications. Normal pulmonary vein drainage into the left atrium. Normal left atrial appendage without a thrombus. Interatrial septum with no communications. Extra-cardiac findings: See attached radiology report for non-cardiac structures. IMPRESSION: 1. Coronary calcium score of 4927. This was 96th percentile for age, sex, and race matched control. 2. Normal coronary origin with right dominance. 3. CAD-RADS 4 Severe stenosis multi-vessel disease. (70-99%). CT FFR will be sent. Consider symptom-guided anti-ischemic pharmacotherapy as well as risk factor modification per guideline directed care. Thi RECOMMENDATIONS: Coronary artery calcium (CAC) score is a strong predictor of incident coronary heart disease (CHD) and provides predictive information beyond traditional risk factors. CAC scoring is reasonable to use in the decision to withhold, postpone, or initiate statin therapy in intermediate-risk or selected borderline-risk asymptomatic adults (age 39-75 years and LDL-C >=70 to <190 mg/dL) who do not have diabetes or established atherosclerotic cardiovascular disease (ASCVD).* In intermediate-risk (10-year ASCVD risk >=7.5% to <20%) adults or selected borderline-risk (10-year ASCVD risk >=5% to <7.5%) adults in whom a CAC score is measured for the purpose of making a treatment decision the following recommendations have been made: If CAC = 0, it is reasonable to withhold statin therapy and reassess  in 5 to 10 years, as long as higher risk conditions are absent (diabetes mellitus, family history of premature CHD in first degree relatives (males <55 years; females <65 years), cigarette smoking, LDL >=190 mg/dL or other independent risk factors). If CAC is 1 to  99, it is reasonable to initiate statin therapy for patients >=94 years of age. If CAC is >=100 or >=75th percentile, it is reasonable to initiate statin therapy at any age. Cardiology referral should be considered for patients with CAC scores =400 or >=75th percentile. *2018 AHA/ACC/AACVPR/AAPA/ABC/ACPM/ADA/AGS/APhA/ASPC/NLA/PCNA Guideline on the Management of Blood Cholesterol: A Report of the American College of Cardiology/American Heart Association Task Force on Clinical Practice Guidelines. J Am Coll Cardiol. 2019;73(24):3168-3209. Rudean Haskell, MD Electronically Signed: By: Rudean Haskell M.D. On: 01/24/2022 15:35   CT CORONARY FRACTIONAL FLOW RESERVE DATA PREP  Result Date: 01/24/2022 EXAM: CT-FFR ANALYSIS CLINICAL DATA:  Possibly obstructive coronary lesion: Proximal RCA, mid LAD, mid LCX, OM1, D1 FINDINGS: CT-FFR analysis was performed on the original cardiac CT angiogram dataset. Diagrammatic representation of the CT-FFR analysis is provided in a separate PDF document in PACS. This dictation was created using the PDF document and an interactive 3D model of the results. 3D model is not available in the EMR/PACS. Normal FFR range is >0.80. 1. Left Main: No significant functional stenosis, CT-FFR 0.99. 2. LAD: significant functional stenosis, CT-FFR 0.7 at mid LAD. D1 vessel not modeled 3. LCX: Unclear significant functional stenosis, CT-FFR not modeled at OM1 or mid LCX lesions. 4. RCA: significant functional stenosis, CT-FFR 0.68 proximal RCA. IMPRESSION: 1. CT FFR analysis shows evidence of significant functional stenosis. This presentation is consistent with multi-vessel disease. Rudean Haskell MD Electronically Signed    By: Rudean Haskell M.D.   On: 01/24/2022 15:41   CT CORONARY FRACTIONAL FLOW RESERVE FLUID ANALYSIS  Result Date: 01/24/2022 EXAM: CT-FFR ANALYSIS CLINICAL DATA:  Possibly obstructive coronary lesion: Proximal RCA, mid LAD, mid LCX, OM1, D1 FINDINGS: CT-FFR analysis was performed on the original cardiac CT angiogram dataset. Diagrammatic representation of the CT-FFR analysis is provided in a separate PDF document in PACS. This dictation was created using the PDF document and an interactive 3D model of the results. 3D model is not available in the EMR/PACS. Normal FFR range is >0.80. 1. Left Main: No significant functional stenosis, CT-FFR 0.99. 2. LAD: significant functional stenosis, CT-FFR 0.7 at mid LAD. D1 vessel not modeled 3. LCX: Unclear significant functional stenosis, CT-FFR not modeled at OM1 or mid LCX lesions. 4. RCA: significant functional stenosis, CT-FFR 0.68 proximal RCA. IMPRESSION: 1. CT FFR analysis shows evidence of significant functional stenosis. This presentation is consistent with multi-vessel disease. Rudean Haskell MD Electronically Signed   By: Rudean Haskell M.D.   On: 01/24/2022 15:41     Treatments: surgery:                  02/16/2022 Patient:  Orlie Pollen Pre-Op Dx: Three-vessel coronary artery disease                         Diabetes mellitus                         Hypertension                         Hyperlipidemia                         History of pulmonary embolisms Post-op Dx: Same Procedure: CABG X 4.  LIMA to LAD, reverse saphenous vein graft to PDA, obtuse marginal, diagonal branch(jumped off the hood of the obtuse marginal vein graft) Endoscopic  greater saphenous vein harvest on the right     Surgeon and Role:      * Lightfoot, Lucile Crater, MD - Primary    * B. Stehler , PA-C - assisting An experienced assistant was required given the complexity of this surgery and the standard of surgical care. The assistant was needed for  exposure, dissection, suctioning, retraction of delicate tissues and sutures, instrument exchange and for overall help during this procedure.      Discharge Exam: Blood pressure (!) 126/58, pulse 74, temperature 98.1 F (36.7 C), temperature source Oral, resp. rate 17, height 6' (1.829 m), weight 82.3 kg, SpO2 97 %.   General appearance: alert, cooperative, and no distress Heart: regular rate and rhythm Lungs: clear to auscultation bilaterally Abdomen: benign Extremities: minor edema Wound: incis healing well, some right thigh echymosis Discharge Medications:  The patient has been discharged on:   1.Beta Blocker:  Yes [ y  ]                              No   [   ]                              If No, reason:  2.Ace Inhibitor/ARB: Yes [   y]                                     No  [    ]                                     If No, reason:  3.Statin:   Yes [ y  ]                  No  [   ]                  If No, reason:  4.Ecasa:  Yes  Blue.Reese   ]                  No   [   ]                  If No, reason:  Patient had ACS upon admission:  Plavix/P2Y12 inhibitor: Yes [   ]                                      No  [  n ]     Discharge Instructions     Amb Referral to Cardiac Rehabilitation   Complete by: As directed    Diagnosis: CABG   CABG X ___: 4 Comment - 02/16/2022   After initial evaluation and assessments completed: Virtual Based Care may be provided alone or in conjunction with Phase 2 Cardiac Rehab based on patient barriers.: Yes   Intensive Cardiac Rehabilitation (ICR) [place] location only OR Traditional Cardiac Rehabilitation (TCR) *If criteria for ICR are not met will enroll in TCR [facility] only): Yes   Discharge patient   Complete by: As directed    Discharge disposition: 01-Home or Self Care   Discharge patient date: 02/21/2022  Allergies as of 02/21/2022       Reactions   Penicillins Rash   Has patient had a PCN reaction causing immediate rash,  facial/tongue/throat swelling, SOB or lightheadedness with hypotension: No Has patient had a PCN reaction causing severe rash involving mucus membranes or skin necrosis: No Has patient had a PCN reaction that required hospitalization No Has patient had a PCN reaction occurring within the last 10 years: No If all of the above answers are "NO", then may proceed with Cephalosporin use.        Medication List     STOP taking these medications    Fluad 0.5 ML Susy Generic drug: Influenza Vac A&B Surf Ant Adj   isosorbide mononitrate 30 MG 24 hr tablet Commonly known as: IMDUR       TAKE these medications    acetaminophen 500 MG tablet Commonly known as: TYLENOL Take 1-2 tablets (500-1,000 mg total) by mouth every 6 (six) hours as needed.   aspirin EC 81 MG tablet Take 1 tablet (81 mg total) by mouth daily. Swallow whole.   B-D UF III MINI PEN NEEDLES 31G X 5 MM Misc Generic drug: Insulin Pen Needle USE ONCE A DAY INJECTION 90 DAYS   Basaglar KwikPen 100 UNIT/ML Inject 35 Units into the skin at bedtime.   desonide 0.05 % cream Commonly known as: DESOWEN Apply 1 application topically daily as needed (eczema).   Eliquis 5 MG Tabs tablet Generic drug: apixaban Take 5 mg by mouth 2 (two) times daily.   furosemide 40 MG tablet Commonly known as: LASIX Take 1 tablet (40 mg total) by mouth daily.   Jardiance 25 MG Tabs tablet Generic drug: empagliflozin Take 25 mg by mouth daily.   losartan 25 MG tablet Commonly known as: COZAAR Take 25 mg by mouth daily.   metFORMIN 500 MG 24 hr tablet Commonly known as: GLUCOPHAGE-XR Take 1 tablet (500 mg total) by mouth 2 (two) times daily.   metoprolol tartrate 25 MG tablet Commonly known as: LOPRESSOR Take 1 tablet (25 mg total) by mouth 2 (two) times daily.   nitroGLYCERIN 0.4 MG SL tablet Commonly known as: NITROSTAT Place 1 tablet (0.4 mg total) under the tongue every 5 (five) minutes as needed for chest pain.    oxyCODONE 5 MG immediate release tablet Commonly known as: Oxy IR/ROXICODONE Take 1 tablet (5 mg total) by mouth every 6 (six) hours as needed for severe pain.   potassium chloride SA 20 MEQ tablet Commonly known as: KLOR-CON M Take 1 tablet (20 mEq total) by mouth daily.   rosuvastatin 20 MG tablet Commonly known as: CRESTOR Take 1 tablet (20 mg total) by mouth daily.        Follow-up Information     Lajuana Matte, MD Follow up on 02/25/2022.   Specialty: Cardiothoracic Surgery Why: Your apppontment is at 2pm.   Your first appointment will a telephone call so do not come to the office.  Subsequent visits will be in the office. Contact information: [place] [address] [phone]         Imogene Burn, PA-C. Go on 03/09/2022.   Specialty: Cardiology Why: Your appointment is at 9:45am. Contact information: [address] [place] [address] [place], [facility] Follow up.   Why: ([employer])- rolling walker arranged- to be delivered to room prior to discharge Contact information: [place]  [place] [address] [phone]                 Signed:  John Giovanni, PA-C  02/22/2022, 12:31 PM
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
## 2022-02-17 NOTE — Progress Notes (Addendum)
T[facility] DAILY ICU PROGRESS NOTE                   [address]            [place] [address]          [phone]   1 Day Post-Op Procedure(s) (LRB): CORONARY ARTERY BYPASS GRAFTING (CABG), ON PUMP, TIMES FOUR, USING LEFT INTERNAL MAMMARY ARTERY AND RIGHT ENDOSCOPICALLY HARVESTED GREATER SAPHENOUS VEIN (N/A) TRANSESOPHAGEAL ECHOCARDIOGRAM (TEE) (N/A)  Total Length of Stay:  LOS: 1 day   Subjective: Extubated last evening. Awake and alert, sitting up in the bedside chair eating breakfast. No complaints or concerns.  Denies pain or nausea.  On no vasopressors and currently on room air with good sats.  Objective: Vital signs in last 24 hours: Temp:  [95.9 F (35.5 C)-99.7 F (37.6 C)] 99.3 F (37.4 C) (09/21 0815) Pulse Rate:  [61-97] 68 (09/21 0815) Cardiac Rhythm: Normal sinus rhythm (09/21 0400) Resp:  [0-25] 23 (09/21 0815) BP: (91-147)/(54-97) 106/92 (09/21 0815) SpO2:  [91 %-100 %] 99 % (09/21 0815) Arterial Line BP: (98-327)/(41-322) 148/54 (09/21 0815) FiO2 (%):  [40 %-50 %] 40 % (09/20 1745) Weight:  [82.7 kg] 82.7 kg (09/21 0600)  Filed Weights   02/16/22 0653 02/17/22 0600  Weight: 83 kg 82.7 kg    Weight change: -0.308 kg   Hemodynamic parameters for last 24 hours: CVP:  [1 mmHg-71 mmHg] 8 mmHg  Intake/Output from previous day: 09/20 0701 - 09/21 0700 In: 7007.9 [P.O.:390; I.V.:4723.6; Blood:350; IV Piggyback:1544.3] Out: 4540 [Urine:3440; Blood:650; Chest Tube:450]  Intake/Output this shift: Total I/O In: 58 [I.V.:58] Out: 200 [Urine:150; Chest Tube:50]  Current Meds: Scheduled Meds:  acetaminophen  1,000 mg Oral Q6H   Or   acetaminophen (TYLENOL) oral liquid 160 mg/5 mL  1,000 mg Per Tube Q6H   aspirin EC  325 mg Oral Daily   Or   aspirin  324 mg Per Tube Daily   bisacodyl  10 mg Oral Daily   Or   bisacodyl  10 mg Rectal Daily   Chlorhexidine Gluconate Cloth  6 each Topical Daily   docusate sodium  200 mg Oral Daily    metoprolol tartrate  12.5 mg Oral BID   Or   metoprolol tartrate  12.5 mg Per Tube BID   [START ON 02/18/2022] pantoprazole  40 mg Oral Daily   sodium chloride flush  3 mL Intravenous Q12H   Continuous Infusions:  sodium chloride 20 mL/hr at 02/17/22 0757   sodium chloride     albumin human      ceFAZolin (ANCEF) IV Stopped (02/17/22 0533)   dexmedetomidine (PRECEDEX) IV infusion Stopped (02/16/22 1741)   DOBUTamine Stopped (02/16/22 1610)   insulin 1.3 Units/hr (02/17/22 0757)   lactated ringers     lactated ringers 20 mL/hr at 02/17/22 0757   lactated ringers 20 mL/hr at 02/17/22 0757   niCARDipine Stopped (02/16/22 2213)   norepinephrine (LEVOPHED) Adult infusion Stopped (02/16/22 1418)   phenylephrine (NEO-SYNEPHRINE) Adult infusion Stopped (02/16/22 1514)   PRN Meds:.sodium chloride, albumin human, dextrose, lactated ringers, metoprolol tartrate, midazolam, morphine injection, ondansetron (ZOFRAN) IV, oxyCODONE, sodium chloride flush, traMADol  General appearance: alert, cooperative, and no distress Neurologic: intact Heart: Regular rate and rhythm.  Monitor shows normal sinus rhythm in the high 60s.  No pacing required overnight. Lungs: Breath sounds are clear.  Chest x-ray is showing good aeration bilaterally, small left basilar effusion/atelectasis. Abdomen: Firm, nontender Extremities: All well perfused.  SCDs were applied to the lower extremities.  The right lower extremity EVH incision is covered with a dry dressing. Wound: Sternotomy incision is covered with a dry Aquacel dressing.  Lab Results: CBC: Recent Labs    02/16/22 2000 02/16/22 2002 02/17/22 0456  WBC 8.0  --  7.8  HGB 10.3* 9.5* 9.7*  HCT 30.0* 28.0* 29.3*  PLT 104*  --  106*   BMET:  Recent Labs    02/16/22 2000 02/16/22 2002 02/17/22 0456  NA 138 138 136  K 5.5* 5.5* 4.4  CL 111  --  109  CO2 17*  --  19*  GLUCOSE 109*  --  130*  BUN 16  --  15  CREATININE 0.96  --  1.11  CALCIUM 8.3*   --  8.6*    CMET: Lab Results  Component Value Date   WBC 7.8 02/17/2022   HGB 9.7 (L) 02/17/2022   HCT 29.3 (L) 02/17/2022   PLT 106 (L) 02/17/2022   GLUCOSE 130 (H) 02/17/2022   ALT 20 02/14/2022   AST 19 02/14/2022   NA 136 02/17/2022   K 4.4 02/17/2022   CL 109 02/17/2022   CREATININE 1.11 02/17/2022   BUN 15 02/17/2022   CO2 19 (L) 02/17/2022   INR 1.4 (H) 02/16/2022   HGBA1C 6.9 (H) 02/14/2022      PT/INR:  Recent Labs    02/16/22 1450  LABPROT 17.1*  INR 1.4*   Radiology: [facility] Chest Port 1 View  Result Date: 02/17/2022 CLINICAL DATA:  Follow-up post CABG.  Brady  [id number]. EXAM: PORTABLE CHEST 1 VIEW COMPARISON:  Portable chest yesterday are 4:01 p.m. FINDINGS: 5:35 a.m. interval extubation and removal NGT. Right IJ catheter through an introducer sheath terminates in the distal SVC as before. There is a left chest tube and mediastinal drain remaining in place. There is no visible pneumothorax. There is a small left pleural effusion and patchy consolidation or atelectasis in the left base appears similar with no new, further or worsening lung opacity. There is stable mediastinal configuration with aortic atherosclerosis. There is mild cardiomegaly with normal caliber of the central vessels and no pulmonary edema findings. IMPRESSION: Overall aeration is not significantly changed following extubation. Stable appearance of small left pleural effusion and overlying basilar lung opacities. Electronically Signed   By: Telford Nab M.D.   On: 02/17/2022 07:51   DG Chest Port 1 View  Result Date: 02/16/2022 CLINICAL DATA:  Status post coronary bypass graft. EXAM: PORTABLE CHEST 1 VIEW COMPARISON:  February 14, 2022. FINDINGS: Endotracheal and nasogastric tubes are in grossly good position. Right internal jugular catheter is noted with distal tip in expected position of the SVC. Left-sided chest tube is noted without pneumothorax. Mild bibasilar subsegmental atelectasis is noted.  Small left pleural effusion is noted. IMPRESSION: Endotracheal and nasogastric tubes are in grossly good position. Left-sided chest tube without pneumothorax. Mild bibasilar atelectasis is noted. Small left pleural effusion is noted. Electronically Signed   By: Marijo Conception M.D.   On: 02/16/2022 16:21     Assessment/Plan: S/P Procedure(s) (LRB): CORONARY ARTERY BYPASS GRAFTING (CABG), ON PUMP, TIMES FOUR, USING LEFT INTERNAL MAMMARY ARTERY AND RIGHT ENDOSCOPICALLY HARVESTED GREATER SAPHENOUS VEIN (N/A) TRANSESOPHAGEAL ECHOCARDIOGRAM (TEE) (N/A)  -Postop day 1 CABG x4 after presenting with angina and preserved LV function.  Stable hemodynamics.  Not requiring any vasopressor support.  We will DC hemodynamic monitoring.  Leave the chest tubes for drainage.  Mobilize.  Begin aspirin, statin, beta-blocker.Marland Kitchen  -  Pulm-good oxygenation on room air.  Work on pulmonary hygiene with ambulation and incentive spirometry.  -Heme-mild expected acute blood loss anemia.  Monitor.  Has history of DVT with pulmonary emboli.  To resume apixaban today.  -Endo-type 2 diabetes mellitus, on glargine insulin and metformin at home prior to admission.  Glucose control currently with an insulin drip.  We will transition to long-acting and sliding scale insulins today.  -Renal-normal renal function at baseline.  Good urine output.  -GI-abdomen is firm but nontender and seems to have reasonable appetite.   -DVT prophylaxis-ambulate, SCDs.  To resume apixaban today   Antony Odea, PA-C [phone] 02/17/2022 8:36 AM  Agree with above POD1 progression Will start eliquis after wire removal  Duaine Radin O Gustavia Carie

## 2022-02-18 ENCOUNTER — Inpatient Hospital Stay (HOSPITAL_COMMUNITY): Payer: Medicare Other

## 2022-02-18 LAB — BASIC METABOLIC PANEL
Anion gap: 5 (ref 5–15)
BUN: 19 mg/dL (ref 8–23)
CO2: 23 mmol/L (ref 22–32)
Calcium: 8.6 mg/dL — ABNORMAL LOW (ref 8.9–10.3)
Chloride: 105 mmol/L (ref 98–111)
Creatinine, Ser: 0.99 mg/dL (ref 0.61–1.24)
GFR, Estimated: >60 mL/min (ref >60)
Glucose, Bld: 140 mg/dL — ABNORMAL HIGH (ref 70–99)
Potassium: 3.8 mmol/L (ref 3.5–5.1)
Sodium: 133 mmol/L — ABNORMAL LOW (ref 135–145)

## 2022-02-18 LAB — ECHO INTRAOPERATIVE TEE
Height: 72 in
MV M vel: 5.22 m/s
MV Peak grad: 109 mmHg
Radius: 0.4 cm
Weight: 2927.99 oz

## 2022-02-18 LAB — GLUCOSE, CAPILLARY
Glucose-Capillary: 148 mg/dL — ABNORMAL HIGH (ref 70–99)
Glucose-Capillary: 216 mg/dL — ABNORMAL HIGH (ref 70–99)
Glucose-Capillary: 177 mg/dL — ABNORMAL HIGH (ref 70–99)
Glucose-Capillary: 168 mg/dL — ABNORMAL HIGH (ref 70–99)
Glucose-Capillary: 193 mg/dL — ABNORMAL HIGH (ref 70–99)

## 2022-02-18 LAB — CBC
HCT: 28.2 % — ABNORMAL LOW (ref 39.0–52.0)
Hemoglobin: 9.4 g/dL — ABNORMAL LOW (ref 13.0–17.0)
MCH: 31.6 pg (ref 26.0–34.0)
MCHC: 33.3 g/dL (ref 30.0–36.0)
MCV: 94.9 fL (ref 80.0–100.0)
Platelets: 118 10*3/uL — ABNORMAL LOW (ref 150–400)
RBC: 2.97 MIL/uL — ABNORMAL LOW (ref 4.22–5.81)
RDW: 14.5 % (ref 11.5–15.5)
WBC: 10 10*3/uL (ref 4.0–10.5)
nRBC: 0 % (ref 0.0–0.2)

## 2022-02-18 MED ORDER — METOPROLOL TARTRATE 25 MG PO TABS
25.0000 mg | ORAL_TABLET | Freq: Two times a day (BID) | ORAL | Status: DC
  Administered 2022-02-18 – 2022-02-21 (×7): 25 mg via ORAL
  Filled 2022-02-18 (×7): qty 1

## 2022-02-18 MED ORDER — INSULIN DETEMIR 100 UNIT/ML ~~LOC~~ SOLN
10.0000 [IU] | Freq: Every day | SUBCUTANEOUS | Status: DC
  Administered 2022-02-18: 10 [IU] via SUBCUTANEOUS
  Filled 2022-02-18 (×2): qty 0.1

## 2022-02-18 MED ORDER — ~~LOC~~ CARDIAC SURGERY, PATIENT & FAMILY EDUCATION
Freq: Once | Status: AC
  Administered 2022-02-18: 1

## 2022-02-18 MED ORDER — SODIUM CHLORIDE 0.9 % IV SOLN: 250.0000 mL | INTRAVENOUS | Status: DC | PRN

## 2022-02-18 MED ORDER — SODIUM CHLORIDE 0.9% FLUSH: 3.0000 mL | INTRAVENOUS | Status: DC | PRN

## 2022-02-18 MED ORDER — SODIUM CHLORIDE 0.9% FLUSH
3.0000 mL | Freq: Two times a day (BID) | INTRAVENOUS | Status: DC
  Administered 2022-02-18: 3 mL via INTRAVENOUS

## 2022-02-18 MED ORDER — INSULIN ASPART 100 UNIT/ML IJ SOLN: 0.0000 [IU] | INTRAMUSCULAR | Status: DC

## 2022-02-18 MED ORDER — FUROSEMIDE 40 MG PO TABS
40.0000 mg | ORAL_TABLET | Freq: Every day | ORAL | Status: DC
  Administered 2022-02-18 – 2022-02-21 (×4): 40 mg via ORAL
  Filled 2022-02-18 (×4): qty 1

## 2022-02-18 MED ORDER — POTASSIUM CHLORIDE CRYS ER 20 MEQ PO TBCR
40.0000 meq | EXTENDED_RELEASE_TABLET | Freq: Once | ORAL | Status: AC
  Administered 2022-02-18: 40 meq via ORAL
  Filled 2022-02-18: qty 2

## 2022-02-18 MED ORDER — INSULIN ASPART 100 UNIT/ML IJ SOLN
0.0000 [IU] | Freq: Three times a day (TID) | INTRAMUSCULAR | Status: DC
  Administered 2022-02-18: 4 [IU] via SUBCUTANEOUS
  Administered 2022-02-19 – 2022-02-20 (×3): 8 [IU] via SUBCUTANEOUS
  Administered 2022-02-20 (×2): 4 [IU] via SUBCUTANEOUS
  Administered 2022-02-21: 2 [IU] via SUBCUTANEOUS

## 2022-02-18 MED FILL — Heparin Sodium (Porcine) Inj 1000 Unit/ML: INTRAMUSCULAR | Qty: 20 | Status: AC

## 2022-02-18 MED FILL — Electrolyte-R (PH 7.4) Solution: INTRAVENOUS | Qty: 3000 | Status: AC

## 2022-02-18 MED FILL — Mannitol IV Soln 20%: INTRAVENOUS | Qty: 500 | Status: AC

## 2022-02-18 MED FILL — Calcium Chloride Inj 10%: INTRAVENOUS | Qty: 10 | Status: AC

## 2022-02-18 MED FILL — Sodium Chloride IV Soln 0.9%: INTRAVENOUS | Qty: 2000 | Status: AC

## 2022-02-18 MED FILL — Sodium Bicarbonate IV Soln 8.4%: INTRAVENOUS | Qty: 50 | Status: AC

## 2022-02-18 NOTE — Progress Notes (Signed)
      [address]       [place] [address]             [phone]                 2 Days Post-Op Procedure(s) (LRB): CORONARY ARTERY BYPASS GRAFTING (CABG), ON PUMP, TIMES FOUR, USING LEFT INTERNAL MAMMARY ARTERY AND RIGHT ENDOSCOPICALLY HARVESTED GREATER SAPHENOUS VEIN (N/A) TRANSESOPHAGEAL ECHOCARDIOGRAM (TEE) (N/A)   Events: No events  _______________________________________________________________ Vitals: BP 124/73   Pulse 81   Temp 97.6 F (36.4 C) (Oral)   Resp (!) 21   Ht 6' (1.829 m)   Wt 87 kg   SpO2 98%   BMI 26.01 kg/m  Filed Weights   02/16/22 0653 02/17/22 0600 02/18/22 0500  Weight: 83 kg 82.7 kg 87 kg     - Neuro: alert NAD  - Cardiovascular: sinus  Drips: none     - Pulm: EWOB    ABG    Component Value Date/Time   PHART 7.353 02/16/2022 2002   PCO2ART 32.9 02/16/2022 2002   PO2ART 83 02/16/2022 2002   HCO3 18.4 (L) 02/16/2022 2002   TCO2 19 (L) 02/16/2022 2002   ACIDBASEDEF 7.0 (H) 02/16/2022 2002   O2SAT 96 02/16/2022 2002    - Abd: ND - Extremity: warm  .Intake/Output      09/21 0701 09/22 0700 09/22 0701 09/23 0700   P.O. 500    I.V. (mL/kg) 408.6 (4.7)    Blood     IV Piggyback 299.9    Total Intake(mL/kg) 1208.5 (13.9)    Urine (mL/kg/hr) 3125 (1.5)    Blood     Chest Tube 230    Total Output 3355    Net -2146.5            _______________________________________________________________ Labs:    Latest Ref Rng & Units 02/18/2022    3:19 AM 02/17/2022    4:25 PM 02/17/2022    4:56 AM  CBC  WBC 4.0 - 10.5 K/uL 10.0  10.1  7.8   Hemoglobin 13.0 - 17.0 g/dL 9.4  9.7  9.7   Hematocrit 39.0 - 52.0 % 28.2  29.5  29.3   Platelets 150 - 400 K/uL 118  125  106       Latest Ref Rng & Units 02/18/2022    3:19 AM 02/17/2022    4:25 PM 02/17/2022    4:56 AM  CMP  Glucose 70 - 99 mg/dL 140  264  130   BUN 8 - 23 mg/dL '19  19  15   '$ Creatinine 0.61 - 1.24 mg/dL 0.99  1.22  1.11   Sodium 135 - 145 mmol/L  133  130  136   Potassium 3.5 - 5.1 mmol/L 3.8  4.3  4.4   Chloride 98 - 111 mmol/L 105  105  109   CO2 22 - 32 mmol/L '23  20  19   '$ Calcium 8.9 - 10.3 mg/dL 8.6  8.4  8.6     CXR: stable  _______________________________________________________________  Assessment and Plan: POD 2 s/p CABG  Neuro: pain controlled CV: on A/S/BB, and eliquis for hx of PE.  Will increase BB Pulm: will remove chest tubes Renal: stable, diuresis today GI: on diet Heme: stable ID: afebrile Endo: SSI.  Adding levemir Dispo: floor   Lajuana Matte 02/18/2022 8:53 AM

## 2022-02-18 NOTE — Discharge Instructions (Signed)

## 2022-02-18 NOTE — Progress Notes (Signed)
Pt declined ambulation due to being tired and walking prior to arrival. Spent time introducing role, reviewing restrictions, and sternal precautions. Pt was told to walk x3/day with mobility and CRPI. All questions answered priro to departure.   [phone]  Christen Bame 02/18/2022 2:18 PM

## 2022-02-19 LAB — BASIC METABOLIC PANEL
Anion gap: 9 (ref 5–15)
BUN: 19 mg/dL (ref 8–23)
CO2: 24 mmol/L (ref 22–32)
Calcium: 9 mg/dL (ref 8.9–10.3)
Chloride: 101 mmol/L (ref 98–111)
Creatinine, Ser: 1.13 mg/dL (ref 0.61–1.24)
GFR, Estimated: >60 mL/min (ref >60)
Glucose, Bld: 170 mg/dL — ABNORMAL HIGH (ref 70–99)
Potassium: 4.4 mmol/L (ref 3.5–5.1)
Sodium: 134 mmol/L — ABNORMAL LOW (ref 135–145)

## 2022-02-19 LAB — CBC
HCT: 26.1 % — ABNORMAL LOW (ref 39.0–52.0)
Hemoglobin: 9 g/dL — ABNORMAL LOW (ref 13.0–17.0)
MCH: 31.8 pg (ref 26.0–34.0)
MCHC: 34.5 g/dL (ref 30.0–36.0)
MCV: 92.2 fL (ref 80.0–100.0)
Platelets: 106 10*3/uL — ABNORMAL LOW (ref 150–400)
RBC: 2.83 MIL/uL — ABNORMAL LOW (ref 4.22–5.81)
RDW: 14.4 % (ref 11.5–15.5)
WBC: 10.1 10*3/uL (ref 4.0–10.5)
nRBC: 0 % (ref 0.0–0.2)

## 2022-02-19 LAB — GLUCOSE, CAPILLARY
Glucose-Capillary: 144 mg/dL — ABNORMAL HIGH (ref 70–99)
Glucose-Capillary: 183 mg/dL — ABNORMAL HIGH (ref 70–99)
Glucose-Capillary: 219 mg/dL — ABNORMAL HIGH (ref 70–99)
Glucose-Capillary: 221 mg/dL — ABNORMAL HIGH (ref 70–99)

## 2022-02-19 MED ORDER — METFORMIN HCL 500 MG PO TABS
500.0000 mg | ORAL_TABLET | Freq: Two times a day (BID) | ORAL | Status: DC
  Administered 2022-02-19 – 2022-02-21 (×5): 500 mg via ORAL
  Filled 2022-02-19 (×5): qty 1

## 2022-02-19 MED ORDER — LOSARTAN POTASSIUM 25 MG PO TABS
25.0000 mg | ORAL_TABLET | Freq: Every day | ORAL | Status: DC
  Administered 2022-02-19 – 2022-02-21 (×3): 25 mg via ORAL
  Filled 2022-02-19 (×3): qty 1

## 2022-02-19 MED ORDER — EMPAGLIFLOZIN 25 MG PO TABS
25.0000 mg | ORAL_TABLET | Freq: Every day | ORAL | Status: DC
  Administered 2022-02-19 – 2022-02-21 (×3): 25 mg via ORAL
  Filled 2022-02-19 (×3): qty 1

## 2022-02-19 NOTE — Progress Notes (Signed)
CARDIAC REHAB PHASE I   PRE:  Rate/Rhythm: 88 / NSR  BP:  Sitting: 137/77      SaO2: 100 %  MODE:  Ambulation: 430 ft   POST:  Rate/Rhythm: 99 / NSR  BP:  Sitting: 138/61      SaO2: 98%  Pt received in bed and agrees to ambulate. Pt ambulated with steady gait using rolling walker. SaO2 ranged from 94-100% with ambulation. Pt denies dizziness, SOB, or pain. Pt back to chair with brake on, call and phone in reach with tray table. Pt pulling 1000 mL on I/S; encouraged to use 10x/hr. OOB to chair encouraged. Encouraged to walk later today with RN. Provided post OHS booklet and provided education. Reviewed move-in-the-tube concept. Reviewed wound care, precautions and S/S to report to MD. Reviewed low Na, heart healthy and diabetic diets. Reviewed activity guidelines and when to terminate exercise. Stressed medication compliance and f/u with MD(s). Pt referred to the [facility] CR2 program. Pt verbalized understanding of education.   9:10 - 10:33 Lesly Rubenstein, MS, ACSM EP-C, [facility] 02/19/2022  10:18 AM

## 2022-02-19 NOTE — Progress Notes (Addendum)
      [address]       [place] [address]             [phone]      3 Days Post-Op Procedure(s) (LRB): CORONARY ARTERY BYPASS GRAFTING (CABG), ON PUMP, TIMES FOUR, USING LEFT INTERNAL MAMMARY ARTERY AND RIGHT ENDOSCOPICALLY HARVESTED GREATER SAPHENOUS VEIN (N/A) TRANSESOPHAGEAL ECHOCARDIOGRAM (TEE) (N/A)  Subjective:  Patient doing well.  Pain is well controlled.  Denies N/V.  + ambulation   + BM  Objective: Vital signs in last 24 hours: Temp:  [97.8 F (36.6 C)-98.9 F (37.2 C)] 98.3 F (36.8 C) (09/23 0656) Pulse Rate:  [25-93] 93 (09/23 0656) Cardiac Rhythm: Normal sinus rhythm (09/22 1949) Resp:  [19-27] 20 (09/23 0656) BP: (111-149)/(47-75) 133/75 (09/23 0656) SpO2:  [95 %-100 %] 100 % (09/23 0656) Weight:  [84.9 kg] 84.9 kg (09/23 0656)  Intake/Output from previous day: 09/22 0701 - 09/23 0700 In: 100 [I.V.:100] Out: 20 [Chest Tube:20]  General appearance: alert, cooperative, and no distress Heart: regular rate and rhythm Lungs: clear to auscultation bilaterally Abdomen: soft, non-tender; bowel sounds normal; no masses,  no organomegaly Extremities: edema trace Wound: clean and dry  Lab Results: Recent Labs    02/18/22 0319 02/19/22 0251  WBC 10.0 10.1  HGB 9.4* 9.0*  HCT 28.2* 26.1*  PLT 118* 106*   BMET:  Recent Labs    02/18/22 0319 02/19/22 0251  NA 133* 134*  K 3.8 4.4  CL 105 101  CO2 23 24  GLUCOSE 140* 170*  BUN 19 19  CREATININE 0.99 1.13  CALCIUM 8.6* 9.0    PT/INR:  Recent Labs    02/16/22 1450  LABPROT 17.1*  INR 1.4*   ABG    Component Value Date/Time   PHART 7.353 02/16/2022 2002   HCO3 18.4 (L) 02/16/2022 2002   TCO2 19 (L) 02/16/2022 2002   ACIDBASEDEF 7.0 (H) 02/16/2022 2002   O2SAT 96 02/16/2022 2002   CBG (last 3)  Recent Labs    02/18/22 1619 02/18/22 2120 02/19/22 0655  GLUCAP 168* 193* 144*    Assessment/Plan: S/P Procedure(s) (LRB): CORONARY ARTERY BYPASS GRAFTING (CABG), ON  PUMP, TIMES FOUR, USING LEFT INTERNAL MAMMARY ARTERY AND RIGHT ENDOSCOPICALLY HARVESTED GREATER SAPHENOUS VEIN (N/A) TRANSESOPHAGEAL ECHOCARDIOGRAM (TEE) (N/A)  CV- NSR, + HTN- continue Lopressor, will restart home Cozaar Pulm- no acute issues, no requiring oxygen, continue IS Renal- creatinine remains stable at 1.13, weight is trending down continue Lasix, potassium for now Mild Hyponatremia at 134, due to diuresis Expected post operative blood loss anemia, mild hgb at 9.0 DM-sugars mostly controlled will stop insulin, resume Jardiance, Metformin per home regimen Dispo- patient stable, maintaining NSR, will resume home Cozaar for additional BP control, continue diuretics for now, will place DME order for rolling walker, possibly ready for d/c in next 24-48 hours pending progress   LOS: 3 days    Ellwood Handler, PA-C 02/19/2022   Chart reviewed, patient examined, agree with above. He looks good. Maintaining sinus rhythm on Lopressor. Should be ready to go home by Monday.

## 2022-02-20 LAB — GLUCOSE, CAPILLARY
Glucose-Capillary: 135 mg/dL — ABNORMAL HIGH (ref 70–99)
Glucose-Capillary: 164 mg/dL — ABNORMAL HIGH (ref 70–99)
Glucose-Capillary: 219 mg/dL — ABNORMAL HIGH (ref 70–99)
Glucose-Capillary: 191 mg/dL — ABNORMAL HIGH (ref 70–99)

## 2022-02-20 NOTE — Progress Notes (Addendum)
      [address]       [place] [address]             [phone]      4 Days Post-Op Procedure(s) (LRB): CORONARY ARTERY BYPASS GRAFTING (CABG), ON PUMP, TIMES FOUR, USING LEFT INTERNAL MAMMARY ARTERY AND RIGHT ENDOSCOPICALLY HARVESTED GREATER SAPHENOUS VEIN (N/A) TRANSESOPHAGEAL ECHOCARDIOGRAM (TEE) (N/A)  Subjective:  No new complaints.  Overall doing well.  + ambulation  + BM  Objective: Vital signs in last 24 hours: Temp:  [97.3 F (36.3 C)-98.6 F (37 C)] 98 F (36.7 C) (09/24 0815) Pulse Rate:  [62-89] 84 (09/24 0815) Cardiac Rhythm: Normal sinus rhythm (09/23 1900) Resp:  [19-20] 20 (09/24 0815) BP: (107-141)/(54-69) 135/61 (09/24 0815) SpO2:  [96 %-100 %] 100 % (09/24 0815) Weight:  [82.7 kg] 82.7 kg (09/24 0500)  Intake/Output from previous day: 09/23 0701 - 09/24 0700 In: 180 [P.O.:180] Out: 0   General appearance: alert, cooperative, and no distress Heart: regular rate and rhythm Lungs: clear to auscultation bilaterally Abdomen: soft, non-tender; bowel sounds normal; no masses,  no organomegaly Extremities: edema trace Wound: clean and dry  Lab Results: Recent Labs    02/18/22 0319 02/19/22 0251  WBC 10.0 10.1  HGB 9.4* 9.0*  HCT 28.2* 26.1*  PLT 118* 106*   BMET:  Recent Labs    02/18/22 0319 02/19/22 0251  NA 133* 134*  K 3.8 4.4  CL 105 101  CO2 23 24  GLUCOSE 140* 170*  BUN 19 19  CREATININE 0.99 1.13  CALCIUM 8.6* 9.0    PT/INR: No results for input(s): "LABPROT", "INR" in the last 72 hours. ABG    Component Value Date/Time   PHART 7.353 02/16/2022 2002   HCO3 18.4 (L) 02/16/2022 2002   TCO2 19 (L) 02/16/2022 2002   ACIDBASEDEF 7.0 (H) 02/16/2022 2002   O2SAT 96 02/16/2022 2002   CBG (last 3)  Recent Labs    02/19/22 1627 02/19/22 2022 02/20/22 0615  GLUCAP 219* 221* 135*    Assessment/Plan: S/P Procedure(s) (LRB): CORONARY ARTERY BYPASS GRAFTING (CABG), ON PUMP, TIMES FOUR, USING LEFT INTERNAL  MAMMARY ARTERY AND RIGHT ENDOSCOPICALLY HARVESTED GREATER SAPHENOUS VEIN (N/A) TRANSESOPHAGEAL ECHOCARDIOGRAM (TEE) (N/A)  CV- NSR, BP improved- continue Lopressor, Cozaar Pulm- off oxygen, no acute issues, Renal-weight is almost at baseline, minimal edema on exam, continue Lasix for now DM- sugars are improved- continue Metformin, Jardiance, SSIP Dispo- patient overall doing well, if remains clinically stable for discharge home in AM   LOS: 4 days   [place] Handler, PA-C 02/20/2022   Chart reviewed, patient examined, agree with above. Plan home tomorrow if no changes.

## 2022-02-20 NOTE — Progress Notes (Signed)
Pt ambulated with front wheel walker  x 470 feet steady gate pt tolerated well

## 2022-02-21 LAB — GLUCOSE, CAPILLARY: Glucose-Capillary: 142 mg/dL — ABNORMAL HIGH (ref 70–99)

## 2022-02-21 MED ORDER — METOPROLOL TARTRATE 25 MG PO TABS: 25.0000 mg | ORAL_TABLET | Freq: Two times a day (BID) | ORAL | 3 refills | Status: DC

## 2022-02-21 MED ORDER — ACETAMINOPHEN 500 MG PO TABS: 500.0000 mg | ORAL_TABLET | Freq: Four times a day (QID) | ORAL | 0 refills | Status: AC | PRN

## 2022-02-21 MED ORDER — POTASSIUM CHLORIDE CRYS ER 20 MEQ PO TBCR: 20.0000 meq | EXTENDED_RELEASE_TABLET | Freq: Every day | ORAL | 0 refills | Status: DC

## 2022-02-21 MED ORDER — OXYCODONE HCL 5 MG PO TABS: 5.0000 mg | ORAL_TABLET | Freq: Four times a day (QID) | ORAL | 0 refills | Status: DC | PRN

## 2022-02-21 MED ORDER — FUROSEMIDE 40 MG PO TABS: 40.0000 mg | ORAL_TABLET | Freq: Every day | ORAL | 0 refills | Status: DC

## 2022-02-21 NOTE — Care Management Important Message (Signed)
Important Message  Patient Details  Name: [name] MRN: [id number] Date of Birth: February 12, 1946   Medicare Important Message Given:  Yes     Shelda Altes 02/21/2022, 9:38 AM

## 2022-02-21 NOTE — Progress Notes (Signed)
[address]       [place] [address]             [phone]      5 Days Post-Op Procedure(s) (LRB): CORONARY ARTERY BYPASS GRAFTING (CABG), ON PUMP, TIMES FOUR, USING LEFT INTERNAL MAMMARY ARTERY AND RIGHT ENDOSCOPICALLY HARVESTED GREATER SAPHENOUS VEIN (N/A) TRANSESOPHAGEAL ECHOCARDIOGRAM (TEE) (N/A) Subjective: Feels well  Objective: Vital signs in last 24 hours: Temp:  [97.8 F (36.6 C)-98.9 F (37.2 C)] 98.5 F (36.9 C) (09/25 0305) Pulse Rate:  [74-89] 74 (09/25 0305) Cardiac Rhythm: Normal sinus rhythm (09/24 1925) Resp:  [16-20] 16 (09/25 0305) BP: (106-141)/(56-73) 139/71 (09/25 0305) SpO2:  [95 %-100 %] 96 % (09/25 0305) Weight:  [82.3 kg] 82.3 kg (09/25 0305)  Hemodynamic parameters for last 24 hours:    Intake/Output from previous day: 09/24 0701 - 09/25 0700 In: 240 [P.O.:240] Out: -  Intake/Output this shift: No intake/output data recorded.  General appearance: alert, cooperative, and no distress Heart: regular rate and rhythm Lungs: clear to auscultation bilaterally Abdomen: benign Extremities: minor edema Wound: incis healing well, some right thigh echymosis  Lab Results: Recent Labs    02/19/22 0251  WBC 10.1  HGB 9.0*  HCT 26.1*  PLT 106*   BMET:  Recent Labs    02/19/22 0251  NA 134*  K 4.4  CL 101  CO2 24  GLUCOSE 170*  BUN 19  CREATININE 1.13  CALCIUM 9.0    PT/INR: No results for input(s): "LABPROT", "INR" in the last 72 hours. ABG    Component Value Date/Time   PHART 7.353 02/16/2022 2002   HCO3 18.4 (L) 02/16/2022 2002   TCO2 19 (L) 02/16/2022 2002   ACIDBASEDEF 7.0 (H) 02/16/2022 2002   O2SAT 96 02/16/2022 2002   CBG (last 3)  Recent Labs    02/20/22 1633 02/20/22 2048 02/21/22 0609  GLUCAP 219* 191* 142*    Meds Scheduled Meds:  acetaminophen  1,000 mg Oral Q6H   Or   acetaminophen (TYLENOL) oral liquid 160 mg/5 mL  1,000 mg Per Tube Q6H   apixaban  5 mg Oral BID   aspirin  81  mg Oral Daily   bisacodyl  10 mg Oral Daily   Or   bisacodyl  10 mg Rectal Daily   docusate sodium  200 mg Oral Daily   empagliflozin  25 mg Oral Daily   furosemide  40 mg Oral Daily   insulin aspart  0-24 Units Subcutaneous TID WC & HS   losartan  25 mg Oral Daily   metFORMIN  500 mg Oral BID WC   metoprolol tartrate  25 mg Oral BID   pantoprazole  40 mg Oral Daily   rosuvastatin  20 mg Oral Daily   Continuous Infusions: PRN Meds:.metoprolol tartrate, ondansetron (ZOFRAN) IV, mouth rinse, oxyCODONE, traMADol  Xrays No results found.  Assessment/Plan: S/P Procedure(s) (LRB): CORONARY ARTERY BYPASS GRAFTING (CABG), ON PUMP, TIMES FOUR, USING LEFT INTERNAL MAMMARY ARTERY AND RIGHT ENDOSCOPICALLY HARVESTED GREATER SAPHENOUS VEIN (N/A) TRANSESOPHAGEAL ECHOCARDIOGRAM (TEE) (N/A)  POD#5  1 afeb, VSS, sBP 100-140's, sinus rhtyhm, occas PVC's 2 O2 sats good on RA 3 voiding well, not measured, weight appears below preop- willd/c on for 5 days with some LE edema 4 no new labs 5 CBG- fair control, back on home meds at D/C, will need close f/u by PMD, Hg A1C- 6.9 6 stable for d/c to home- reviewed instructions    LOS: 5 days  John Giovanni PA-C Pager [phone] 02/21/2022

## 2022-02-21 NOTE — Progress Notes (Signed)
CARDIAC REHAB PHASE I   PRE:  Rate/Rhythm: 76 NSR  BP:  Sitting: 134/88      SaO2: RA  MODE:  Ambulation: 380 ft   POST:  Rate/Rhythm: 95 NSR  BP:  Sitting: 127/63      SaO2: RA   Pt was received from bed following sternal precautions and ambulated through hallways with RW. During ambulation pt was asymptomatic and was returned to bed w/o complaints. Pt was encouraged to continue following sternal precautions, restrictions, and using IS 10x/hour.   [phone]  Christen Bame  10:29 AM 02/21/2022

## 2022-02-21 NOTE — Progress Notes (Signed)
Patient discharging home with support from family. IV removed without complications. Tele removed and CCMD notified. Discharge instructions given and medication administration discussed. All questions answered.  [name]

## 2022-02-21 NOTE — TOC Transition Note (Signed)
Transition of Care (TOC) - CM/SW Discharge Note [name] RN, BSN Transitions of Care Unit 4E- RN Case Manager See Treatment Team for direct phone #    Patient Details  Name: [name] MRN: [id number] Date of Birth: 09/25/1945  [facility] CM/SW Contact:  Dawayne Patricia, RN Phone Number: 02/21/2022, 10:21 AM   Clinical Narrative:    Pt stable for transition home today, Order placed for DME- RW, pt has confirmed he wants DME for home.  CM called in house provider [employer] for DME need- RW to be delivered to room prior to discharge. No further TOC needs noted.  Family to transport home.      Final next level of care: Home/Self Care Barriers to Discharge: Barriers Resolved   Patient Goals and CMS Choice Patient states their goals for this hospitalization and ongoing recovery are:: to return home CMS Medicare.gov Compare Post Acute Care list provided to:: Patient Choice offered to / list presented to : Patient  Discharge Placement               Home        Discharge Plan and Services In-house Referral: NA Discharge Planning Services: CM Consult Post Acute Care Choice: Durable Medical Equipment          DME Arranged: Walker rolling DME Agency: [facility] Date DME Agency Contacted: 02/21/22 Time DME Agency Contacted: [phone] Representative spoke with at DME Agency: Jodell Cipro HH Arranged: NA [place] Agency: NA        Social Determinants of Health ([place]) Interventions     Readmission Risk Interventions    02/21/2022   10:21 AM  Readmission Risk Prevention Plan  Post Dischage Appt Complete  Medication Screening Complete  Transportation Screening Complete

## 2022-02-25 ENCOUNTER — Ambulatory Visit (INDEPENDENT_AMBULATORY_CARE_PROVIDER_SITE_OTHER): Payer: Self-pay | Admitting: Thoracic Surgery (Cardiothoracic Vascular Surgery)

## 2022-02-25 DIAGNOSIS — Z951 Presence of aortocoronary bypass graft: Secondary | ICD-10-CM

## 2022-02-25 NOTE — Progress Notes (Signed)
     [address]       [place] [address]             [phone]       Patient: Home Provider: Office Consent for Telemedicine visit obtained.  Today's visit was completed via a real-time telehealth (see specific modality noted below). The patient/authorized person provided oral consent at the time of the visit to engage in a telemedicine encounter with the present provider at [facility]. The patient/authorized person was informed of the potential benefits, limitations, and risks of telemedicine. The patient/authorized person expressed understanding that the laws that protect confidentiality also apply to telemedicine. The patient/authorized person acknowledged understanding that telemedicine does not provide emergency services and that he or she would need to call 911 or proceed to the nearest hospital for help if such a need arose.   Total time spent in the clinical discussion 10 minutes.  Telehealth Modality: Phone visit (audio only)  I had a telephone visit with  Orlie Pollen who is s/p CABG.  Overall doing well.   Pain is minimal.  Ambulating well. Vitals have been stable.  Orlie Pollen will see [place] back in 1 month with a chest x-ray for cardiac rehab clearance.  Krysten Veronica Bary Leriche

## 2022-03-01 ENCOUNTER — Telehealth (HOSPITAL_COMMUNITY): Payer: Self-pay | Admitting: *Deleted

## 2022-03-01 NOTE — Telephone Encounter (Signed)
Returned call for message left on departmental voicemail by this pt wife Kwan Shellhammer. Spoke with pt wife Danton Clap who is listed on the DPR   Advised to please disregard any text messages she may received in regards to cardiac rehab.  These are generated from a third party network and do not bear any readiness for scheduling.  Pt has upcoming follow up appt with cardiology and CVTS that will need to be completed satisfactorily before readiness to add to the scheduling wait list.  Verbalized understanding.  Cherre Huger, BSN Cardiac and Training and development officer
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
## 2022-03-03 ENCOUNTER — Ambulatory Visit (INDEPENDENT_AMBULATORY_CARE_PROVIDER_SITE_OTHER): Payer: Medicare Other | Admitting: Podiatry

## 2022-03-03 DIAGNOSIS — D689 Coagulation defect, unspecified: Secondary | ICD-10-CM

## 2022-03-03 DIAGNOSIS — M79676 Pain in unspecified toe(s): Secondary | ICD-10-CM

## 2022-03-03 DIAGNOSIS — E1142 Type 2 diabetes mellitus with diabetic polyneuropathy: Secondary | ICD-10-CM

## 2022-03-03 DIAGNOSIS — B351 Tinea unguium: Secondary | ICD-10-CM | POA: Diagnosis not present

## 2022-03-03 NOTE — Progress Notes (Signed)
He presents today for his diabetic nail and foot care.  Has just recently had open heart surgery with venous graft from the right leg.  Continues anticoagulants and is assisted by his son and walker.  Objective: Vital signs are stable he is alert oriented x3 pulses are palpable right greater than left.  No open lesions or wounds are noted.  Toenails are long thick dystrophic sharply incurvated.  Assessment pain in limb secondary to diabetic peripheral neuropathy painful nails.  Plan: Debrided nails for him today covered service secondary to diabetes clinical findings and anticoagulant therapy.

## 2022-03-08 ENCOUNTER — Ambulatory Visit: Payer: Medicare Other | Admitting: Podiatry

## 2022-03-09 ENCOUNTER — Ambulatory Visit: Payer: Medicare Other | Attending: Physician Assistant | Admitting: Physician Assistant

## 2022-03-09 ENCOUNTER — Encounter: Payer: Self-pay | Admitting: Physician Assistant

## 2022-03-09 ENCOUNTER — Telehealth: Payer: Self-pay | Admitting: Cardiology

## 2022-03-09 VITALS — BP 110/64 | HR 56 | Ht 72.0 in | Wt 182.0 lb

## 2022-03-09 DIAGNOSIS — Z86711 Personal history of pulmonary embolism: Secondary | ICD-10-CM

## 2022-03-09 DIAGNOSIS — Z951 Presence of aortocoronary bypass graft: Secondary | ICD-10-CM | POA: Diagnosis not present

## 2022-03-09 DIAGNOSIS — I10 Essential (primary) hypertension: Secondary | ICD-10-CM

## 2022-03-09 DIAGNOSIS — E1100 Type 2 diabetes mellitus with hyperosmolarity without nonketotic hyperglycemic-hyperosmolar coma (NKHHC): Secondary | ICD-10-CM | POA: Diagnosis not present

## 2022-03-09 DIAGNOSIS — E785 Hyperlipidemia, unspecified: Secondary | ICD-10-CM | POA: Diagnosis not present

## 2022-03-09 LAB — BASIC METABOLIC PANEL
BUN/Creatinine Ratio: 17 (ref 10–24)
BUN: 19 mg/dL (ref 8–27)
CO2: 22 mmol/L (ref 20–29)
Calcium: 9.7 mg/dL (ref 8.6–10.2)
Chloride: 100 mmol/L (ref 96–106)
Creatinine, Ser: 1.11 mg/dL (ref 0.76–1.27)
Glucose: 180 mg/dL — ABNORMAL HIGH (ref 70–99)
Potassium: 4.9 mmol/L (ref 3.5–5.2)
Sodium: 135 mmol/L (ref 134–144)
eGFR: 68 mL/min/{1.73_m2} (ref >59)

## 2022-03-09 LAB — CBC
Hematocrit: 34.8 % — ABNORMAL LOW (ref 37.5–51.0)
Hemoglobin: 11.6 g/dL — ABNORMAL LOW (ref 13.0–17.7)
MCH: 31.3 pg (ref 26.6–33.0)
MCHC: 33.3 g/dL (ref 31.5–35.7)
MCV: 94 fL (ref 79–97)
Platelets: 228 10*3/uL (ref 150–450)
RBC: 3.71 x10E6/uL — ABNORMAL LOW (ref 4.14–5.80)
RDW: 13.7 % (ref 11.6–15.4)
WBC: 7.3 10*3/uL (ref 3.4–10.8)

## 2022-03-09 NOTE — Patient Instructions (Addendum)
Medication Instructions:  Your physician recommends that you continue on your current medications as directed. Please refer to the Current Medication list given to you today.  *If you need a refill on your cardiac medications before your next appointment, please call your pharmacy*   Lab Work: CBC and BMET today Fasting lipid panel prior to 2 month follow up If you have labs (blood work) drawn today and your tests are completely normal, you will receive your results only by: [place] (if you have MyChart) OR A paper copy in the mail If you have any lab test that is abnormal or we need to change your treatment, we will call you to review the results.    Follow-Up: At [facility], you and your health needs are our priority.  As part of our continuing mission to provide you with exceptional heart care, we have created designated Provider Care Teams.  These Care Teams include your primary Cardiologist (physician) and Advanced Practice Providers (APPs -  Physician Assistants and Nurse Practitioners) who all work together to provide you with the care you need, when you need it.   Your next appointment:   2 month(s) with fasting lipid panel prior  The format for your next appointment:   In Person  Provider:   Candee Furbish, MD  or Ermalinda Barrios, PA-C       Other Instructions 1.You can start cardiac rehab 2.Use your incentive spirometer  Important Information About Sugar

## 2022-03-09 NOTE — Telephone Encounter (Signed)
Pt wife would like to know if pt is okay to get a covid booster shot

## 2022-03-09 NOTE — Telephone Encounter (Signed)
[name], pt saw you today in clinic.  His wife is calling to ask you if it is safe from a cardiac perspective and with his cardiac meds, to proceed with getting the covid booster shot.  Please advise!

## 2022-03-09 NOTE — Telephone Encounter (Signed)
Gilman, Olazabal T - 03/09/2022 12:48 PM Imogene Burn, PA-C  Sent: Wed March 09, 2022  2:10 PM  To: Nuala Alpha, LPN          Message  Yes he can get the covid booster thanks    Pts wife aware that per Ermalinda Barrios PA-C, he is ok to get the covid booster shot.  Wife verbalized understanding and agrees with this plan.  Wife was more than gracious for all the assistance provided.

## 2022-03-24 ENCOUNTER — Other Ambulatory Visit: Payer: Self-pay | Admitting: Thoracic Surgery (Cardiothoracic Vascular Surgery)

## 2022-03-24 DIAGNOSIS — Z951 Presence of aortocoronary bypass graft: Secondary | ICD-10-CM

## 2022-03-28 ENCOUNTER — Other Ambulatory Visit: Payer: Self-pay

## 2022-03-28 ENCOUNTER — Ambulatory Visit
Admission: RE | Admit: 2022-03-28 | Discharge: 2022-03-28 | Disposition: A | Discharge status: Home / Self Care | Payer: Medicare Other | Source: Ambulatory Visit | Attending: Thoracic Surgery (Cardiothoracic Vascular Surgery) | Admitting: Thoracic Surgery (Cardiothoracic Vascular Surgery)

## 2022-03-28 ENCOUNTER — Ambulatory Visit (INDEPENDENT_AMBULATORY_CARE_PROVIDER_SITE_OTHER): Payer: Self-pay | Admitting: Physician Assistant

## 2022-03-28 VITALS — BP 120/68 | HR 59 | Resp 20 | Ht 72.0 in | Wt 182.3 lb

## 2022-03-28 DIAGNOSIS — Z951 Presence of aortocoronary bypass graft: Secondary | ICD-10-CM

## 2022-03-28 DIAGNOSIS — I700 Atherosclerosis of aorta: Secondary | ICD-10-CM | POA: Diagnosis not present

## 2022-03-28 DIAGNOSIS — Z01818 Encounter for other preprocedural examination: Secondary | ICD-10-CM | POA: Diagnosis not present

## 2022-03-28 NOTE — Progress Notes (Signed)
[address]       [place] [address]             [phone]       HPI:  Mr. [name] is a 76 year old gentleman with a past history of moderate mitral insufficiency, type 2 diabetes mellitus, hypertension, pulmonary embolus, and dyslipidemia.  He was recently evaluated for progressive angina and found to have significant obstructive coronary artery disease. Marland Kitchen  He subsequently underwent coronary bypass grafting x4 on 02/16/2022 by Dr. Kipp Brood.  Transesophageal echocardiogram done at the time of surgery did not show significant valvular disease to warrant mitral valve repair or replacement.  Following surgery, Mr. [name] made a  progressive and satisfactory recovery.  He was discharged on his Eliquis for the history of PE.  He has been seen in virtual follow-up by Dr. Kipp Brood and by cardiology as well. He returns today for scheduled follow-up with chest x-ray. Overall, Mr. [name] feels he is progressing as expected.  He has no new problems he wanted to discuss.  He is not having any incisional pain and denies shortness of breath.  He walks about 20 minutes daily.  He would like to get started with the cardiac rehab program.   Current Outpatient Medications  Medication Sig Dispense Refill   acetaminophen (TYLENOL) 500 MG tablet Take 1-2 tablets (500-1,000 mg total) by mouth every 6 (six) hours as needed. 30 tablet 0   apixaban (ELIQUIS) 5 MG TABS tablet Take 5 mg by mouth 2 (two) times daily.     aspirin EC 81 MG tablet Take 1 tablet (81 mg total) by mouth daily. Swallow whole. 90 tablet 3   B-D UF III MINI PEN NEEDLES 31G X 5 MM MISC USE ONCE A DAY INJECTION 90 DAYS     desonide (DESOWEN) 0.05 % cream Apply 1 application topically daily as needed (eczema).      Insulin Glargine (BASAGLAR KWIKPEN) 100 UNIT/ML SOPN Inject 35 Units into the skin at bedtime.     JARDIANCE 25 MG TABS tablet Take 25 mg by mouth daily.     losartan (COZAAR) 25 MG tablet Take 25 mg by mouth  daily.      metFORMIN (GLUCOPHAGE-XR) 500 MG 24 hr tablet Take 1 tablet (500 mg total) by mouth 2 (two) times daily.  6   metoprolol tartrate (LOPRESSOR) 25 MG tablet Take 1 tablet (25 mg total) by mouth 2 (two) times daily. 180 tablet 3   nitroGLYCERIN (NITROSTAT) 0.4 MG SL tablet Place 1 tablet (0.4 mg total) under the tongue every 5 (five) minutes as needed for chest pain. 25 tablet 3   oxyCODONE (OXY IR/ROXICODONE) 5 MG immediate release tablet Take 1 tablet (5 mg total) by mouth every 6 (six) hours as needed for severe pain. 28 tablet 0   potassium chloride SA (KLOR-CON M) 20 MEQ tablet Take 1 tablet (20 mEq total) by mouth daily. 5 tablet 0   rosuvastatin (CRESTOR) 20 MG tablet Take 1 tablet (20 mg total) by mouth daily. 90 tablet 3   No current facility-administered medications for this visit.    Physical Exam Vital signs BP 120/68 Pulse 59 Respirations 20 SPO2 96% on room air  Heart: Regular rate and rhythm Chest: Breath sounds are full, clear, and equal.  Chest x-ray shows expected postoperative changes but the lung fields are clear and there are no significant pleural effusions. Extremities: The right lower extremity EVH incision is healing with no sign of complication.  There is no peripheral edema.  Diagnostic Tests: CLINICAL DATA:  S/p CABG   EXAM: CHEST - 2 VIEW   COMPARISON:  02/18/22   FINDINGS: Status post median sternotomy CABG. Sternotomy wires are intact. Cardiac and mediastinal contours are similar to preoperative radiograph dated 02/14/2022. No focal airspace opacity. Subdiaphragmatic lucency on the left is favored to represent gas in the stomach. No displaced rib fracture. Atherosclerotic calcifications of the aorta. IVC filter in place. Right upper quadrant surgical clips in place. Vertebral body heights are maintained.   IMPRESSION: No acute cardiopulmonary process.     Electronically Signed   By: Marin Roberts M.D.   On: 03/28/2022  13:28  Impression / Plan: Mr. [name] is progressing well post coronary bypass grafting x4 about 6 weeks ago.  He may participate in cardiac rehab, referral was made today.  He is encouraged to continue to observe sternal precautions for another 6 weeks.  No change in medications is indicated.  Follow-up as needed.   Antony Odea, PA-C [facility] [phone]

## 2022-03-28 NOTE — Patient Instructions (Signed)
You may begin driving.  Referral was made for cardiac rehab and participation is encouraged at this point.  Continue to observe sternal precautions with no lifting greater than 15 pounds for another 6 weeks.  After that, you may advance your activity without restriction.  Follow-up as needed

## 2022-03-28 NOTE — Progress Notes (Signed)
Ambulatory referral placed for outpatient cardiac rehab at [facility] per Dr. Abran Duke orders.

## 2022-04-06 DIAGNOSIS — E119 Type 2 diabetes mellitus without complications: Secondary | ICD-10-CM | POA: Diagnosis not present

## 2022-04-28 DIAGNOSIS — E1169 Type 2 diabetes mellitus with other specified complication: Secondary | ICD-10-CM | POA: Diagnosis not present

## 2022-04-28 DIAGNOSIS — I10 Essential (primary) hypertension: Secondary | ICD-10-CM | POA: Diagnosis not present

## 2022-04-28 DIAGNOSIS — E782 Mixed hyperlipidemia: Secondary | ICD-10-CM | POA: Diagnosis not present

## 2022-04-28 DIAGNOSIS — M170 Bilateral primary osteoarthritis of knee: Secondary | ICD-10-CM | POA: Diagnosis not present

## 2022-05-04 ENCOUNTER — Encounter: Payer: Self-pay | Admitting: Cardiology

## 2022-05-05 DIAGNOSIS — E782 Mixed hyperlipidemia: Secondary | ICD-10-CM | POA: Diagnosis not present

## 2022-05-05 DIAGNOSIS — I10 Essential (primary) hypertension: Secondary | ICD-10-CM | POA: Diagnosis not present

## 2022-05-05 DIAGNOSIS — E1169 Type 2 diabetes mellitus with other specified complication: Secondary | ICD-10-CM | POA: Diagnosis not present

## 2022-05-05 DIAGNOSIS — M170 Bilateral primary osteoarthritis of knee: Secondary | ICD-10-CM | POA: Diagnosis not present

## 2022-05-05 DIAGNOSIS — I2510 Atherosclerotic heart disease of native coronary artery without angina pectoris: Secondary | ICD-10-CM | POA: Diagnosis not present

## 2022-05-05 DIAGNOSIS — E1140 Type 2 diabetes mellitus with diabetic neuropathy, unspecified: Secondary | ICD-10-CM | POA: Diagnosis not present

## 2022-05-09 ENCOUNTER — Ambulatory Visit: Payer: Medicare Other | Attending: Physician Assistant

## 2022-05-09 DIAGNOSIS — Z86711 Personal history of pulmonary embolism: Secondary | ICD-10-CM

## 2022-05-09 DIAGNOSIS — E785 Hyperlipidemia, unspecified: Secondary | ICD-10-CM

## 2022-05-09 DIAGNOSIS — Z951 Presence of aortocoronary bypass graft: Secondary | ICD-10-CM

## 2022-05-09 DIAGNOSIS — E1100 Type 2 diabetes mellitus with hyperosmolarity without nonketotic hyperglycemic-hyperosmolar coma (NKHHC): Secondary | ICD-10-CM

## 2022-05-09 DIAGNOSIS — I10 Essential (primary) hypertension: Secondary | ICD-10-CM | POA: Diagnosis not present

## 2022-05-10 LAB — LIPID PANEL
Chol/HDL Ratio: 2.5 ratio (ref 0.0–5.0)
Cholesterol, Total: 157 mg/dL (ref 100–199)
HDL: 63 mg/dL (ref >39)
LDL Chol Calc (NIH): 78 mg/dL (ref 0–99)
Triglycerides: 83 mg/dL (ref 0–149)
VLDL Cholesterol Cal: 16 mg/dL (ref 5–40)

## 2022-05-12 ENCOUNTER — Encounter: Payer: Self-pay | Admitting: Cardiology

## 2022-05-12 ENCOUNTER — Ambulatory Visit: Payer: Medicare Other | Attending: Cardiology | Admitting: Cardiology

## 2022-05-12 VITALS — BP 112/60 | HR 56 | Ht 72.0 in | Wt 187.0 lb

## 2022-05-12 DIAGNOSIS — I2510 Atherosclerotic heart disease of native coronary artery without angina pectoris: Secondary | ICD-10-CM

## 2022-05-12 DIAGNOSIS — I2583 Coronary atherosclerosis due to lipid rich plaque: Secondary | ICD-10-CM

## 2022-05-12 DIAGNOSIS — I10 Essential (primary) hypertension: Secondary | ICD-10-CM | POA: Diagnosis not present

## 2022-05-12 DIAGNOSIS — E785 Hyperlipidemia, unspecified: Secondary | ICD-10-CM | POA: Diagnosis not present

## 2022-05-12 DIAGNOSIS — Z951 Presence of aortocoronary bypass graft: Secondary | ICD-10-CM

## 2022-05-12 MED ORDER — EZETIMIBE 10 MG PO TABS: 10.0000 mg | ORAL_TABLET | Freq: Every day | ORAL | 3 refills | Status: DC

## 2022-05-12 NOTE — Patient Instructions (Signed)
Medication Instructions:  Please start Zetia 10 mg daily. Continue all other medications as listed.  *If you need a refill on your cardiac medications before your next appointment, please call your pharmacy*   Follow-Up: At [facility], you and your health needs are our priority.  As part of our continuing mission to provide you with exceptional heart care, we have created designated Provider Care Teams.  These Care Teams include your primary Cardiologist (physician) and Advanced Practice Providers (APPs -  Physician Assistants and Nurse Practitioners) who all work together to provide you with the care you need, when you need it.  We recommend signing up for the patient portal called "MyChart".  Sign up information is provided on this After Visit Summary.  MyChart is used to connect with patients for Virtual Visits (Telemedicine).  Patients are able to view lab/test results, encounter notes, upcoming appointments, etc.  Non-urgent messages can be sent to your provider as well.   To learn more about what you can do with MyChart, go to [email or web address].    Your next appointment:   4 month(s)  The format for your next appointment:   In Person  Provider:   Candee Furbish, MD      Important Information About Sugar
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
## 2022-05-16 ENCOUNTER — Telehealth (HOSPITAL_COMMUNITY): Payer: Self-pay

## 2022-05-16 NOTE — Telephone Encounter (Signed)
Called patient to see if he was interested in participating in the Cardiac Rehab Program. Patient stated yes. Patient will come in for orientation on 05/17/22'@9'$ :30am and will attend the 1:45pm exercise class.

## 2022-05-17 ENCOUNTER — Encounter (HOSPITAL_COMMUNITY)
Admission: RE | Admit: 2022-05-17 | Discharge: 2022-05-17 | Disposition: A | Discharge status: Home / Self Care | Payer: Medicare Other | Source: Ambulatory Visit | Attending: Cardiology | Admitting: Cardiology

## 2022-05-17 ENCOUNTER — Telehealth (HOSPITAL_COMMUNITY): Payer: Self-pay | Admitting: *Deleted

## 2022-05-17 ENCOUNTER — Encounter (HOSPITAL_COMMUNITY): Payer: Self-pay

## 2022-05-17 ENCOUNTER — Telehealth: Payer: Self-pay

## 2022-05-17 VITALS — BP 106/70 | HR 46 | Ht 72.25 in | Wt 183.0 lb

## 2022-05-17 DIAGNOSIS — Z48812 Encounter for surgical aftercare following surgery on the circulatory system: Secondary | ICD-10-CM | POA: Diagnosis not present

## 2022-05-17 DIAGNOSIS — Z951 Presence of aortocoronary bypass graft: Secondary | ICD-10-CM | POA: Insufficient documentation

## 2022-05-17 MED ORDER — METOPROLOL TARTRATE 25 MG PO TABS: 12.5000 mg | ORAL_TABLET | Freq: Two times a day (BID) | ORAL | 3 refills | Status: DC

## 2022-05-17 NOTE — Telephone Encounter (Signed)
Lets go ahead and decrease to 12.5 mg twice a day. Thank you so much for your help. Per Dr Kingsley Plan. Patient notified of medication change. Will not take any more metoprolol today as he took 25 mg of metoprolol this morning. Patient says he will break the 25 mg and take 12.5 mg in the morning and 12.5 mg in the evening.Barnet Pall, RN,BSN 05/17/2022 11:30 AM

## 2022-05-17 NOTE — Telephone Encounter (Signed)
morning Dr Marlou Porch. I have Mr Okazaki here with me at cardiac rehab orientation.Mr Guy Sandifer Nonsustained sinus brady noted rate as low as 46 . Asymptomatic. Blood pressure 106/70. His heart rate comes up into the 50's and 60's with activity. We are getting ready to do his 6 minute walk test would you please consider decreasing his metoprolol? Mr Dykman is taking 25 mg twice a day his heart rate was 56 when you saw him in the office a few days ago. I will forward full vitals and ECG from today after orientation.   56 mins MS Jerline Pain, MD

## 2022-05-17 NOTE — Telephone Encounter (Signed)
Per secure chat  Alfred Levins.Whitaker,RN: Good morning Dr Marlou Porch. I have Mr Nibert here with me at cardiac rehab orientation.Mr Guy Sandifer Nonsustained sinus brady noted rate as low as 46 . Asymptomatic. Blood pressure 106/70. His heart rate comes up into the 50's and 60's with activity. We are getting ready to do his 6 minute walk test would you please consider decreasing his metoprolol? Mr Burgueno is taking 25 mg twice a day his heart rate was 56 when you saw him in the office a few days ago. I will forward full vitals and ECG from today after orientation.  Lets go ahead and decrease to 12.5 mg twice a day. Thank you so much for your help.   Mark C. Skains,MD: Lets go ahead and decrease to 12.5 mg twice a day. Thank you so much for your help.   Metoprolol Tartrate Rx changed to 12.'5mg'$  (1/2 tablet) by mouth twice daily.  Pt was made aware of change by Cardiac Rehab RN M. Whitaker.

## 2022-05-17 NOTE — Progress Notes (Addendum)
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 cancers. In addition to exercise, following the [place] can help reduce your risk.  Decoding Lab Results  Clinical staff conducted group or individual video education with verbal and written material and guidebook.  Patient learns that lab test reflects one measurement whose values change over time and are influenced by many factors, including medication, stress, sleep, exercise, food, hydration, pre-existing medical conditions, and more. It is recommended to use the knowledge from this video to become more involved with your lab results and evaluate your numbers to speak with your doctor.   Diseases of Our Time - Overview  Clinical staff conducted  group or individual video education with verbal and written material and guidebook.  Patient learns that according to the CDC, 50% to 70% of chronic diseases (such as obesity, type 2 diabetes, elevated lipids, hypertension, and heart disease) are avoidable through lifestyle improvements including healthier food choices, listening to satiety cues, and increased physical activity.  Sleep Disorders Clinical staff conducted group or individual video education with verbal and written material and guidebook.  Patient learns how good quality and duration of sleep are important to overall health and well-being. Patient also learns about sleep disorders and how they impact health along with recommendations to address them, including discussing with a physician.  Nutrition  Dining Out - Part 2 Clinical staff conducted group or individual video education with verbal and written material and guidebook.  Patient learns how to plan ahead and communicate in order to maximize their dining experience in a healthy and nutritious manner. Included are recommended food choices based on the type of restaurant the patient is visiting.   Fueling a Best boy conducted group or individual video education with verbal and written material and guidebook.  There is a strong connection between our food choices and our health. Diseases like obesity and type 2 diabetes are very prevalent and are in large-part due to lifestyle choices. The Pritikin Eating Plan provides plenty of food and hunger-curbing satisfaction. It is easy to follow, affordable, and helps reduce health risks.  Menu Workshop  Clinical staff conducted group or individual video education with verbal and written material and guidebook.  Patient learns that restaurant meals can sabotage health goals because they are often packed with calories, fat, sodium,  and sugar. Recommendations include strategies to plan ahead and to communicate with the  manager, chef, or server to help order a healthier meal.  Planning Your Eating Strategy  Clinical staff conducted group or individual video education with verbal and written material and guidebook.  Patient learns about the [place] and its benefit of reducing the risk of disease. The [place] does not focus on calories. Instead, it emphasizes high-quality, nutrient-rich foods. By knowing the characteristics of the foods, we choose, we can determine their calorie density and make informed decisions.  Targeting Your Nutrition Priorities  Clinical staff conducted group or individual video education with verbal and written material and guidebook.  Patient learns that lifestyle habits have a tremendous impact on disease risk and progression. This video provides eating and physical activity recommendations based on your personal health goals, such as reducing LDL cholesterol, losing weight, preventing or controlling type 2 diabetes, and reducing high blood pressure.  Vitamins and Minerals  Clinical staff conducted group or individual video education with verbal and written material and guidebook.  Patient learns different ways to obtain key vitamins and minerals, including through a recommended healthy diet. It is important to discuss all supplements you take with your doctor.   Healthy Mind-Set    Smoking Cessation  Clinical staff conducted group or individual video education with verbal and written material and guidebook.  Patient learns that cigarette smoking and tobacco addiction pose a serious health risk which affects millions of people. Stopping smoking will significantly reduce the risk of heart disease, lung disease, and many forms of cancer. Recommended strategies for quitting are covered, including working with your doctor to develop a successful plan.  Culinary   Becoming a Financial trader conducted group or individual video education with verbal and  written material and guidebook.  Patient learns that cooking at home can be healthy, cost-effective, quick, and puts them in control. Keys to cooking healthy recipes will include looking at your recipe, assessing your equipment needs, planning ahead, making it simple, choosing cost-effective seasonal ingredients, and limiting the use of added fats, salts, and sugars.  Cooking - Breakfast and Snacks  Clinical staff conducted group or individual video education with verbal and written material and guidebook.  Patient learns how important breakfast is to satiety and nutrition through the entire day. Recommendations include key foods to eat during breakfast to help stabilize blood sugar levels and to prevent overeating at meals later in the day. Planning ahead is also a key component.  Cooking - Human resources officer conducted group or individual video education with verbal and written material and guidebook.  Patient learns eating strategies to improve overall health, including an approach to cook more at home. Recommendations include thinking of animal protein as a side on your plate rather than center stage and focusing instead on lower calorie dense options like vegetables, fruits, whole grains, and plant-based proteins, such as beans. Making sauces in large quantities to freeze for later and leaving the skin on your vegetables are also recommended to maximize your experience.  Cooking - Healthy Salads and Dressing Clinical staff conducted group or individual video education with verbal and written material and guidebook.  Patient learns that vegetables, fruits, whole grains, and legumes are the foundations of the [place]. Recommendations include how to incorporate each of these in flavorful and healthy salads, and how to create homemade salad dressings. Proper handling of ingredients is also covered. Cooking -  Soups and Desserts  Cooking - Soups and Desserts Clinical staff  conducted group or individual video education with verbal and written material and guidebook.  Patient learns that Pritikin soups and desserts make for easy, nutritious, and delicious snacks and meal components that are low in sodium, fat, sugar, and calorie density, while high in vitamins, minerals, and filling fiber. Recommendations include simple and healthy ideas for soups and desserts.   Overview     The Pritikin Solution Program Overview Clinical staff conducted group or individual video education with verbal and written material and guidebook.  Patient learns that the results of the [place] Program have been documented in more than 100 articles published in peer-reviewed journals, and the benefits include reducing risk factors for (and, in some cases, even reversing) high cholesterol, high blood pressure, type 2 diabetes, obesity, and more! An overview of the three key pillars of the Pritikin Program will be covered: eating well, doing regular exercise, and having a healthy mind-set.  WORKSHOPS  Exercise: Exercise Basics: Building Your Action Plan Clinical staff led group instruction and group discussion with PowerPoint presentation and patient guidebook. To enhance the learning environment the use of posters, models and videos may be added. At the conclusion of this workshop, patients will comprehend the difference between physical activity and exercise, as well as the benefits of incorporating both, into their routine. Patients will understand the FITT (Frequency, Intensity, Time, and Type) principle and how to use it to build an exercise action plan. In addition, safety concerns and other considerations for exercise and cardiac rehab will be addressed by the presenter. The purpose of this lesson is to promote a comprehensive and effective weekly exercise routine in order to improve patients' overall level of fitness.   Managing Heart Disease: Your Path to a Healthier Heart Clinical  staff led group instruction and group discussion with PowerPoint presentation and patient guidebook. To enhance the learning environment the use of posters, models and videos may be added.At the conclusion of this workshop, patients will understand the anatomy and physiology of the heart. Additionally, they will understand how Pritikin's three pillars impact the risk factors, the progression, and the management of heart disease.  The purpose of this lesson is to provide a high-level overview of the heart, heart disease, and how the Pritikin lifestyle positively impacts risk factors.  Exercise Biomechanics Clinical staff led group instruction and group discussion with PowerPoint presentation and patient guidebook. To enhance the learning environment the use of posters, models and videos may be added. Patients will learn how the structural parts of their bodies function and how these functions impact their daily activities, movement, and exercise. Patients will learn how to promote a neutral spine, learn how to manage pain, and identify ways to improve their physical movement in order to promote healthy living. The purpose of this lesson is to expose patients to common physical limitations that impact physical activity. Participants will learn practical ways to adapt and manage aches and pains, and to minimize their effect on regular exercise. Patients will learn how to maintain good posture while sitting, walking, and lifting.  Balance Training and Fall Prevention  Clinical staff led group instruction and group discussion with PowerPoint presentation and patient guidebook. To enhance the learning environment the use of posters, models and videos may be added. At the conclusion of this workshop, patients will understand the importance of their sensorimotor skills (vision, proprioception, and the vestibular system) in maintaining their ability to balance as they age. Patients  will apply a variety of  balancing exercises that are appropriate for their current level of function. Patients will understand the common causes for poor balance, possible solutions to these problems, and ways to modify their physical environment in order to minimize their fall risk. The purpose of this lesson is to teach patients about the importance of maintaining balance as they age and ways to minimize their risk of falling.  WORKSHOPS   Nutrition:  Fueling a Scientist, research (physical sciences) led group instruction and group discussion with PowerPoint presentation and patient guidebook. To enhance the learning environment the use of posters, models and videos may be added. Patients will review the foundational principles of the [place] and understand what constitutes a serving size in each of the food groups. Patients will also learn Pritikin-friendly foods that are better choices when away from home and review make-ahead meal and snack options. Calorie density will be reviewed and applied to three nutrition priorities: weight maintenance, weight loss, and weight gain. The purpose of this lesson is to reinforce (in a group setting) the key concepts around what patients are recommended to eat and how to apply these guidelines when away from home by planning and selecting Pritikin-friendly options. Patients will understand how calorie density may be adjusted for different weight management goals.  Mindful Eating  Clinical staff led group instruction and group discussion with PowerPoint presentation and patient guidebook. To enhance the learning environment the use of posters, models and videos may be added. Patients will briefly review the concepts of the [place] and the importance of low-calorie dense foods. The concept of mindful eating will be introduced as well as the importance of paying attention to internal hunger signals. Triggers for non-hunger eating and techniques for dealing with triggers will be  explored. The purpose of this lesson is to provide patients with the opportunity to review the basic principles of the [place], discuss the value of eating mindfully and how to measure internal cues of hunger and fullness using the Hunger Scale. Patients will also discuss reasons for non-hunger eating and learn strategies to use for controlling emotional eating.  Targeting Your Nutrition Priorities Clinical staff led group instruction and group discussion with PowerPoint presentation and patient guidebook. To enhance the learning environment the use of posters, models and videos may be added. Patients will learn how to determine their genetic susceptibility to disease by reviewing their family history. Patients will gain insight into the importance of diet as part of an overall healthy lifestyle in mitigating the impact of genetics and other environmental insults. The purpose of this lesson is to provide patients with the opportunity to assess their personal nutrition priorities by looking at their family history, their own health history and current risk factors. Patients will also be able to discuss ways of prioritizing and modifying the [place] for their highest risk areas  Menu  Clinical staff led group instruction and group discussion with PowerPoint presentation and patient guidebook. To enhance the learning environment the use of posters, models and videos may be added. Using menus brought in from [employer], or printed from [employer], patients will apply the [place] dining out guidelines that were presented in the [employer] video. Patients will also be able to practice these guidelines in a variety of provided scenarios. The purpose of this lesson is to provide patients with the opportunity to practice hands-on learning of the [place] with actual menus  and practice scenarios.  Label Reading Clinical staff led group  instruction and group discussion with PowerPoint presentation and patient guidebook. To enhance the learning environment the use of posters, models and videos may be added. Patients will review and discuss the Pritikin label reading guidelines presented in Pritikin's Label Reading Educational series video. Using fool labels brought in from local grocery stores and markets, patients will apply the label reading guidelines and determine if the packaged food meet the Pritikin guidelines. The purpose of this lesson is to provide patients with the opportunity to review, discuss, and practice hands-on learning of the Pritikin Label Reading guidelines with actual packaged food labels. [place] Workshops are designed to teach patients ways to prepare quick, simple, and affordable recipes at home. The importance of nutrition's role in chronic disease risk reduction is reflected in its emphasis in the overall Pritikin program. By learning how to prepare essential core Pritikin Eating Plan recipes, patients will increase control over what they eat; be able to customize the flavor of foods without the use of added salt, sugar, or fat; and improve the quality of the food they consume. By learning a set of core recipes which are easily assembled, quickly prepared, and affordable, patients are more likely to prepare more healthy foods at home. These workshops focus on convenient breakfasts, simple entres, side dishes, and desserts which can be prepared with minimal effort and are consistent with nutrition recommendations for cardiovascular risk reduction. Cooking [employer] are taught by a Engineer, materials (RD) who has been trained by the [employer]. The chef or RD has a clear understanding of the importance of minimizing - if not completely eliminating - added fat, sugar, and sodium in recipes. Throughout the series of [place] Workshop sessions, patients  will learn about healthy ingredients and efficient methods of cooking to build confidence in their capability to prepare    Cooking School weekly topics:  Adding Flavor- Sodium-Free  Fast and Healthy Breakfasts  Powerhouse Plant-Based Proteins  Satisfying Salads and Dressings  Simple Sides and Sauces  International Cuisine-Spotlight on the [place] Zones  Delicious Desserts  Savory Soups  Efficiency Cooking - Meals in a Snap  Tasty Appetizers and Snacks  Comforting Weekend Breakfasts  One-Pot Wonders   Fast Evening Meals  Easy [place] (Psychosocial): New Thoughts, New Behaviors Clinical staff led group instruction and group discussion with PowerPoint presentation and patient guidebook. To enhance the learning environment the use of posters, models and videos may be added. Patients will learn and practice techniques for developing effective health and lifestyle goals. Patients will be able to effectively apply the goal setting process learned to develop at least one new personal goal.  The purpose of this lesson is to expose patients to a new skill set of behavior modification techniques such as techniques setting SMART goals, overcoming barriers, and achieving new thoughts and new behaviors.  Managing Moods and Relationships Clinical staff led group instruction and group discussion with PowerPoint presentation and patient guidebook. To enhance the learning environment the use of posters, models and videos may be added. Patients will learn how emotional and chronic stress factors can impact their health and relationships. They will learn healthy ways to manage their moods and utilize positive coping mechanisms. In addition, ICR patients will learn ways to improve communication skills. The purpose of this lesson is to expose patients to ways of understanding  how one's mood and health are intimately connected. Developing a  healthy outlook can help build positive relationships and connections with others. Patients will understand the importance of utilizing effective communication skills that include actively listening and being heard. They will learn and understand the importance of the "4 Cs" and especially Connections in fostering of a Healthy Mind-Set.  Healthy Sleep for a Healthy Heart Clinical staff led group instruction and group discussion with PowerPoint presentation and patient guidebook. To enhance the learning environment the use of posters, models and videos may be added. At the conclusion of this workshop, patients will be able to demonstrate knowledge of the importance of sleep to overall health, well-being, and quality of life. They will understand the symptoms of, and treatments for, common sleep disorders. Patients will also be able to identify daytime and nighttime behaviors which impact sleep, and they will be able to apply these tools to help manage sleep-related challenges. The purpose of this lesson is to provide patients with a general overview of sleep and outline the importance of quality sleep. Patients will learn about a few of the most common sleep disorders. Patients will also be introduced to the concept of "sleep hygiene," and discover ways to self-manage certain sleeping problems through simple daily behavior changes. Finally, the workshop will motivate patients by clarifying the links between quality sleep and their goals of heart-healthy living.   Recognizing and Reducing Stress Clinical staff led group instruction and group discussion with PowerPoint presentation and patient guidebook. To enhance the learning environment the use of posters, models and videos may be added. At the conclusion of this workshop, patients will be able to understand the types of stress reactions, differentiate between acute and chronic stress, and recognize the impact that chronic stress has on their health. They will  also be able to apply different coping mechanisms, such as reframing negative self-talk. Patients will have the opportunity to practice a variety of stress management techniques, such as deep abdominal breathing, progressive muscle relaxation, and/or guided imagery.  The purpose of this lesson is to educate patients on the role of stress in their lives and to provide healthy techniques for coping with it.  Learning Barriers/Preferences:  Learning Barriers/Preferences - 05/17/22 1200       Learning Barriers/Preferences   Learning Barriers Sight;Hearing   wears reading glasses. Has bilateral hearing aides   Learning Preferences Audio;Individual Instruction;Group Instruction;Pictoral;Skilled Demonstration;Verbal Instruction;Video;Written Material             Education Topics:  Knowledge Questionnaire Score:  Knowledge Questionnaire Score - 05/17/22 1144       Knowledge Questionnaire Score   Pre Score 15/24             Core Components/Risk Factors/Patient Goals at Admission:  Personal Goals and Risk Factors at Admission - 05/17/22 1145       Core Components/Risk Factors/Patient Goals on Admission    Weight Management Yes;Weight Maintenance    Intervention Weight Management: Develop a combined nutrition and exercise program designed to reach desired caloric intake, while maintaining appropriate intake of nutrient and fiber, sodium and fats, and appropriate energy expenditure required for the weight goal.;Weight Management: Provide education and appropriate resources to help participant work on and attain dietary goals.    Expected Outcomes Short Term: Continue to assess and modify interventions until short term weight is achieved;Long Term: Adherence to nutrition and physical activity/exercise program aimed toward attainment of established weight goal;Weight Maintenance: Understanding of the daily nutrition guidelines, which includes 25-35%  calories from fat, 7% or less cal from  saturated fats, less than '200mg'$  cholesterol, less than 1.5gm of sodium, & 5 or more servings of fruits and vegetables daily;Understanding recommendations for meals to include 15-35% energy as protein, 25-35% energy from fat, 35-60% energy from carbohydrates, less than '200mg'$  of dietary cholesterol, 20-35 gm of total fiber daily;Understanding of distribution of calorie intake throughout the day with the consumption of 4-5 meals/snacks    Improve shortness of breath with ADL's Yes    Intervention Provide education, individualized exercise plan and daily activity instruction to help decrease symptoms of SOB with activities of daily living.    Expected Outcomes Short Term: Improve cardiorespiratory fitness to achieve a reduction of symptoms when performing ADLs;Long Term: Be able to perform more ADLs without symptoms or delay the onset of symptoms    Diabetes Yes    Intervention Provide education about signs/symptoms and action to take for hypo/hyperglycemia.;Provide education about proper nutrition, including hydration, and aerobic/resistive exercise prescription along with prescribed medications to achieve blood glucose in normal ranges: Fasting glucose 65-99 mg/dL    Expected Outcomes Short Term: Participant verbalizes understanding of the signs/symptoms and immediate care of hyper/hypoglycemia, proper foot care and importance of medication, aerobic/resistive exercise and nutrition plan for blood glucose control.;Long Term: Attainment of HbA1C < 7%.    Hypertension Yes    Intervention Provide education on lifestyle modifcations including regular physical activity/exercise, weight management, moderate sodium restriction and increased consumption of fresh fruit, vegetables, and low fat dairy, alcohol moderation, and smoking cessation.;Monitor prescription use compliance.    Expected Outcomes Short Term: Continued assessment and intervention until BP is < 140/16m HG in hypertensive participants. < 130/810mHG  in hypertensive participants with diabetes, heart failure or chronic kidney disease.;Long Term: Maintenance of blood pressure at goal levels.    Lipids Yes    Intervention Provide education and support for participant on nutrition & aerobic/resistive exercise along with prescribed medications to achieve LDL '70mg'$ , HDL >'40mg'$ .    Expected Outcomes Short Term: Participant states understanding of desired cholesterol values and is compliant with medications prescribed. Participant is following exercise prescription and nutrition guidelines.;Long Term: Cholesterol controlled with medications as prescribed, with individualized exercise RX and with personalized nutrition plan. Value goals: LDL < '70mg'$ , HDL > 40 mg.    Personal Goal Other Yes    Personal Goal Loosen joints.    Intervention Provide individualized exercise prescription including aerobic, resistance, and stretching exercises to help improve flexibility/ loosen joints.    Expected Outcomes Improved flexibility/ decreased stiffness in joints as measured by sit-and-reach test and self report.             Core Components/Risk Factors/Patient Goals Review:    Core Components/Risk Factors/Patient Goals at Discharge (Final Review):    ITP Comments:  ITP Comments     Row Name 05/17/22 0909           ITP Comments Medical Director- Dr. TrFransico HimMD. Introduction to [facility]/ Intensive Cardiac Rehab. Inital Orientation Packet Reviewed with the patient                Comments: Participant attended orientation for the cardiac rehabilitation program on  05/17/2022  to perform initial intake and exercise walk test. Patient introduced to the [facility] and orientation packet was reviewed. Completed 6-minute walk test, measurements, initial ITP, and exercise prescription. Vital signs stable. Telemetry- sinus bradycardia, asymptomatic.   Service time was from 909 to 1119.

## 2022-05-17 NOTE — Progress Notes (Signed)
Cardiac Rehab Medication Review by a Nurse  Does the patient  feel that his/her medications are working for him/her?  yes  Has the patient been experiencing any side effects to the medications prescribed?  no  Does the patient measure his/her own blood pressure or blood glucose at home?  yes   Does the patient have any problems obtaining medications due to transportation or finances?   no  Understanding of regimen: good Understanding of indications: good Potential of compliance: good    Nurse comments: [name] is taking his medications as prescribed. [name] checks his CBG's 3 times a day. [name] has a BP cuff/monitor he does not check his blood pressures on a daily basis.     Christa See [facility] RN 05/17/2022 11:56 AM

## 2022-05-17 NOTE — Progress Notes (Signed)
Patient here for cardiac rehab orientation. Nonsustained sinus brady noted rate as low as 47. Asymptomatic. Will notify Dr Kingsley Plan patient asymptomatic. Blood pressure 106/70. Medications reviewed. Will forward to Dr Kingsley Plan for review.Harrell Gave RN BSN

## 2022-05-25 ENCOUNTER — Encounter (HOSPITAL_COMMUNITY)
Admission: RE | Admit: 2022-05-25 | Discharge: 2022-05-25 | Disposition: A | Discharge status: Home / Self Care | Payer: Medicare Other | Source: Ambulatory Visit | Attending: Cardiology | Admitting: Cardiology

## 2022-05-25 DIAGNOSIS — Z951 Presence of aortocoronary bypass graft: Secondary | ICD-10-CM | POA: Diagnosis not present

## 2022-05-25 DIAGNOSIS — Z48812 Encounter for surgical aftercare following surgery on the circulatory system: Secondary | ICD-10-CM | POA: Diagnosis not present

## 2022-05-25 LAB — GLUCOSE, CAPILLARY
Glucose-Capillary: 172 mg/dL — ABNORMAL HIGH (ref 70–99)
Glucose-Capillary: 132 mg/dL — ABNORMAL HIGH (ref 70–99)

## 2022-05-25 NOTE — Progress Notes (Signed)
Daily Session Note  Patient Details  Name: [name] MRN: [id number] Date of Birth: [birth year] Referring Provider:   Flowsheet Row INTENSIVE CARDIAC REHAB ORIENT from 05/17/2022 in [facility] [place]  Referring Provider Jerline Pain, MD       Encounter Date: 05/25/2022  Check In:  Session Check In - 05/25/22 1507       Check-In   Supervising physician immediately available to respond to emergencies [facility] - Physician supervision    Physician(s) Dr. Julianne Handler    Location [facility]    Staff Present [name], MS, ACSM-CEP, Exercise Physiologist;Zymir Napoli, RN, Milus Glazier, MS, ACSM-CEP, CCRP, Exercise Physiologist;Bailey Pearline Cables, MS, Exercise Physiologist;Jetta Walker BS, ACSM-CEP, Exercise Physiologist    Virtual Visit No    Medication changes reported     No    Fall or balance concerns reported    No    Tobacco Cessation No Change    Warm-up and Cool-down Performed as group-led instruction    Resistance Training Performed No    VAD Patient? No    PAD/SET Patient? No      Pain Assessment   Currently in Pain? No/denies    Pain Score 0-No pain    Multiple Pain Sites No             Capillary Blood Glucose: Results for orders placed or performed during the hospital encounter of 05/25/22 (from the past 24 hour(s))  Glucose, capillary     Status: Abnormal   Collection Time: 05/25/22  2:59 PM  Result Value Ref Range   Glucose-Capillary 172 (H) 70 - 99 mg/dL  Glucose, capillary     Status: Abnormal   Collection Time: 05/25/22  3:42 PM  Result Value Ref Range   Glucose-Capillary 132 (H) 70 - 99 mg/dL     Exercise Prescription Changes - 05/25/22 1619       Response to Exercise   Blood Pressure (Admit) 122/82    Blood Pressure (Exercise) 132/64    Blood Pressure (Exit) 126/76    Heart Rate (Admit) 69 bpm    Heart Rate (Exercise) 80 bpm    Heart Rate (Exit) 63 bpm    Rating  of Perceived Exertion (Exercise) 12    Perceived Dyspnea (Exercise) 0    Symptoms None    Comments Pt first day in the CRP2 prorgam    Duration Progress to 30 minutes of  aerobic without signs/symptoms of physical distress    Intensity THRR unchanged      Progression   Progression Continue to progress workloads to maintain intensity without signs/symptoms of physical distress.    Average METs 1.55      Resistance Training   Training Prescription No      NuStep   Level 2    SPM 63    Minutes 15    METs 1.8      Recumbant Elliptical   Level 1    RPM 33    Watts 29    Minutes 15    METs 1.3             Social History   Tobacco Use  Smoking Status Former   Packs/day: 1.00   Years: 35.00   Total pack years: 35.00   Types: Cigarettes   Quit date: 70   Years since quitting: 36.0  Smokeless Tobacco Never    Goals Met:  Exercise tolerated well No report of concerns or symptoms  today  Goals Unmet:  Not Applicable  Comments: Pt started cardiac rehab today.  Pt tolerated light exercise without difficulty. VSS, telemetry-Sinus Rhythm, asymptomatic.  Medication list reconciled. Pt denies barriers to medicaiton compliance.  PSYCHOSOCIAL ASSESSMENT:  PHQ-0. Pt exhibits positive coping skills, hopeful outlook with supportive family. No psychosocial needs identified at this time, no psychosocial interventions necessary.    Pt enjoys wood.   Pt oriented to exercise equipment and routine.  No bradycardia was noted today during exercise.  Understanding verbalized.Harrell Gave RN BSN    Dr. Fransico Him is Medical Director for Cardiac Rehab at [facility].

## 2022-05-27 ENCOUNTER — Encounter (HOSPITAL_COMMUNITY)
Admission: RE | Admit: 2022-05-27 | Discharge: 2022-05-27 | Disposition: A | Discharge status: Home / Self Care | Payer: Medicare Other | Source: Ambulatory Visit | Attending: Cardiology | Admitting: Cardiology

## 2022-05-27 DIAGNOSIS — Z48812 Encounter for surgical aftercare following surgery on the circulatory system: Secondary | ICD-10-CM | POA: Diagnosis not present

## 2022-05-27 DIAGNOSIS — Z951 Presence of aortocoronary bypass graft: Secondary | ICD-10-CM

## 2022-05-31 NOTE — Progress Notes (Signed)
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 with verbal and written material and guidebook.  Patient learns that lifestyle modifications can be just as effective, if not more so, as many medications for lowering your risk of heart disease. A Pritikin lifestyle can help to reduce your risk of inflammation and atherosclerosis (cholesterol build-up, or plaque, in the artery walls). Lifestyle interventions such as  dietary choices and physical activity address the cause of atherosclerosis. A review of the types of fats and their impact on blood cholesterol levels, along with dietary recommendations to reduce fat intake is also included.  Nutrition Action Plan  Clinical staff conducted group or individual video education with verbal and written material and guidebook.  Patient learns how to incorporate Pritikin recommendations into their lifestyle. Recommendations include planning and keeping personal health goals in mind as an important part of their success.  Healthy Mind-Set    Healthy Minds, Bodies, Hearts  Clinical staff conducted group or individual video education with verbal and written material and guidebook.  Patient learns how to identify when they are stressed. Video will discuss the impact of that stress, as well as the many benefits of stress management. Patient will also be introduced to stress management techniques. The way we think, act, and feel has an impact on our hearts.  How Our Thoughts Can Heal Our Hearts  Clinical staff conducted group or individual video education with verbal and written material and guidebook.  Patient learns that negative thoughts can cause depression and anxiety. This can result in negative lifestyle behavior and serious health problems. Cognitive behavioral therapy is an effective method to help control our thoughts in order to change and improve our emotional outlook.  Additional Videos:  Exercise    Improving Performance  Clinical staff conducted group or individual video education with verbal and written material and guidebook.  Patient learns to use a non-linear approach by alternating intensity levels and lengths of time spent exercising to help burn more calories and lose more body fat. Cardiovascular exercise helps improve heart health, metabolism, hormonal balance, blood sugar control, and recovery from fatigue. Resistance training improves strength,  endurance, balance, coordination, reaction time, metabolism, and muscle mass. Flexibility exercise improves circulation, posture, and balance. Seek guidance from your physician and exercise physiologist before implementing an exercise routine and learn your capabilities and proper form for all exercise.  Introduction to Yoga  Clinical staff conducted group or individual video education with verbal and written material and guidebook.  Patient learns about yoga, a discipline of the coming together of mind, breath, and body. The benefits of yoga include improved flexibility, improved range of motion, better posture and core strength, increased lung function, weight loss, and positive self-image. Yoga's heart health benefits include lowered blood pressure, healthier heart rate, decreased cholesterol and triglyceride levels, improved immune function, and reduced stress. Seek guidance from your physician and exercise physiologist before implementing an exercise routine and learn your capabilities and proper form for all exercise.  Medical   Aging: Enhancing Your Quality of Life  Clinical staff conducted group or individual video education with verbal and written material and guidebook.  Patient learns key strategies and recommendations to stay in good physical health and enhance quality of life, such as prevention strategies, having an advocate, securing a [place], and keeping a list of medications and system for tracking them. It  also discusses how to avoid risk for bone loss.  Biology of Weight Control  Clinical staff conducted group or individual video education with verbal and written material and guidebook.  Patient learns that weight gain occurs because we consume more calories than we burn (eating more, moving less). Even if your body weight is normal, you may have higher ratios of fat compared to muscle mass. Too much body fat puts you at increased risk for  cardiovascular disease, heart attack, stroke, type 2 diabetes, and obesity-related cancers. In addition to exercise, following the [place] can help reduce your risk.  Decoding Lab Results  Clinical staff conducted group or individual video education with verbal and written material and guidebook.  Patient learns that lab test reflects one measurement whose values change over time and are influenced by many factors, including medication, stress, sleep, exercise, food, hydration, pre-existing medical conditions, and more. It is recommended to use the knowledge from this video to become more involved with your lab results and evaluate your numbers to speak with your doctor.   Diseases of Our Time - Overview  Clinical staff conducted group or individual video education with verbal and written material and guidebook.  Patient learns that according to the CDC, 50% to 70% of chronic diseases (such as obesity, type 2 diabetes, elevated lipids, hypertension, and heart disease) are avoidable through lifestyle improvements including healthier food choices, listening to satiety cues, and increased physical activity.  Sleep Disorders Clinical staff conducted group or individual video education with verbal and written material and guidebook.  Patient learns how good quality and duration of sleep are important to overall health and well-being. Patient also learns about sleep disorders and how they impact health along with recommendations to address them, including discussing with a physician.  Nutrition  Dining Out - Part 2 Clinical staff conducted group or individual video education with verbal and written material and guidebook.  Patient learns how to plan ahead and communicate in order to maximize their dining experience in a healthy and nutritious manner. Included are recommended food choices based on the type of restaurant the patient is visiting.   Fueling a Best boy  conducted group or individual video education with verbal and written material and guidebook.  There is a strong connection between our food choices and our health. Diseases like obesity and type 2 diabetes are very prevalent and are in large-part due to lifestyle choices. The Pritikin Eating Plan provides plenty of food and hunger-curbing satisfaction. It is easy to follow, affordable, and helps reduce health risks.  Menu Workshop  Clinical staff conducted group or individual video education with verbal and written material and guidebook.  Patient learns that restaurant meals can sabotage health goals because they are often packed with calories, fat, sodium, and sugar. Recommendations include strategies to plan ahead and to communicate with the manager, chef, or server to help order a healthier meal.  Planning Your Eating Strategy  Clinical staff conducted group or individual video education with verbal and written material and guidebook.  Patient learns about the [place] and its benefit of reducing the risk of disease. The [place] does not focus on calories. Instead, it emphasizes high-quality, nutrient-rich foods. By knowing the characteristics of the foods, we choose, we can determine their calorie density and make informed decisions.  Targeting Your Nutrition Priorities  Clinical staff conducted group or individual video education with verbal and written material and guidebook.  Patient learns that  lifestyle habits have a tremendous impact on disease risk and progression. This video provides eating and physical activity recommendations based on your personal health goals, such as reducing LDL cholesterol, losing weight, preventing or controlling type 2 diabetes, and reducing high blood pressure.  Vitamins and Minerals  Clinical staff conducted group or individual video education with verbal and written material and guidebook.  Patient learns different ways to obtain  key vitamins and minerals, including through a recommended healthy diet. It is important to discuss all supplements you take with your doctor.   Healthy Mind-Set    Smoking Cessation  Clinical staff conducted group or individual video education with verbal and written material and guidebook.  Patient learns that cigarette smoking and tobacco addiction pose a serious health risk which affects millions of people. Stopping smoking will significantly reduce the risk of heart disease, lung disease, and many forms of cancer. Recommended strategies for quitting are covered, including working with your doctor to develop a successful plan.  Culinary   Becoming a Financial trader conducted group or individual video education with verbal and written material and guidebook.  Patient learns that cooking at home can be healthy, cost-effective, quick, and puts them in control. Keys to cooking healthy recipes will include looking at your recipe, assessing your equipment needs, planning ahead, making it simple, choosing cost-effective seasonal ingredients, and limiting the use of added fats, salts, and sugars.  Cooking - Breakfast and Snacks  Clinical staff conducted group or individual video education with verbal and written material and guidebook.  Patient learns how important breakfast is to satiety and nutrition through the entire day. Recommendations include key foods to eat during breakfast to help stabilize blood sugar levels and to prevent overeating at meals later in the day. Planning ahead is also a key component.  Cooking - Human resources officer conducted group or individual video education with verbal and written material and guidebook.  Patient learns eating strategies to improve overall health, including an approach to cook more at home. Recommendations include thinking of animal protein as a side on your plate rather than center stage and focusing instead on lower calorie  dense options like vegetables, fruits, whole grains, and plant-based proteins, such as beans. Making sauces in large quantities to freeze for later and leaving the skin on your vegetables are also recommended to maximize your experience.  Cooking - Healthy Salads and Dressing Clinical staff conducted group or individual video education with verbal and written material and guidebook.  Patient learns that vegetables, fruits, whole grains, and legumes are the foundations of the [place]. Recommendations include how to incorporate each of these in flavorful and healthy salads, and how to create homemade salad dressings. Proper handling of ingredients is also covered. Cooking - Soups and [employer] - Soups and Desserts Clinical staff conducted group or individual video education with verbal and written material and guidebook.  Patient learns that Pritikin soups and desserts make for easy, nutritious, and delicious snacks and meal components that are low in sodium, fat, sugar, and calorie density, while high in vitamins, minerals, and filling fiber. Recommendations include simple and healthy ideas for soups and desserts.   Overview     The Pritikin Solution Program Overview Clinical staff conducted group or individual video education with verbal and written material and guidebook.  Patient learns that the results of the [place] Program have been documented in more than 100 articles published in peer-reviewed journals, and  the benefits include reducing risk factors for (and, in some cases, even reversing) high cholesterol, high blood pressure, type 2 diabetes, obesity, and more! An overview of the three key pillars of the Pritikin Program will be covered: eating well, doing regular exercise, and having a healthy mind-set.  WORKSHOPS  Exercise: Exercise Basics: Building Your Action Plan Clinical staff led group instruction and group discussion with PowerPoint presentation and patient  guidebook. To enhance the learning environment the use of posters, models and videos may be added. At the conclusion of this workshop, patients will comprehend the difference between physical activity and exercise, as well as the benefits of incorporating both, into their routine. Patients will understand the FITT (Frequency, Intensity, Time, and Type) principle and how to use it to build an exercise action plan. In addition, safety concerns and other considerations for exercise and cardiac rehab will be addressed by the presenter. The purpose of this lesson is to promote a comprehensive and effective weekly exercise routine in order to improve patients' overall level of fitness.   Managing Heart Disease: Your Path to a Healthier Heart Clinical staff led group instruction and group discussion with PowerPoint presentation and patient guidebook. To enhance the learning environment the use of posters, models and videos may be added.At the conclusion of this workshop, patients will understand the anatomy and physiology of the heart. Additionally, they will understand how Pritikin's three pillars impact the risk factors, the progression, and the management of heart disease.  The purpose of this lesson is to provide a high-level overview of the heart, heart disease, and how the Pritikin lifestyle positively impacts risk factors.  Exercise Biomechanics Clinical staff led group instruction and group discussion with PowerPoint presentation and patient guidebook. To enhance the learning environment the use of posters, models and videos may be added. Patients will learn how the structural parts of their bodies function and how these functions impact their daily activities, movement, and exercise. Patients will learn how to promote a neutral spine, learn how to manage pain, and identify ways to improve their physical movement in order to promote healthy living. The purpose of this lesson is to expose patients  to common physical limitations that impact physical activity. Participants will learn practical ways to adapt and manage aches and pains, and to minimize their effect on regular exercise. Patients will learn how to maintain good posture while sitting, walking, and lifting.  Balance Training and Fall Prevention  Clinical staff led group instruction and group discussion with PowerPoint presentation and patient guidebook. To enhance the learning environment the use of posters, models and videos may be added. At the conclusion of this workshop, patients will understand the importance of their sensorimotor skills (vision, proprioception, and the vestibular system) in maintaining their ability to balance as they age. Patients will apply a variety of balancing exercises that are appropriate for their current level of function. Patients will understand the common causes for poor balance, possible solutions to these problems, and ways to modify their physical environment in order to minimize their fall risk. The purpose of this lesson is to teach patients about the importance of maintaining balance as they age and ways to minimize their risk of falling.  WORKSHOPS   Nutrition:  Fueling a Scientist, research (physical sciences) led group instruction and group discussion with PowerPoint presentation and patient guidebook. To enhance the learning environment the use of posters, models and videos may be added. Patients will review the foundational principles of the [place]  and understand what constitutes a serving size in each of the food groups. Patients will also learn Pritikin-friendly foods that are better choices when away from home and review make-ahead meal and snack options. Calorie density will be reviewed and applied to three nutrition priorities: weight maintenance, weight loss, and weight gain. The purpose of this lesson is to reinforce (in a group setting) the key concepts around what patients are  recommended to eat and how to apply these guidelines when away from home by planning and selecting Pritikin-friendly options. Patients will understand how calorie density may be adjusted for different weight management goals.  Mindful Eating  Clinical staff led group instruction and group discussion with PowerPoint presentation and patient guidebook. To enhance the learning environment the use of posters, models and videos may be added. Patients will briefly review the concepts of the [place] and the importance of low-calorie dense foods. The concept of mindful eating will be introduced as well as the importance of paying attention to internal hunger signals. Triggers for non-hunger eating and techniques for dealing with triggers will be explored. The purpose of this lesson is to provide patients with the opportunity to review the basic principles of the [place], discuss the value of eating mindfully and how to measure internal cues of hunger and fullness using the Hunger Scale. Patients will also discuss reasons for non-hunger eating and learn strategies to use for controlling emotional eating.  Targeting Your Nutrition Priorities Clinical staff led group instruction and group discussion with PowerPoint presentation and patient guidebook. To enhance the learning environment the use of posters, models and videos may be added. Patients will learn how to determine their genetic susceptibility to disease by reviewing their family history. Patients will gain insight into the importance of diet as part of an overall healthy lifestyle in mitigating the impact of genetics and other environmental insults. The purpose of this lesson is to provide patients with the opportunity to assess their personal nutrition priorities by looking at their family history, their own health history and current risk factors. Patients will also be able to discuss ways of prioritizing and modifying the [place] for their highest risk areas  Menu  Clinical staff led group instruction and group discussion with PowerPoint presentation and patient guidebook. To enhance the learning environment the use of posters, models and videos may be added. Using menus brought in from [employer], or printed from [employer], patients will apply the [place] dining out guidelines that were presented in the [employer] video. Patients will also be able to practice these guidelines in a variety of provided scenarios. The purpose of this lesson is to provide patients with the opportunity to practice hands-on learning of the [place] with actual menus and practice scenarios.  Label Reading Clinical staff led group instruction and group discussion with PowerPoint presentation and patient guidebook. To enhance the learning environment the use of posters, models and videos may be added. Patients will review and discuss the Pritikin label reading guidelines presented in Pritikin's Label Reading Educational series video. Using fool labels brought in from local grocery stores and markets, patients will apply the label reading guidelines and determine if the packaged food meet the Pritikin guidelines. The purpose of this lesson is to provide patients with the opportunity to review, discuss, and practice hands-on learning of the Pritikin Label Reading guidelines with actual packaged food labels. [place] Workshops are designed  to teach patients ways to prepare quick, simple, and affordable recipes at home. The importance of nutrition's role in chronic disease risk reduction is reflected in its emphasis in the overall Pritikin program. By learning how to prepare essential core Pritikin Eating Plan recipes, patients will increase control over what they eat; be able to customize the flavor of foods without the use of added salt, sugar, or fat; and  improve the quality of the food they consume. By learning a set of core recipes which are easily assembled, quickly prepared, and affordable, patients are more likely to prepare more healthy foods at home. These workshops focus on convenient breakfasts, simple entres, side dishes, and desserts which can be prepared with minimal effort and are consistent with nutrition recommendations for cardiovascular risk reduction. Cooking [employer] are taught by a Engineer, materials (RD) who has been trained by the [employer]. The chef or RD has a clear understanding of the importance of minimizing - if not completely eliminating - added fat, sugar, and sodium in recipes. Throughout the series of [place] Workshop sessions, patients will learn about healthy ingredients and efficient methods of cooking to build confidence in their capability to prepare    Cooking School weekly topics:  Adding Flavor- Sodium-Free  Fast and Healthy Breakfasts  Powerhouse Plant-Based Proteins  Satisfying Salads and Dressings  Simple Sides and Sauces  International Cuisine-Spotlight on the [place] Zones  Delicious Desserts  Savory Soups  Efficiency Cooking - Meals in a Snap  Tasty Appetizers and Snacks  Comforting Weekend Breakfasts  One-Pot Wonders   Fast Evening Meals  Easy [place] (Psychosocial): New Thoughts, New Behaviors Clinical staff led group instruction and group discussion with PowerPoint presentation and patient guidebook. To enhance the learning environment the use of posters, models and videos may be added. Patients will learn and practice techniques for developing effective health and lifestyle goals. Patients will be able to effectively apply the goal setting process learned to develop at least one new personal goal.  The purpose of this lesson is to expose patients to a new skill set of behavior  modification techniques such as techniques setting SMART goals, overcoming barriers, and achieving new thoughts and new behaviors.  Managing Moods and Relationships Clinical staff led group instruction and group discussion with PowerPoint presentation and patient guidebook. To enhance the learning environment the use of posters, models and videos may be added. Patients will learn how emotional and chronic stress factors can impact their health and relationships. They will learn healthy ways to manage their moods and utilize positive coping mechanisms. In addition, ICR patients will learn ways to improve communication skills. The purpose of this lesson is to expose patients to ways of understanding how one's mood and health are intimately connected. Developing a healthy outlook can help build positive relationships and connections with others. Patients will understand the importance of utilizing effective communication skills that include actively listening and being heard. They will learn and understand the importance of the "4 Cs" and especially Connections in fostering of a Healthy Mind-Set.  Healthy Sleep for a Healthy Heart Clinical staff led group instruction and group discussion with PowerPoint presentation and patient guidebook. To enhance the learning environment the use of posters, models and videos may be added. At the conclusion of this workshop, patients will be able to demonstrate knowledge of the importance of sleep to overall health, well-being, and quality of life.  They will understand the symptoms of, and treatments for, common sleep disorders. Patients will also be able to identify daytime and nighttime behaviors which impact sleep, and they will be able to apply these tools to help manage sleep-related challenges. The purpose of this lesson is to provide patients with a general overview of sleep and outline the importance of quality sleep. Patients will learn about a few of the most common  sleep disorders. Patients will also be introduced to the concept of "sleep hygiene," and discover ways to self-manage certain sleeping problems through simple daily behavior changes. Finally, the workshop will motivate patients by clarifying the links between quality sleep and their goals of heart-healthy living.   Recognizing and Reducing Stress Clinical staff led group instruction and group discussion with PowerPoint presentation and patient guidebook. To enhance the learning environment the use of posters, models and videos may be added. At the conclusion of this workshop, patients will be able to understand the types of stress reactions, differentiate between acute and chronic stress, and recognize the impact that chronic stress has on their health. They will also be able to apply different coping mechanisms, such as reframing negative self-talk. Patients will have the opportunity to practice a variety of stress management techniques, such as deep abdominal breathing, progressive muscle relaxation, and/or guided imagery.  The purpose of this lesson is to educate patients on the role of stress in their lives and to provide healthy techniques for coping with it.  Learning Barriers/Preferences:  Learning Barriers/Preferences - 05/17/22 1200       Learning Barriers/Preferences   Learning Barriers Sight;Hearing   wears reading glasses. Has bilateral hearing aides   Learning Preferences Audio;Individual Instruction;Group Instruction;Pictoral;Skilled Demonstration;Verbal Instruction;Video;Written Material             Education Topics:  Knowledge Questionnaire Score:  Knowledge Questionnaire Score - 05/17/22 1144       Knowledge Questionnaire Score   Pre Score 15/24             Core Components/Risk Factors/Patient Goals at Admission:  Personal Goals and Risk Factors at Admission - 05/17/22 1145       Core Components/Risk Factors/Patient Goals on Admission    Weight Management  Yes;Weight Maintenance    Intervention Weight Management: Develop a combined nutrition and exercise program designed to reach desired caloric intake, while maintaining appropriate intake of nutrient and fiber, sodium and fats, and appropriate energy expenditure required for the weight goal.;Weight Management: Provide education and appropriate resources to help participant work on and attain dietary goals.    Expected Outcomes Short Term: Continue to assess and modify interventions until short term weight is achieved;Long Term: Adherence to nutrition and physical activity/exercise program aimed toward attainment of established weight goal;Weight Maintenance: Understanding of the daily nutrition guidelines, which includes 25-35% calories from fat, 7% or less cal from saturated fats, less than 255m cholesterol, less than 1.5gm of sodium, & 5 or more servings of fruits and vegetables daily;Understanding recommendations for meals to include 15-35% energy as protein, 25-35% energy from fat, 35-60% energy from carbohydrates, less than 2035mof dietary cholesterol, 20-35 gm of total fiber daily;Understanding of distribution of calorie intake throughout the day with the consumption of 4-5 meals/snacks    Improve shortness of breath with ADL's Yes    Intervention Provide education, individualized exercise plan and daily activity instruction to help decrease symptoms of SOB with activities of daily living.    Expected Outcomes Short Term: Improve cardiorespiratory fitness to achieve a reduction  of symptoms when performing ADLs;Long Term: Be able to perform more ADLs without symptoms or delay the onset of symptoms    Diabetes Yes    Intervention Provide education about signs/symptoms and action to take for hypo/hyperglycemia.;Provide education about proper nutrition, including hydration, and aerobic/resistive exercise prescription along with prescribed medications to achieve blood glucose in normal ranges: Fasting  glucose 65-99 mg/dL    Expected Outcomes Short Term: Participant verbalizes understanding of the signs/symptoms and immediate care of hyper/hypoglycemia, proper foot care and importance of medication, aerobic/resistive exercise and nutrition plan for blood glucose control.;Long Term: Attainment of HbA1C < 7%.    Hypertension Yes    Intervention Provide education on lifestyle modifcations including regular physical activity/exercise, weight management, moderate sodium restriction and increased consumption of fresh fruit, vegetables, and low fat dairy, alcohol moderation, and smoking cessation.;Monitor prescription use compliance.    Expected Outcomes Short Term: Continued assessment and intervention until BP is < 140/37m HG in hypertensive participants. < 130/821mHG in hypertensive participants with diabetes, heart failure or chronic kidney disease.;Long Term: Maintenance of blood pressure at goal levels.    Lipids Yes    Intervention Provide education and support for participant on nutrition & aerobic/resistive exercise along with prescribed medications to achieve LDL <7087mHDL >21m36m  Expected Outcomes Short Term: Participant states understanding of desired cholesterol values and is compliant with medications prescribed. Participant is following exercise prescription and nutrition guidelines.;Long Term: Cholesterol controlled with medications as prescribed, with individualized exercise RX and with personalized nutrition plan. Value goals: LDL < 70mg33mL > 40 mg.    Personal Goal Other Yes    Personal Goal Loosen joints.    Intervention Provide individualized exercise prescription including aerobic, resistance, and stretching exercises to help improve flexibility/ loosen joints.    Expected Outcomes Improved flexibility/ decreased stiffness in joints as measured by sit-and-reach test and self report.             Core Components/Risk Factors/Patient Goals Review:   Goals and Risk Factor  Review     Row Name 05/25/22 1653 05/31/22 1549           Core Components/Risk Factors/Patient Goals Review   Personal Goals Review Weight Management/Obesity;Improve shortness of breath with ADL's;Diabetes;Lipids Weight Management/Obesity;Improve shortness of breath with ADL's;Diabetes;Lipids      Review OrvilJerented intensive cardiac rehab on 05/25/22. OrvilLeronewell with exercise, vital signs and CBG's were stable OrvilCurlieted intensive cardiac rehab on 05/25/22. OrvilJayanthff to a good start to  exercise, vital signs and CBG's have been stable. Imari's heart rate has been improved since his metoprolol has been decreased      Expected Outcomes OrvilWojciech continue to particiapte in intensive cardiac rehab for exercise, nutrition and lifestyle modifications OrvilFinley continue to particiapte in intensive cardiac rehab for exercise, nutrition and lifestyle modifications               Core Components/Risk Factors/Patient Goals at Discharge (Final Review):   Goals and Risk Factor Review - 05/31/22 1549       Core Components/Risk Factors/Patient Goals Review   Personal Goals Review Weight Management/Obesity;Improve shortness of breath with ADL's;Diabetes;Lipids    Review OrvilJordieted intensive cardiac rehab on 05/25/22. OrvilKortff to a good start to  exercise, vital signs and CBG's have been stable. Lycan's heart rate has been improved since his metoprolol has been decreased    Expected Outcomes OrvilOmir continue to particiapte in intensive cardiac rehab  for exercise, nutrition and lifestyle modifications             ITP Comments:  ITP Comments     Row Name 05/17/22 0909 05/25/22 1651 05/31/22 1548       ITP Comments Medical Director- Dr. Fransico Him, MD. Introduction to [facility] Education Program/ Intensive Cardiac Rehab. Inital Orientation Packet Reviewed with the patient 30 Day ITP Review. Jceon started intensive cardiac rehab on 05/25/22. Trevione  did well with exercise for his fitness level 30 Day ITP Review. Imri started intensive cardiac rehab on 05/25/22 and is off to a good start to exercise              Comments: See ITP comments.Harrell Gave RN BSN

## 2022-06-01 ENCOUNTER — Encounter (HOSPITAL_COMMUNITY)
Admission: RE | Admit: 2022-06-01 | Discharge: 2022-06-01 | Disposition: A | Discharge status: Home / Self Care | Payer: Medicare Other | Source: Ambulatory Visit | Attending: Cardiology | Admitting: Cardiology

## 2022-06-01 DIAGNOSIS — Z951 Presence of aortocoronary bypass graft: Secondary | ICD-10-CM | POA: Diagnosis not present

## 2022-06-01 DIAGNOSIS — Z48812 Encounter for surgical aftercare following surgery on the circulatory system: Secondary | ICD-10-CM | POA: Diagnosis not present

## 2022-06-01 LAB — GLUCOSE, CAPILLARY
Glucose-Capillary: 169 mg/dL — ABNORMAL HIGH (ref 70–99)
Glucose-Capillary: 134 mg/dL — ABNORMAL HIGH (ref 70–99)

## 2022-06-02 ENCOUNTER — Ambulatory Visit: Payer: Medicare Other | Admitting: Podiatry

## 2022-06-02 ENCOUNTER — Encounter: Payer: Self-pay | Admitting: Podiatry

## 2022-06-02 VITALS — BP 142/70 | HR 57

## 2022-06-02 DIAGNOSIS — M79676 Pain in unspecified toe(s): Secondary | ICD-10-CM | POA: Diagnosis not present

## 2022-06-02 DIAGNOSIS — D689 Coagulation defect, unspecified: Secondary | ICD-10-CM | POA: Diagnosis not present

## 2022-06-02 DIAGNOSIS — B351 Tinea unguium: Secondary | ICD-10-CM | POA: Diagnosis not present

## 2022-06-02 DIAGNOSIS — E1142 Type 2 diabetes mellitus with diabetic polyneuropathy: Secondary | ICD-10-CM

## 2022-06-02 NOTE — Progress Notes (Signed)
[name]

## 2022-06-03 ENCOUNTER — Encounter (HOSPITAL_COMMUNITY)
Admission: RE | Admit: 2022-06-03 | Discharge: 2022-06-03 | Disposition: A | Discharge status: Home / Self Care | Payer: Medicare Other | Source: Ambulatory Visit | Attending: Cardiology | Admitting: Cardiology

## 2022-06-03 DIAGNOSIS — Z951 Presence of aortocoronary bypass graft: Secondary | ICD-10-CM | POA: Diagnosis not present

## 2022-06-03 DIAGNOSIS — Z48812 Encounter for surgical aftercare following surgery on the circulatory system: Secondary | ICD-10-CM | POA: Diagnosis not present

## 2022-06-05 NOTE — Progress Notes (Signed)
Presents today chief complaint of painful elongated toenails numbness and tingling in his feet states that his right foot seems to be a little more symptomatic recently than the left.  Objective: Vital signs stable oriented x 3 toenails are long thick yellow dystrophic onychomycotic no changes in his neurovascular status.  Assessment Painful elongated toenails.  Plan: Debrided nails 1 through 5 bilateral covered service secondary to pain diabetic peripheral neuropathy and the use of blood thinners.

## 2022-06-06 ENCOUNTER — Encounter (HOSPITAL_COMMUNITY)
Admission: RE | Admit: 2022-06-06 | Discharge: 2022-06-06 | Disposition: A | Discharge status: Home / Self Care | Payer: Medicare Other | Source: Ambulatory Visit | Attending: Cardiology | Admitting: Cardiology

## 2022-06-06 DIAGNOSIS — Z951 Presence of aortocoronary bypass graft: Secondary | ICD-10-CM

## 2022-06-06 DIAGNOSIS — Z48812 Encounter for surgical aftercare following surgery on the circulatory system: Secondary | ICD-10-CM | POA: Diagnosis not present

## 2022-06-08 ENCOUNTER — Encounter (HOSPITAL_COMMUNITY)
Admission: RE | Admit: 2022-06-08 | Discharge: 2022-06-08 | Disposition: A | Discharge status: Home / Self Care | Payer: Medicare Other | Source: Ambulatory Visit | Attending: Cardiology | Admitting: Cardiology

## 2022-06-08 DIAGNOSIS — Z48812 Encounter for surgical aftercare following surgery on the circulatory system: Secondary | ICD-10-CM | POA: Diagnosis not present

## 2022-06-08 DIAGNOSIS — Z951 Presence of aortocoronary bypass graft: Secondary | ICD-10-CM

## 2022-06-10 ENCOUNTER — Encounter (HOSPITAL_COMMUNITY)
Admission: RE | Admit: 2022-06-10 | Discharge: 2022-06-10 | Disposition: A | Discharge status: Home / Self Care | Payer: Medicare Other | Source: Ambulatory Visit | Attending: Cardiology | Admitting: Cardiology

## 2022-06-10 DIAGNOSIS — Z951 Presence of aortocoronary bypass graft: Secondary | ICD-10-CM

## 2022-06-10 DIAGNOSIS — Z48812 Encounter for surgical aftercare following surgery on the circulatory system: Secondary | ICD-10-CM | POA: Diagnosis not present

## 2022-06-10 NOTE — Progress Notes (Signed)
CARDIAC REHAB PHASE 2  Reviewed home exercise with pt today. Pt is tolerating exercise well. Pt will continue to exercise on his own by walking, using hand weights and going to the community gym for 30-45 minutes per session 2-4 days a week in addition to the 3 days in [facility]. Advised pt on THRR, RPE scale, hydration and temperature/humidity precautions. Reinforced NTG use, S/S to stop exercise and when to call MD vs 911. Encouraged warm up cool down and stretches with exercise sessions. Pt verbalized understanding, all questions were answered and pt was given a copy to take home.    [name] ACSM-CEP 06/10/2022 4:48 PM

## 2022-06-13 ENCOUNTER — Encounter (HOSPITAL_COMMUNITY)
Admission: RE | Admit: 2022-06-13 | Discharge: 2022-06-13 | Disposition: A | Discharge status: Home / Self Care | Payer: Medicare Other | Source: Ambulatory Visit | Attending: Cardiology | Admitting: Cardiology

## 2022-06-13 DIAGNOSIS — Z951 Presence of aortocoronary bypass graft: Secondary | ICD-10-CM

## 2022-06-13 DIAGNOSIS — Z48812 Encounter for surgical aftercare following surgery on the circulatory system: Secondary | ICD-10-CM | POA: Diagnosis not present

## 2022-06-15 ENCOUNTER — Encounter (HOSPITAL_COMMUNITY)
Admission: RE | Admit: 2022-06-15 | Discharge: 2022-06-15 | Disposition: A | Discharge status: Home / Self Care | Payer: Medicare Other | Source: Ambulatory Visit | Attending: Cardiology | Admitting: Cardiology

## 2022-06-15 DIAGNOSIS — Z951 Presence of aortocoronary bypass graft: Secondary | ICD-10-CM | POA: Diagnosis not present

## 2022-06-15 DIAGNOSIS — Z48812 Encounter for surgical aftercare following surgery on the circulatory system: Secondary | ICD-10-CM | POA: Diagnosis not present

## 2022-06-17 ENCOUNTER — Encounter (HOSPITAL_COMMUNITY)
Admission: RE | Admit: 2022-06-17 | Discharge: 2022-06-17 | Disposition: A | Discharge status: Home / Self Care | Payer: Medicare Other | Source: Ambulatory Visit | Attending: Cardiology | Admitting: Cardiology

## 2022-06-17 DIAGNOSIS — Z951 Presence of aortocoronary bypass graft: Secondary | ICD-10-CM

## 2022-06-17 DIAGNOSIS — Z48812 Encounter for surgical aftercare following surgery on the circulatory system: Secondary | ICD-10-CM | POA: Diagnosis not present

## 2022-06-20 ENCOUNTER — Encounter (HOSPITAL_COMMUNITY)
Admission: RE | Admit: 2022-06-20 | Discharge: 2022-06-20 | Disposition: A | Discharge status: Home / Self Care | Payer: Medicare Other | Source: Ambulatory Visit | Attending: Cardiology | Admitting: Cardiology

## 2022-06-20 DIAGNOSIS — Z951 Presence of aortocoronary bypass graft: Secondary | ICD-10-CM

## 2022-06-20 DIAGNOSIS — Z48812 Encounter for surgical aftercare following surgery on the circulatory system: Secondary | ICD-10-CM | POA: Diagnosis not present

## 2022-06-22 ENCOUNTER — Encounter (HOSPITAL_COMMUNITY)
Admission: RE | Admit: 2022-06-22 | Discharge: 2022-06-22 | Disposition: A | Discharge status: Home / Self Care | Payer: Medicare Other | Source: Ambulatory Visit | Attending: Cardiology | Admitting: Cardiology

## 2022-06-22 DIAGNOSIS — Z951 Presence of aortocoronary bypass graft: Secondary | ICD-10-CM | POA: Diagnosis not present

## 2022-06-22 DIAGNOSIS — Z48812 Encounter for surgical aftercare following surgery on the circulatory system: Secondary | ICD-10-CM | POA: Diagnosis not present

## 2022-06-24 ENCOUNTER — Encounter (HOSPITAL_COMMUNITY)
Admission: RE | Admit: 2022-06-24 | Discharge: 2022-06-24 | Disposition: A | Discharge status: Home / Self Care | Payer: Medicare Other | Source: Ambulatory Visit | Attending: Cardiology | Admitting: Cardiology

## 2022-06-24 DIAGNOSIS — Z951 Presence of aortocoronary bypass graft: Secondary | ICD-10-CM | POA: Diagnosis not present

## 2022-06-24 DIAGNOSIS — Z48812 Encounter for surgical aftercare following surgery on the circulatory system: Secondary | ICD-10-CM | POA: Diagnosis not present

## 2022-06-27 ENCOUNTER — Encounter (HOSPITAL_COMMUNITY)
Admission: RE | Admit: 2022-06-27 | Discharge: 2022-06-27 | Disposition: A | Discharge status: Home / Self Care | Payer: Medicare Other | Source: Ambulatory Visit | Attending: Cardiology | Admitting: Cardiology

## 2022-06-27 DIAGNOSIS — Z48812 Encounter for surgical aftercare following surgery on the circulatory system: Secondary | ICD-10-CM | POA: Diagnosis not present

## 2022-06-27 DIAGNOSIS — Z951 Presence of aortocoronary bypass graft: Secondary | ICD-10-CM | POA: Diagnosis not present

## 2022-06-29 ENCOUNTER — Encounter (HOSPITAL_COMMUNITY)
Admission: RE | Admit: 2022-06-29 | Discharge: 2022-06-29 | Disposition: A | Discharge status: Home / Self Care | Payer: Medicare Other | Source: Ambulatory Visit | Attending: Cardiology | Admitting: Cardiology

## 2022-06-29 DIAGNOSIS — Z48812 Encounter for surgical aftercare following surgery on the circulatory system: Secondary | ICD-10-CM | POA: Diagnosis not present

## 2022-06-29 DIAGNOSIS — Z951 Presence of aortocoronary bypass graft: Secondary | ICD-10-CM

## 2022-06-29 NOTE — Progress Notes (Signed)
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 (min) 34 min    [place] Name 06/13/22 1600     Education   Cardiac Education Topics Pritikin   IT sales professional Nutrition   Nutrition Workshop Label Reading   Instruction Review Code 1-  Information systems manager   Class Start Time 1400   Class Stop Time 1458   Class Time Calculation (min) 58 min    [place] Name 06/16/22 1000     Education   Cardiac Education Topics [place]  completed 06/15/22     [employer]ing School   Educator Dietitian   Weekly Topic Delicious Desserts   Instruction Review Code 1- Information systems manager   Class Start Time 1400   Class Stop Time 1458   Class Time Calculation (min) 58 min    [place] Name 06/17/22 1600     Education   Cardiac Education Topics Pritikin   Lexicographer Nutrition   Nutrition Other  Label Reading   Instruction Review Code 1- Verbalizes Understanding   Class Start Time 1400   Class Stop Time 1448   Class Time Calculation (min) 48 min    [place] Name 06/20/22 1533     Education   Cardiac Education Topics Pritikin   [employer] Workshops     Workshops   Educator Exercise Physiologist   Select Psychosocial   Psychosocial Workshop Other  Focused goals and sustainable changes   Instruction Review Code 1- Verbalizes Understanding   Class Start Time 1402   Class Stop Time 1445   Class Time Calculation (min) 43 min    [place] Name 06/22/22 1600  Education   Cardiac Education Topics [employer]   Educator Dietitian   Weekly Topic Tasty Appetizers and Snacks   Instruction Review Code 1- Verbalizes Understanding   Class Start Time 1402   Class Stop Time 1448   Class Time Calculation (min) 46 min    [place] Name 06/24/22 1500     Education   Cardiac Education Topics Pritikin   Scientist, research (life sciences)   Educator Dietitian   Select Nutrition   Nutrition Calorie Density   Instruction Review Code 1- Verbalizes Understanding   Class Start Time 1400   Class Stop Time 1442   Class Time Calculation (min) 42 min    [place] Name 06/27/22 1500     Education   Cardiac Education Topics [place]   Select  Workshops     Workshops   Educator Exercise Physiologist   Select Exercise   Exercise Workshop Exercise Basics: Building Your Action Plan   Instruction Review Code 1- Verbalizes Understanding   Class Start Time 1359   Class Stop Time 1445   Class Time Calculation (min) 46 min            Core Videos: Exercise    Move It!  Clinical staff conducted group or individual video education with verbal and written material and guidebook.  Patient learns the recommended Pritikin exercise program. Exercise with the goal of living a long, healthy life. Some of the health benefits of exercise include controlled diabetes, healthier blood pressure levels, improved cholesterol levels, improved heart and lung capacity, improved sleep, and better body composition. Everyone should speak with their doctor before starting or changing an exercise routine.  Biomechanical Limitations Clinical staff conducted group or individual video education with verbal and written material and guidebook.  Patient learns how biomechanical limitations can impact exercise and how we can mitigate and possibly overcome limitations to have an impactful and balanced exercise routine.  Body Composition Clinical staff conducted group or individual video education with verbal and written material and guidebook.  Patient learns that body composition (ratio of muscle mass to fat mass) is a key component to assessing overall fitness, rather than body weight alone. Increased fat mass, especially visceral belly fat, can put [place] at increased risk for metabolic syndrome, type 2 diabetes, heart disease, and even death. It is recommended to combine diet and exercise (cardiovascular and resistance training) to improve your body composition. Seek guidance from your physician and exercise physiologist before implementing an exercise routine.  Exercise Action Plan Clinical staff conducted group or individual video education with verbal and  written material and guidebook.  Patient learns the recommended strategies to achieve and enjoy long-term exercise adherence, including variety, self-motivation, self-efficacy, and positive decision making. Benefits of exercise include fitness, good health, weight management, more energy, better sleep, less stress, and overall well-being.  Medical   Heart Disease Risk Reduction Clinical staff conducted group or individual video education with verbal and written material and guidebook.  Patient learns our heart is our most vital organ as it circulates oxygen, nutrients, white blood cells, and hormones throughout the entire body, and carries waste away. Data supports a plant-based eating plan like the Pritikin Program for its effectiveness in slowing progression of and reversing heart disease. The video provides a number of recommendations to address heart disease.   Metabolic Syndrome and Belly Fat  Clinical staff conducted group or individual video education with verbal and  written material and guidebook.  Patient learns what metabolic syndrome is, how it leads to heart disease, and how one can reverse it and keep it from coming back. You have metabolic syndrome if you have 3 of the following 5 criteria: abdominal obesity, high blood pressure, high triglycerides, low HDL cholesterol, and high blood sugar.  Hypertension and Heart Disease Clinical staff conducted group or individual video education with verbal and written material and guidebook.  Patient learns that high blood pressure, or hypertension, is very common in the [place]. Hypertension is largely due to excessive salt intake, but other important risk factors include being overweight, physical inactivity, drinking too much alcohol, smoking, and not eating enough potassium from fruits and vegetables. High blood pressure is a leading risk factor for heart attack, stroke, congestive heart failure, dementia, kidney failure, and premature  death. Long-term effects of excessive salt intake include stiffening of the arteries and thickening of heart muscle and organ damage. Recommendations include ways to reduce hypertension and the risk of heart disease.  Diseases of Our Time - Focusing on Diabetes Clinical staff conducted group or individual video education with verbal and written material and guidebook.  Patient learns why the best way to stop diseases of our time is prevention, through food and other lifestyle changes. Medicine (such as prescription pills and surgeries) is often only a Band-Aid on the problem, not a long-term solution. Most common diseases of our time include obesity, type 2 diabetes, hypertension, heart disease, and cancer. The Pritikin Program is recommended and has been proven to help reduce, reverse, and/or prevent the damaging effects of metabolic syndrome.  Nutrition   Overview of the Pritikin Eating Plan  Clinical staff conducted group or individual video education with verbal and written material and guidebook.  Patient learns about the [place] for disease risk reduction. The [place] emphasizes a wide variety of unrefined, minimally-processed carbohydrates, like fruits, vegetables, whole grains, and legumes. Go, Caution, and Stop food choices are explained. Plant-based and lean animal proteins are emphasized. Rationale provided for low sodium intake for blood pressure control, low added sugars for blood sugar stabilization, and low added fats and oils for coronary artery disease risk reduction and weight management.  Calorie Density  Clinical staff conducted group or individual video education with verbal and written material and guidebook.  Patient learns about calorie density and how it impacts the Pritikin Eating Plan. Knowing the characteristics of the food you choose will help you decide whether those foods will lead to weight gain or weight loss, and whether you want to consume  more or less of them. Weight loss is usually a side effect of the Pritikin Eating Plan because of its focus on low calorie-dense foods.  Label Reading  Clinical staff conducted group or individual video education with verbal and written material and guidebook.  Patient learns about the Pritikin recommended label reading guidelines and corresponding recommendations regarding calorie density, added sugars, sodium content, and whole grains.  Dining Out - Part 1  Clinical staff conducted group or individual video education with verbal and written material and guidebook.  Patient learns that restaurant meals can be sabotaging because they can be so high in calories, fat, sodium, and/or sugar. Patient learns recommended strategies on how to positively address this and avoid unhealthy pitfalls.  Facts on Fats  Clinical staff conducted group or individual video education with verbal and written material and guidebook.  Patient learns that lifestyle modifications can be just as effective,  if not more so, as many medications for lowering your risk of heart disease. A Pritikin lifestyle can help to reduce your risk of inflammation and atherosclerosis (cholesterol build-up, or plaque, in the artery walls). Lifestyle interventions such as dietary choices and physical activity address the cause of atherosclerosis. A review of the types of fats and their impact on blood cholesterol levels, along with dietary recommendations to reduce fat intake is also included.  Nutrition Action Plan  Clinical staff conducted group or individual video education with verbal and written material and guidebook.  Patient learns how to incorporate Pritikin recommendations into their lifestyle. Recommendations include planning and keeping personal health goals in mind as an important part of their success.  Healthy Mind-Set    Healthy Minds, Bodies, Hearts  Clinical staff conducted group or individual video education with verbal and  written material and guidebook.  Patient learns how to identify when they are stressed. Video will discuss the impact of that stress, as well as the many benefits of stress management. Patient will also be introduced to stress management techniques. The way we think, act, and feel has an impact on our hearts.  How Our Thoughts Can Heal Our Hearts  Clinical staff conducted group or individual video education with verbal and written material and guidebook.  Patient learns that negative thoughts can cause depression and anxiety. This can result in negative lifestyle behavior and serious health problems. Cognitive behavioral therapy is an effective method to help control our thoughts in order to change and improve our emotional outlook.  Additional Videos:  Exercise    Improving Performance  Clinical staff conducted group or individual video education with verbal and written material and guidebook.  Patient learns to use a non-linear approach by alternating intensity levels and lengths of time spent exercising to help burn more calories and lose more body fat. Cardiovascular exercise helps improve heart health, metabolism, hormonal balance, blood sugar control, and recovery from fatigue. Resistance training improves strength, endurance, balance, coordination, reaction time, metabolism, and muscle mass. Flexibility exercise improves circulation, posture, and balance. Seek guidance from your physician and exercise physiologist before implementing an exercise routine and learn your capabilities and proper form for all exercise.  Introduction to Yoga  Clinical staff conducted group or individual video education with verbal and written material and guidebook.  Patient learns about yoga, a discipline of the coming together of mind, breath, and body. The benefits of yoga include improved flexibility, improved range of motion, better posture and core strength, increased lung function, weight loss, and positive  self-image. Yoga's heart health benefits include lowered blood pressure, healthier heart rate, decreased cholesterol and triglyceride levels, improved immune function, and reduced stress. Seek guidance from your physician and exercise physiologist before implementing an exercise routine and learn your capabilities and proper form for all exercise.  Medical   Aging: Enhancing Your Quality of Life  Clinical staff conducted group or individual video education with verbal and written material and guidebook.  Patient learns key strategies and recommendations to stay in good physical health and enhance quality of life, such as prevention strategies, having an advocate, securing a [place], and keeping a list of medications and system for tracking them. It also discusses how to avoid risk for bone loss.  Biology of Weight Control  Clinical staff conducted group or individual video education with verbal and written material and guidebook.  Patient learns that weight gain occurs because we consume more calories than we  burn (eating more, moving less). Even if your body weight is normal, you may have higher ratios of fat compared to muscle mass. Too much body fat puts you at increased risk for cardiovascular disease, heart attack, stroke, type 2 diabetes, and obesity-related cancers. In addition to exercise, following the [place] can help reduce your risk.  Decoding Lab Results  Clinical staff conducted group or individual video education with verbal and written material and guidebook.  Patient learns that lab test reflects one measurement whose values change over time and are influenced by many factors, including medication, stress, sleep, exercise, food, hydration, pre-existing medical conditions, and more. It is recommended to use the knowledge from this video to become more involved with your lab results and evaluate your numbers to speak with your  doctor.   Diseases of Our Time - Overview  Clinical staff conducted group or individual video education with verbal and written material and guidebook.  Patient learns that according to the CDC, 50% to 70% of chronic diseases (such as obesity, type 2 diabetes, elevated lipids, hypertension, and heart disease) are avoidable through lifestyle improvements including healthier food choices, listening to satiety cues, and increased physical activity.  Sleep Disorders Clinical staff conducted group or individual video education with verbal and written material and guidebook.  Patient learns how good quality and duration of sleep are important to overall health and well-being. Patient also learns about sleep disorders and how they impact health along with recommendations to address them, including discussing with a physician.  Nutrition  Dining Out - Part 2 Clinical staff conducted group or individual video education with verbal and written material and guidebook.  Patient learns how to plan ahead and communicate in order to maximize their dining experience in a healthy and nutritious manner. Included are recommended food choices based on the type of restaurant the patient is visiting.   Fueling a Best boy conducted group or individual video education with verbal and written material and guidebook.  There is a strong connection between our food choices and our health. Diseases like obesity and type 2 diabetes are very prevalent and are in large-part due to lifestyle choices. The Pritikin Eating Plan provides plenty of food and hunger-curbing satisfaction. It is easy to follow, affordable, and helps reduce health risks.  Menu Workshop  Clinical staff conducted group or individual video education with verbal and written material and guidebook.  Patient learns that restaurant meals can sabotage health goals because they are often packed with calories, fat, sodium, and sugar.  Recommendations include strategies to plan ahead and to communicate with the manager, chef, or server to help order a healthier meal.  Planning Your Eating Strategy  Clinical staff conducted group or individual video education with verbal and written material and guidebook.  Patient learns about the [place] and its benefit of reducing the risk of disease. The [place] does not focus on calories. Instead, it emphasizes high-quality, nutrient-rich foods. By knowing the characteristics of the foods, we choose, we can determine their calorie density and make informed decisions.  Targeting Your Nutrition Priorities  Clinical staff conducted group or individual video education with verbal and written material and guidebook.  Patient learns that lifestyle habits have a tremendous impact on disease risk and progression. This video provides eating and physical activity recommendations based on your personal health goals, such as reducing LDL cholesterol, losing weight, preventing or controlling type 2 diabetes, and reducing high blood pressure.  Vitamins and Minerals  Clinical staff conducted group or individual video education with verbal and written material and guidebook.  Patient learns different ways to obtain key vitamins and minerals, including through a recommended healthy diet. It is important to discuss all supplements you take with your doctor.   Healthy Mind-Set    Smoking Cessation  Clinical staff conducted group or individual video education with verbal and written material and guidebook.  Patient learns that cigarette smoking and tobacco addiction pose a serious health risk which affects millions of people. Stopping smoking will significantly reduce the risk of heart disease, lung disease, and many forms of cancer. Recommended strategies for quitting are covered, including working with your doctor to develop a successful plan.  Culinary   Becoming a Hydrologist conducted group or individual video education with verbal and written material and guidebook.  Patient learns that cooking at home can be healthy, cost-effective, quick, and puts them in control. Keys to cooking healthy recipes will include looking at your recipe, assessing your equipment needs, planning ahead, making it simple, choosing cost-effective seasonal ingredients, and limiting the use of added fats, salts, and sugars.  Cooking - Breakfast and Snacks  Clinical staff conducted group or individual video education with verbal and written material and guidebook.  Patient learns how important breakfast is to satiety and nutrition through the entire day. Recommendations include key foods to eat during breakfast to help stabilize blood sugar levels and to prevent overeating at meals later in the day. Planning ahead is also a key component.  Cooking - Human resources officer conducted group or individual video education with verbal and written material and guidebook.  Patient learns eating strategies to improve overall health, including an approach to cook more at home. Recommendations include thinking of animal protein as a side on your plate rather than center stage and focusing instead on lower calorie dense options like vegetables, fruits, whole grains, and plant-based proteins, such as beans. Making sauces in large quantities to freeze for later and leaving the skin on your vegetables are also recommended to maximize your experience.  Cooking - Healthy Salads and Dressing Clinical staff conducted group or individual video education with verbal and written material and guidebook.  Patient learns that vegetables, fruits, whole grains, and legumes are the foundations of the [place]. Recommendations include how to incorporate each of these in flavorful and healthy salads, and how to create homemade salad dressings. Proper handling of ingredients is also covered.  Cooking - Soups and [employer] - Soups and Desserts Clinical staff conducted group or individual video education with verbal and written material and guidebook.  Patient learns that Pritikin soups and desserts make for easy, nutritious, and delicious snacks and meal components that are low in sodium, fat, sugar, and calorie density, while high in vitamins, minerals, and filling fiber. Recommendations include simple and healthy ideas for soups and desserts.   Overview     The Pritikin Solution Program Overview Clinical staff conducted group or individual video education with verbal and written material and guidebook.  Patient learns that the results of the [place] Program have been documented in more than 100 articles published in peer-reviewed journals, and the benefits include reducing risk factors for (and, in some cases, even reversing) high cholesterol, high blood pressure, type 2 diabetes, obesity, and more! An overview of the three key pillars of the Pritikin Program will be covered: eating well, doing regular exercise, and  having a healthy mind-set.  WORKSHOPS  Exercise: Exercise Basics: Building Your Action Plan Clinical staff led group instruction and group discussion with PowerPoint presentation and patient guidebook. To enhance the learning environment the use of posters, models and videos may be added. At the conclusion of this workshop, patients will comprehend the difference between physical activity and exercise, as well as the benefits of incorporating both, into their routine. Patients will understand the FITT (Frequency, Intensity, Time, and Type) principle and how to use it to build an exercise action plan. In addition, safety concerns and other considerations for exercise and cardiac rehab will be addressed by the presenter. The purpose of this lesson is to promote a comprehensive and effective weekly exercise routine in order to improve patients' overall level of  fitness.   Managing Heart Disease: Your Path to a Healthier Heart Clinical staff led group instruction and group discussion with PowerPoint presentation and patient guidebook. To enhance the learning environment the use of posters, models and videos may be added.At the conclusion of this workshop, patients will understand the anatomy and physiology of the heart. Additionally, they will understand how Pritikin's three pillars impact the risk factors, the progression, and the management of heart disease.  The purpose of this lesson is to provide a high-level overview of the heart, heart disease, and how the Pritikin lifestyle positively impacts risk factors.  Exercise Biomechanics Clinical staff led group instruction and group discussion with PowerPoint presentation and patient guidebook. To enhance the learning environment the use of posters, models and videos may be added. Patients will learn how the structural parts of their bodies function and how these functions impact their daily activities, movement, and exercise. Patients will learn how to promote a neutral spine, learn how to manage pain, and identify ways to improve their physical movement in order to promote healthy living. The purpose of this lesson is to expose patients to common physical limitations that impact physical activity. Participants will learn practical ways to adapt and manage aches and pains, and to minimize their effect on regular exercise. Patients will learn how to maintain good posture while sitting, walking, and lifting.  Balance Training and Fall Prevention  Clinical staff led group instruction and group discussion with PowerPoint presentation and patient guidebook. To enhance the learning environment the use of posters, models and videos may be added. At the conclusion of this workshop, patients will understand the importance of their sensorimotor skills (vision, proprioception, and the vestibular system)  in maintaining their ability to balance as they age. Patients will apply a variety of balancing exercises that are appropriate for their current level of function. Patients will understand the common causes for poor balance, possible solutions to these problems, and ways to modify their physical environment in order to minimize their fall risk. The purpose of this lesson is to teach patients about the importance of maintaining balance as they age and ways to minimize their risk of falling.  WORKSHOPS   Nutrition:  Fueling a Scientist, research (physical sciences) led group instruction and group discussion with PowerPoint presentation and patient guidebook. To enhance the learning environment the use of posters, models and videos may be added. Patients will review the foundational principles of the [place] and understand what constitutes a serving size in each of the food groups. Patients will also learn Pritikin-friendly foods that are better choices when away from home and review make-ahead meal and snack options. Calorie density will be reviewed and applied to three nutrition  priorities: weight maintenance, weight loss, and weight gain. The purpose of this lesson is to reinforce (in a group setting) the key concepts around what patients are recommended to eat and how to apply these guidelines when away from home by planning and selecting Pritikin-friendly options. Patients will understand how calorie density may be adjusted for different weight management goals.  Mindful Eating  Clinical staff led group instruction and group discussion with PowerPoint presentation and patient guidebook. To enhance the learning environment the use of posters, models and videos may be added. Patients will briefly review the concepts of the [place] and the importance of low-calorie dense foods. The concept of mindful eating will be introduced as well as the importance of paying attention to internal hunger  signals. Triggers for non-hunger eating and techniques for dealing with triggers will be explored. The purpose of this lesson is to provide patients with the opportunity to review the basic principles of the [place], discuss the value of eating mindfully and how to measure internal cues of hunger and fullness using the Hunger Scale. Patients will also discuss reasons for non-hunger eating and learn strategies to use for controlling emotional eating.  Targeting Your Nutrition Priorities Clinical staff led group instruction and group discussion with PowerPoint presentation and patient guidebook. To enhance the learning environment the use of posters, models and videos may be added. Patients will learn how to determine their genetic susceptibility to disease by reviewing their family history. Patients will gain insight into the importance of diet as part of an overall healthy lifestyle in mitigating the impact of genetics and other environmental insults. The purpose of this lesson is to provide patients with the opportunity to assess their personal nutrition priorities by looking at their family history, their own health history and current risk factors. Patients will also be able to discuss ways of prioritizing and modifying the [place] for their highest risk areas  Menu  Clinical staff led group instruction and group discussion with PowerPoint presentation and patient guidebook. To enhance the learning environment the use of posters, models and videos may be added. Using menus brought in from [employer], or printed from [employer], patients will apply the [place] dining out guidelines that were presented in the [employer] video. Patients will also be able to practice these guidelines in a variety of provided scenarios. The purpose of this lesson is to provide patients with the opportunity to practice hands-on learning of the [place]  with actual menus and practice scenarios.  Label Reading Clinical staff led group instruction and group discussion with PowerPoint presentation and patient guidebook. To enhance the learning environment the use of posters, models and videos may be added. Patients will review and discuss the Pritikin label reading guidelines presented in Pritikin's Label Reading Educational series video. Using fool labels brought in from local grocery stores and markets, patients will apply the label reading guidelines and determine if the packaged food meet the Pritikin guidelines. The purpose of this lesson is to provide patients with the opportunity to review, discuss, and practice hands-on learning of the Pritikin Label Reading guidelines with actual packaged food labels. [place] Workshops are designed to teach patients ways to prepare quick, simple, and affordable recipes at home. The importance of nutrition's role in chronic disease risk reduction is reflected in its emphasis in the overall Pritikin program. By learning how to prepare essential core Pritikin Eating Plan recipes,  patients will increase control over what they eat; be able to customize the flavor of foods without the use of added salt, sugar, or fat; and improve the quality of the food they consume. By learning a set of core recipes which are easily assembled, quickly prepared, and affordable, patients are more likely to prepare more healthy foods at home. These workshops focus on convenient breakfasts, simple entres, side dishes, and desserts which can be prepared with minimal effort and are consistent with nutrition recommendations for cardiovascular risk reduction. Cooking [employer] are taught by a Engineer, materials (RD) who has been trained by the [employer]. The chef or RD has a clear understanding of the importance of minimizing - if not completely eliminating - added fat, sugar, and  sodium in recipes. Throughout the series of [place] Workshop sessions, patients will learn about healthy ingredients and efficient methods of cooking to build confidence in their capability to prepare    Cooking School weekly topics:  Adding Flavor- Sodium-Free  Fast and Healthy Breakfasts  Powerhouse Plant-Based Proteins  Satisfying Salads and Dressings  Simple Sides and Sauces  International Cuisine-Spotlight on the [place] Zones  Delicious Desserts  Savory Soups  Efficiency Cooking - Meals in a Snap  Tasty Appetizers and Snacks  Comforting Weekend Breakfasts  One-Pot Wonders   Fast Evening Meals  Easy [place] (Psychosocial): New Thoughts, New Behaviors Clinical staff led group instruction and group discussion with PowerPoint presentation and patient guidebook. To enhance the learning environment the use of posters, models and videos may be added. Patients will learn and practice techniques for developing effective health and lifestyle goals. Patients will be able to effectively apply the goal setting process learned to develop at least one new personal goal.  The purpose of this lesson is to expose patients to a new skill set of behavior modification techniques such as techniques setting SMART goals, overcoming barriers, and achieving new thoughts and new behaviors.  Managing Moods and Relationships Clinical staff led group instruction and group discussion with PowerPoint presentation and patient guidebook. To enhance the learning environment the use of posters, models and videos may be added. Patients will learn how emotional and chronic stress factors can impact their health and relationships. They will learn healthy ways to manage their moods and utilize positive coping mechanisms. In addition, ICR patients will learn ways to improve communication skills. The purpose of this lesson is to expose patients to ways  of understanding how one's mood and health are intimately connected. Developing a healthy outlook can help build positive relationships and connections with others. Patients will understand the importance of utilizing effective communication skills that include actively listening and being heard. They will learn and understand the importance of the "4 Cs" and especially Connections in fostering of a Healthy Mind-Set.  Healthy Sleep for a Healthy Heart Clinical staff led group instruction and group discussion with PowerPoint presentation and patient guidebook. To enhance the learning environment the use of posters, models and videos may be added. At the conclusion of this workshop, patients will be able to demonstrate knowledge of the importance of sleep to overall health, well-being, and quality of life. They will understand the symptoms of, and treatments for, common sleep disorders. Patients will also be able to identify daytime and nighttime behaviors which impact sleep, and they will be able to apply these tools to help manage sleep-related challenges. The purpose of this  lesson is to provide patients with a general overview of sleep and outline the importance of quality sleep. Patients will learn about a few of the most common sleep disorders. Patients will also be introduced to the concept of "sleep hygiene," and discover ways to self-manage certain sleeping problems through simple daily behavior changes. Finally, the workshop will motivate patients by clarifying the links between quality sleep and their goals of heart-healthy living.   Recognizing and Reducing Stress Clinical staff led group instruction and group discussion with PowerPoint presentation and patient guidebook. To enhance the learning environment the use of posters, models and videos may be added. At the conclusion of this workshop, patients will be able to understand the types of stress reactions, differentiate between acute and chronic  stress, and recognize the impact that chronic stress has on their health. They will also be able to apply different coping mechanisms, such as reframing negative self-talk. Patients will have the opportunity to practice a variety of stress management techniques, such as deep abdominal breathing, progressive muscle relaxation, and/or guided imagery.  The purpose of this lesson is to educate patients on the role of stress in their lives and to provide healthy techniques for coping with it.  Learning Barriers/Preferences:  Learning Barriers/Preferences - 05/17/22 1200       Learning Barriers/Preferences   Learning Barriers Sight;Hearing   wears reading glasses. Has bilateral hearing aides   Learning Preferences Audio;Individual Instruction;Group Instruction;Pictoral;Skilled Demonstration;Verbal Instruction;Video;Written Material             Education Topics:  Knowledge Questionnaire Score:  Knowledge Questionnaire Score - 05/17/22 1144       Knowledge Questionnaire Score   Pre Score 15/24             Core Components/Risk Factors/Patient Goals at Admission:  Personal Goals and Risk Factors at Admission - 05/17/22 1145       Core Components/Risk Factors/Patient Goals on Admission    Weight Management Yes;Weight Maintenance    Intervention Weight Management: Develop a combined nutrition and exercise program designed to reach desired caloric intake, while maintaining appropriate intake of nutrient and fiber, sodium and fats, and appropriate energy expenditure required for the weight goal.;Weight Management: Provide education and appropriate resources to help participant work on and attain dietary goals.    Expected Outcomes Short Term: Continue to assess and modify interventions until short term weight is achieved;Long Term: Adherence to nutrition and physical activity/exercise program aimed toward attainment of established weight goal;Weight Maintenance: Understanding of the daily  nutrition guidelines, which includes 25-35% calories from fat, 7% or less cal from saturated fats, less than '200mg'$  cholesterol, less than 1.5gm of sodium, & 5 or more servings of fruits and vegetables daily;Understanding recommendations for meals to include 15-35% energy as protein, 25-35% energy from fat, 35-60% energy from carbohydrates, less than '200mg'$  of dietary cholesterol, 20-35 gm of total fiber daily;Understanding of distribution of calorie intake throughout the day with the consumption of 4-5 meals/snacks    Improve shortness of breath with ADL's Yes    Intervention Provide education, individualized exercise plan and daily activity instruction to help decrease symptoms of SOB with activities of daily living.    Expected Outcomes Short Term: Improve cardiorespiratory fitness to achieve a reduction of symptoms when performing ADLs;Long Term: Be able to perform more ADLs without symptoms or delay the onset of symptoms    Diabetes Yes    Intervention Provide education about signs/symptoms and action to take for hypo/hyperglycemia.;Provide education about proper nutrition, including  hydration, and aerobic/resistive exercise prescription along with prescribed medications to achieve blood glucose in normal ranges: Fasting glucose 65-99 mg/dL    Expected Outcomes Short Term: Participant verbalizes understanding of the signs/symptoms and immediate care of hyper/hypoglycemia, proper foot care and importance of medication, aerobic/resistive exercise and nutrition plan for blood glucose control.;Long Term: Attainment of HbA1C < 7%.    Hypertension Yes    Intervention Provide education on lifestyle modifcations including regular physical activity/exercise, weight management, moderate sodium restriction and increased consumption of fresh fruit, vegetables, and low fat dairy, alcohol moderation, and smoking cessation.;Monitor prescription use compliance.    Expected Outcomes Short Term: Continued assessment and  intervention until BP is < 140/52m HG in hypertensive participants. < 130/880mHG in hypertensive participants with diabetes, heart failure or chronic kidney disease.;Long Term: Maintenance of blood pressure at goal levels.    Lipids Yes    Intervention Provide education and support for participant on nutrition & aerobic/resistive exercise along with prescribed medications to achieve LDL '70mg'$ , HDL >'40mg'$ .    Expected Outcomes Short Term: Participant states understanding of desired cholesterol values and is compliant with medications prescribed. Participant is following exercise prescription and nutrition guidelines.;Long Term: Cholesterol controlled with medications as prescribed, with individualized exercise RX and with personalized nutrition plan. Value goals: LDL < '70mg'$ , HDL > 40 mg.    Personal Goal Other Yes    Personal Goal Loosen joints.    Intervention Provide individualized exercise prescription including aerobic, resistance, and stretching exercises to help improve flexibility/ loosen joints.    Expected Outcomes Improved flexibility/ decreased stiffness in joints as measured by sit-and-reach test and self report.             Core Components/Risk Factors/Patient Goals Review:   Goals and Risk Factor Review     Row Name 05/25/22 1653 05/31/22 1549 06/29/22 1546         Core Components/Risk Factors/Patient Goals Review   Personal Goals Review Weight Management/Obesity;Improve shortness of breath with ADL's;Diabetes;Lipids Weight Management/Obesity;Improve shortness of breath with ADL's;Diabetes;Lipids Weight Management/Obesity;Improve shortness of breath with ADL's;Diabetes;Lipids     Review OrJaceiontarted intensive cardiac rehab on 05/25/22. OrQuinid well with exercise, vital signs and CBG's were stable OrThomtarted intensive cardiac rehab on 05/25/22. OrKalmans off to a good start to  exercise, vital signs and CBG's have been stable. Oneill's heart rate has been improved  since his metoprolol has been decreased OrJayvons doing well with  exercise at intensive cardiac rehab., vital signs and CBG's have been stable.     Expected Outcomes OrEmigdioill continue to particiapte in intensive cardiac rehab for exercise, nutrition and lifestyle modifications OrTaiyoill continue to particiapte in intensive cardiac rehab for exercise, nutrition and lifestyle modifications OrEthridgeill continue to particiapte in intensive cardiac rehab for exercise, nutrition and lifestyle modifications              Core Components/Risk Factors/Patient Goals at Discharge (Final Review):   Goals and Risk Factor Review - 06/29/22 1546       Core Components/Risk Factors/Patient Goals Review   Personal Goals Review Weight Management/Obesity;Improve shortness of breath with ADL's;Diabetes;Lipids    Review OrJarens doing well with  exercise at intensive cardiac rehab., vital signs and CBG's have been stable.    Expected Outcomes OrLazarusill continue to particiapte in intensive cardiac rehab for exercise, nutrition and lifestyle modifications             ITP Comments:  ITP Comments  [place] Name 05/17/22 9509 05/25/22 1651 05/31/22 1548 06/29/22 1544     ITP Comments Medical Director- Dr. Fransico Him, MD. Introduction to [facility]in Education Program/ Intensive Cardiac Rehab. Inital Orientation Packet Reviewed with the patient 30 Day ITP Review. Sadao started intensive cardiac rehab on 05/25/22. Carlosdaniel did well with exercise for his fitness level 30 Day ITP Review. Sherard started intensive cardiac rehab on 05/25/22 and is off to a good start to exercise 30 Day ITP Review. Srihaan has good attendance and participation in  intensive cardiac rehab.             Comments: See ITP comments.Harrell Gave RN BSN

## 2022-07-01 ENCOUNTER — Encounter (HOSPITAL_COMMUNITY)
Admission: RE | Admit: 2022-07-01 | Discharge: 2022-07-01 | Disposition: A | Discharge status: Home / Self Care | Payer: Medicare Other | Source: Ambulatory Visit | Attending: Cardiology | Admitting: Cardiology

## 2022-07-01 DIAGNOSIS — Z951 Presence of aortocoronary bypass graft: Secondary | ICD-10-CM | POA: Diagnosis not present

## 2022-07-04 ENCOUNTER — Encounter (HOSPITAL_COMMUNITY)
Admission: RE | Admit: 2022-07-04 | Discharge: 2022-07-04 | Disposition: A | Discharge status: Home / Self Care | Payer: Medicare Other | Source: Ambulatory Visit | Attending: Cardiology | Admitting: Cardiology

## 2022-07-04 DIAGNOSIS — Z951 Presence of aortocoronary bypass graft: Secondary | ICD-10-CM

## 2022-07-06 ENCOUNTER — Encounter (HOSPITAL_COMMUNITY)
Admission: RE | Admit: 2022-07-06 | Discharge: 2022-07-06 | Disposition: A | Discharge status: Home / Self Care | Payer: Medicare Other | Source: Ambulatory Visit | Attending: Cardiology | Admitting: Cardiology

## 2022-07-06 DIAGNOSIS — Z951 Presence of aortocoronary bypass graft: Secondary | ICD-10-CM | POA: Diagnosis not present

## 2022-07-07 DIAGNOSIS — E119 Type 2 diabetes mellitus without complications: Secondary | ICD-10-CM | POA: Diagnosis not present

## 2022-07-08 ENCOUNTER — Encounter (HOSPITAL_COMMUNITY)
Admission: RE | Admit: 2022-07-08 | Discharge: 2022-07-08 | Disposition: A | Discharge status: Home / Self Care | Payer: Medicare Other | Source: Ambulatory Visit | Attending: Cardiology | Admitting: Cardiology

## 2022-07-08 DIAGNOSIS — Z951 Presence of aortocoronary bypass graft: Secondary | ICD-10-CM | POA: Diagnosis not present

## 2022-07-11 ENCOUNTER — Encounter (HOSPITAL_COMMUNITY)
Admission: RE | Admit: 2022-07-11 | Discharge: 2022-07-11 | Disposition: A | Discharge status: Home / Self Care | Payer: Medicare Other | Source: Ambulatory Visit | Attending: Cardiology | Admitting: Cardiology

## 2022-07-11 VITALS — Ht 72.25 in | Wt 188.5 lb

## 2022-07-11 DIAGNOSIS — Z951 Presence of aortocoronary bypass graft: Secondary | ICD-10-CM | POA: Diagnosis not present

## 2022-07-13 ENCOUNTER — Encounter (HOSPITAL_COMMUNITY)
Admission: RE | Admit: 2022-07-13 | Discharge: 2022-07-13 | Disposition: A | Discharge status: Home / Self Care | Payer: Medicare Other | Source: Ambulatory Visit | Attending: Cardiology | Admitting: Cardiology

## 2022-07-13 DIAGNOSIS — Z951 Presence of aortocoronary bypass graft: Secondary | ICD-10-CM | POA: Diagnosis not present

## 2022-07-14 DIAGNOSIS — J449 Chronic obstructive pulmonary disease, unspecified: Secondary | ICD-10-CM | POA: Diagnosis not present

## 2022-07-14 DIAGNOSIS — Z79899 Other long term (current) drug therapy: Secondary | ICD-10-CM | POA: Diagnosis not present

## 2022-07-14 DIAGNOSIS — E1151 Type 2 diabetes mellitus with diabetic peripheral angiopathy without gangrene: Secondary | ICD-10-CM | POA: Diagnosis not present

## 2022-07-14 DIAGNOSIS — E1165 Type 2 diabetes mellitus with hyperglycemia: Secondary | ICD-10-CM | POA: Diagnosis not present

## 2022-07-14 DIAGNOSIS — C679 Malignant neoplasm of bladder, unspecified: Secondary | ICD-10-CM | POA: Diagnosis not present

## 2022-07-14 DIAGNOSIS — Z0001 Encounter for general adult medical examination with abnormal findings: Secondary | ICD-10-CM | POA: Diagnosis not present

## 2022-07-14 DIAGNOSIS — E7800 Pure hypercholesterolemia, unspecified: Secondary | ICD-10-CM | POA: Diagnosis not present

## 2022-07-14 DIAGNOSIS — N1831 Chronic kidney disease, stage 3a: Secondary | ICD-10-CM | POA: Diagnosis not present

## 2022-07-14 DIAGNOSIS — Z9181 History of falling: Secondary | ICD-10-CM | POA: Diagnosis not present

## 2022-07-14 DIAGNOSIS — E1122 Type 2 diabetes mellitus with diabetic chronic kidney disease: Secondary | ICD-10-CM | POA: Diagnosis not present

## 2022-07-14 DIAGNOSIS — Z1211 Encounter for screening for malignant neoplasm of colon: Secondary | ICD-10-CM | POA: Diagnosis not present

## 2022-07-15 ENCOUNTER — Encounter (HOSPITAL_COMMUNITY)
Admission: RE | Admit: 2022-07-15 | Discharge: 2022-07-15 | Disposition: A | Discharge status: Home / Self Care | Payer: Medicare Other | Source: Ambulatory Visit | Attending: Cardiology | Admitting: Cardiology

## 2022-07-15 DIAGNOSIS — Z951 Presence of aortocoronary bypass graft: Secondary | ICD-10-CM

## 2022-07-15 NOTE — Progress Notes (Signed)
Discharge Progress Report  Patient Details  Name: [name] MRN: [id number] Date of Birth: 06/18/1945 Referring Provider:   Flowsheet Row INTENSIVE CARDIAC REHAB ORIENT from 05/17/2022 in [facility]  Referring Provider Jerline Pain, MD        Number of Visits: 45  Reason for Discharge:  Patient reached a stable level of exercise. Patient independent in their exercise. Patient has met program and personal goals.  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  ADL UCSD:   Initial Exercise Prescription:  Initial Exercise Prescription - 05/17/22 1100       Date of Initial Exercise RX and Referring Provider   Date 05/17/22    Referring Provider Jerline Pain, MD    Expected Discharge Date 07/15/22      NuStep   Level 2    SPM 85    Minutes 15    METs 2.2      Recumbant Elliptical   Level 1    RPM 40    Watts 35    Minutes 15    METs 2.1      Prescription Details   Frequency (times per week) 3    Duration Progress to 30 minutes of continuous aerobic without signs/symptoms of physical distress      Intensity   THRR 40-80% of Max Heartrate 57-114    Ratings of Perceived Exertion 11-13    Perceived Dyspnea 0-4      Progression   Progression Continue to progress workloads to maintain intensity without signs/symptoms of physical distress.      Resistance Training   Training Prescription Yes    Weight 3 lbs    Reps 10-15             Discharge Exercise Prescription (Final Exercise Prescription Changes):  Exercise Prescription Changes - 07/01/22 1636       Response to Exercise   Blood Pressure (Admit) 108/62    Blood Pressure (Exercise) 144/78    Blood Pressure (Exit)  104/68    Heart Rate (Admit) 69 bpm    Heart Rate (Exercise) 96 bpm    Heart Rate (Exit) 63 bpm    Rating of Perceived Exertion (Exercise) 11.5    Perceived Dyspnea (Exercise) 0    Symptoms None    Comments Reviewed MET's    Duration Progress to 30 minutes of  aerobic without signs/symptoms of physical distress    Intensity THRR unchanged      Progression   Progression Continue to progress workloads to maintain intensity without signs/symptoms of physical distress.    Average METs 2.9      Resistance Training   Training Prescription Yes    Weight 4 lbs wts    Reps 10-15    Time 10 Minutes      NuStep   Level 3    SPM 76    Minutes 15    METs 2.5      Recumbant Elliptical   Level 3    RPM 50    Watts 66    Minutes 15    METs 3.3      Home Exercise Plan   Plans to  continue exercise at [employer] (comment)    Frequency Add 3 additional days to program exercise sessions.    Initial Home Exercises Provided 06/10/22             Functional Capacity:  6 Minute Walk     Row Name 05/17/22 1055 07/11/22 1633       6 Minute Walk   Phase Initial Discharge    Distance 1062 feet 1510 feet    Distance % Change -- 42.18 %    Distance Feet Change -- 498 ft    Walk Time 6 minutes 6 minutes    # of Rest Breaks 0 0    MPH 2.01 2.86    METS 2.1 2.91    RPE 11 11    Perceived Dyspnea  0 0    VO2 Peak 7.36 10.2    Symptoms No No    Resting HR 46 bpm 63 bpm    Resting BP 106/70 120/60    Resting Oxygen Saturation  100 % --    Exercise Oxygen Saturation  during 6 min walk 100 % --    Max Ex. HR 70 bpm 86 bpm    Max Ex. BP 138/70 130/60    2 Minute Post BP 122/78 126/58             Psychological, QOL, Others - Outcomes: PHQ 2/9:    07/15/2022    3:34 PM 05/17/2022   12:02 PM  Depression screen PHQ 2/9  Decreased Interest 0 0  Down, Depressed, Hopeless 0 0  PHQ - 2 Score 0 0    Quality of Life:  Quality of Life - 07/13/22 1644       Quality of  Life   Select Quality of Life      Quality of Life Scores   Health/Function Post 27.13 %    Socioeconomic Post 26.08 %    Psych/Spiritual Post 30 %    Family Post 29.5 %    GLOBAL Post 27.91 %             Personal Goals: Goals established at orientation with interventions provided to work toward goal.  Personal Goals and Risk Factors at Admission - 05/17/22 1145       Core Components/Risk Factors/Patient Goals on Admission    Weight Management Yes;Weight Maintenance    Intervention Weight Management: Develop a combined nutrition and exercise program designed to reach desired caloric intake, while maintaining appropriate intake of nutrient and fiber, sodium and fats, and appropriate energy expenditure required for the weight goal.;Weight Management: Provide education and appropriate resources to help participant work on and attain dietary goals.    Expected Outcomes Short Term: Continue to assess and modify interventions until short term weight is achieved;Long Term: Adherence to nutrition and physical activity/exercise program aimed toward attainment of established weight goal;Weight Maintenance: Understanding of the daily nutrition guidelines, which includes 25-35% calories from fat, 7% or less cal from saturated fats, less than '200mg'$  cholesterol, less than 1.5gm of sodium, & 5 or more servings of fruits and vegetables daily;Understanding recommendations for meals to include 15-35% energy as protein, 25-35% energy from fat, 35-60% energy from carbohydrates, less than '200mg'$  of dietary cholesterol, 20-35 gm of total fiber daily;Understanding of distribution of calorie intake throughout the day with the consumption of 4-5 meals/snacks    Improve shortness of breath with ADL's Yes    Intervention Provide education, individualized exercise plan and daily activity instruction to help decrease symptoms of SOB  with activities of daily living.    Expected Outcomes Short Term: Improve  cardiorespiratory fitness to achieve a reduction of symptoms when performing ADLs;Long Term: Be able to perform more ADLs without symptoms or delay the onset of symptoms    Diabetes Yes    Intervention Provide education about signs/symptoms and action to take for hypo/hyperglycemia.;Provide education about proper nutrition, including hydration, and aerobic/resistive exercise prescription along with prescribed medications to achieve blood glucose in normal ranges: Fasting glucose 65-99 mg/dL    Expected Outcomes Short Term: Participant verbalizes understanding of the signs/symptoms and immediate care of hyper/hypoglycemia, proper foot care and importance of medication, aerobic/resistive exercise and nutrition plan for blood glucose control.;Long Term: Attainment of HbA1C < 7%.    Hypertension Yes    Intervention Provide education on lifestyle modifcations including regular physical activity/exercise, weight management, moderate sodium restriction and increased consumption of fresh fruit, vegetables, and low fat dairy, alcohol moderation, and smoking cessation.;Monitor prescription use compliance.    Expected Outcomes Short Term: Continued assessment and intervention until BP is < 140/73m HG in hypertensive participants. < 130/858mHG in hypertensive participants with diabetes, heart failure or chronic kidney disease.;Long Term: Maintenance of blood pressure at goal levels.    Lipids Yes    Intervention Provide education and support for participant on nutrition & aerobic/resistive exercise along with prescribed medications to achieve LDL '70mg'$ , HDL >'40mg'$ .    Expected Outcomes Short Term: Participant states understanding of desired cholesterol values and is compliant with medications prescribed. Participant is following exercise prescription and nutrition guidelines.;Long Term: Cholesterol controlled with medications as prescribed, with individualized exercise RX and with personalized nutrition plan. Value  goals: LDL < '70mg'$ , HDL > 40 mg.    Personal Goal Other Yes    Personal Goal Loosen joints.    Intervention Provide individualized exercise prescription including aerobic, resistance, and stretching exercises to help improve flexibility/ loosen joints.    Expected Outcomes Improved flexibility/ decreased stiffness in joints as measured by sit-and-reach test and self report.              Personal Goals Discharge:  Goals and Risk Factor Review     Row Name 05/25/22 1653 05/31/22 1549 06/29/22 1546         Core Components/Risk Factors/Patient Goals Review   Personal Goals Review Weight Management/Obesity;Improve shortness of breath with ADL's;Diabetes;Lipids Weight Management/Obesity;Improve shortness of breath with ADL's;Diabetes;Lipids Weight Management/Obesity;Improve shortness of breath with ADL's;Diabetes;Lipids     Review OrGokultarted intensive cardiac rehab on 05/25/22. OrEthanjacobid well with exercise, vital signs and CBG's were stable OrGuhantarted intensive cardiac rehab on 05/25/22. OrPennys off to a good start to  exercise, vital signs and CBG's have been stable. Develle's heart rate has been improved since his metoprolol has been decreased OrJalonnies doing well with  exercise at intensive cardiac rehab., vital signs and CBG's have been stable.     Expected Outcomes OrMaverickill continue to particiapte in intensive cardiac rehab for exercise, nutrition and lifestyle modifications OrSoniaill continue to particiapte in intensive cardiac rehab for exercise, nutrition and lifestyle modifications OrHaryill continue to particiapte in intensive cardiac rehab for exercise, nutrition and lifestyle modifications              Exercise Goals and Review:  Exercise Goals     Row Name 05/17/22 1005             Exercise Goals   Increase Physical Activity Yes  Intervention Provide advice, education, support and counseling about physical activity/exercise needs.;Develop  an individualized exercise prescription for aerobic and resistive training based on initial evaluation findings, risk stratification, comorbidities and participant's personal goals.       Expected Outcomes Short Term: Attend rehab on a regular basis to increase amount of physical activity.;Long Term: Exercising regularly at least 3-5 days a week.;Long Term: Add in home exercise to make exercise part of routine and to increase amount of physical activity.       Increase Strength and Stamina Yes       Intervention Provide advice, education, support and counseling about physical activity/exercise needs.;Develop an individualized exercise prescription for aerobic and resistive training based on initial evaluation findings, risk stratification, comorbidities and participant's personal goals.       Expected Outcomes Short Term: Increase workloads from initial exercise prescription for resistance, speed, and METs.;Short Term: Perform resistance training exercises routinely during rehab and add in resistance training at home;Long Term: Improve cardiorespiratory fitness, muscular endurance and strength as measured by increased METs and functional capacity (6MWT)       Able to understand and use rate of perceived exertion (RPE) scale Yes       Intervention Provide education and explanation on how to use RPE scale       Expected Outcomes Short Term: Able to use RPE daily in rehab to express subjective intensity level;Long Term:  Able to use RPE to guide intensity level when exercising independently       Knowledge and understanding of Target Heart Rate Range (THRR) Yes       Intervention Provide education and explanation of THRR including how the numbers were predicted and where they are located for reference       Expected Outcomes Short Term: Able to state/look up THRR;Long Term: Able to use THRR to govern intensity when exercising independently;Short Term: Able to use daily as guideline for intensity in rehab        Able to check pulse independently Yes       Intervention Provide education and demonstration on how to check pulse in carotid and radial arteries.;Review the importance of being able to check your own pulse for safety during independent exercise       Expected Outcomes Short Term: Able to explain why pulse checking is important during independent exercise;Long Term: Able to check pulse independently and accurately       Understanding of Exercise Prescription Yes       Intervention Provide education, explanation, and written materials on patient's individual exercise prescription       Expected Outcomes Short Term: Able to explain program exercise prescription;Long Term: Able to explain home exercise prescription to exercise independently                Exercise Goals Re-Evaluation:  Exercise Goals Re-Evaluation     Row Name 05/25/22 1624 06/10/22 1643           Exercise Goal Re-Evaluation   Exercise Goals Review Increase Physical Activity;Understanding of Exercise Prescription;Increase Strength and Stamina;Knowledge and understanding of Target Heart Rate Range (THRR);Able to understand and use rate of perceived exertion (RPE) scale Increase Physical Activity;Understanding of Exercise Prescription;Increase Strength and Stamina;Knowledge and understanding of Target Heart Rate Range (THRR);Able to understand and use rate of perceived exertion (RPE) scale      Comments Pt first day in the [facility] program. Pt tolerated exercise well with an average MET level of 1.55. Pt is learning his THRR, RPE  and ExRx Reviewed MET's, goals and Home ExRx. Pt tolerated exercise well with an average MET level of 2.8. Pt feels good about the progress he has made so far and feels good about his goals of becoming more flexible and improving his breathing. Pt will continue to exercise by walking, doing handweights at home and going to community gym 2-4 days for 30-45 mins per session.      Expected Outcomes Will  continue to monitor pt and progress workloads as tolerated without sign or symptom Pt will continue to exercise on his own. Will continue to monitor pt and progress workloads as tolerated without sign or symptom.               Nutrition & Weight - Outcomes:  Pre Biometrics - 05/17/22 0909       Pre Biometrics   Waist Circumference 38.75 inches    Hip Circumference 39 inches    Waist to Hip Ratio 0.99 %    Triceps Skinfold 5 mm    % Body Fat 21.5 %    Grip Strength 29 kg    Flexibility 0 in    Single Leg Stand 30 seconds             Post Biometrics - 07/11/22 1638        Post  Biometrics   Height 6' 0.25" (1.835 m)    Weight 85.5 kg    Waist Circumference 39 inches    Hip Circumference 39 inches    Waist to Hip Ratio 1 %    BMI (Calculated) 25.39    Triceps Skinfold 5 mm    % Body Fat 21.8 %    Grip Strength 34 kg    Flexibility 0 in   did not attempt   Single Leg Stand 30 seconds             Nutrition:  Nutrition Therapy & Goals - 07/01/22 1600       Nutrition Therapy   Diet Heart Healthy Diet    Drug/Food Interactions Statins/Certain Fruits      Personal Nutrition Goals   Nutrition Goal Patient to identify strategies for reducing cardiovascular risk by attending the [employer] education and nutrition series weekly.    Personal Goal #2 Patient to improve diet quality by using the plate method as a guide for meal planning to include lean protein/plant protein, fruits, vegetables, whole grains, and nonfat/low fat dairy as part of a balanced diet.    Comments Goals in progress. Shrihaan continues to attend the [employer] education series regularly. He reports improved understanding of reading food labels for sodium and sugar. He continues to choose many high fiber foods and monitor saturated fat intake. His wife is a good support. Desmone will continue to benefit from participation in intensive cardiac rehab for nutrition, exercise, and lifestyle modification.       Intervention Plan   Intervention Prescribe, educate and counsel regarding individualized specific dietary modifications aiming towards targeted core components such as weight, hypertension, lipid management, diabetes, heart failure and other comorbidities.;Nutrition handout(s) given to patient.    Expected Outcomes Short Term Goal: Understand basic principles of dietary content, such as calories, fat, sodium, cholesterol and nutrients.;Long Term Goal: Adherence to prescribed nutrition plan.             Nutrition Discharge:  Nutrition Assessments - 07/15/22 1409       Rate Your Plate Scores   Pre Score --    Post Score 54  Education Questionnaire Score:  Knowledge Questionnaire Score - 07/13/22 1645       Knowledge Questionnaire Score   Post Score 24/24             Goals reviewed with patient; copy given to patient. Pt graduates from  Intensive/Traditional cardiac rehab program on 07/14/22  with completion of  45  exercise and education sessions. Pt maintained good attendance and progressed nicely during their participation in rehab as evidenced by increased MET level.   Medication list reconciled. Repeat  PHQ score- 0 .  Pt has made significant lifestyle changes and should be commended for their success. Jarid  achieved their goals during cardiac rehab.   Pt plans to continue exercise walking 3-4 days a week in his neighborhood and going to [employer]. Juanantonio increased his distance on his post exercise walk test by 498 feet. Derold says the program has been helpful for him he has noticed improvement in his overall health. We are proud of Kaelum's progress!Harrell Gave RN BSN

## 2022-07-18 DIAGNOSIS — Z1211 Encounter for screening for malignant neoplasm of colon: Secondary | ICD-10-CM | POA: Diagnosis not present

## 2022-07-26 ENCOUNTER — Ambulatory Visit: Payer: Medicare Other | Admitting: Cardiology

## 2022-07-28 DIAGNOSIS — L821 Other seborrheic keratosis: Secondary | ICD-10-CM | POA: Diagnosis not present

## 2022-07-28 DIAGNOSIS — D692 Other nonthrombocytopenic purpura: Secondary | ICD-10-CM | POA: Diagnosis not present

## 2022-07-28 DIAGNOSIS — L570 Actinic keratosis: Secondary | ICD-10-CM | POA: Diagnosis not present

## 2022-07-28 DIAGNOSIS — L918 Other hypertrophic disorders of the skin: Secondary | ICD-10-CM | POA: Diagnosis not present

## 2022-07-28 DIAGNOSIS — Z85828 Personal history of other malignant neoplasm of skin: Secondary | ICD-10-CM | POA: Diagnosis not present

## 2022-07-28 DIAGNOSIS — D1801 Hemangioma of skin and subcutaneous tissue: Secondary | ICD-10-CM | POA: Diagnosis not present

## 2022-07-28 DIAGNOSIS — L218 Other seborrheic dermatitis: Secondary | ICD-10-CM | POA: Diagnosis not present

## 2022-08-09 DIAGNOSIS — E1122 Type 2 diabetes mellitus with diabetic chronic kidney disease: Secondary | ICD-10-CM | POA: Diagnosis not present

## 2022-08-09 DIAGNOSIS — E1169 Type 2 diabetes mellitus with other specified complication: Secondary | ICD-10-CM | POA: Diagnosis not present

## 2022-08-09 DIAGNOSIS — I10 Essential (primary) hypertension: Secondary | ICD-10-CM | POA: Diagnosis not present

## 2022-08-09 DIAGNOSIS — I2510 Atherosclerotic heart disease of native coronary artery without angina pectoris: Secondary | ICD-10-CM | POA: Diagnosis not present

## 2022-08-09 DIAGNOSIS — N1831 Chronic kidney disease, stage 3a: Secondary | ICD-10-CM | POA: Diagnosis not present

## 2022-08-16 DIAGNOSIS — C672 Malignant neoplasm of lateral wall of bladder: Secondary | ICD-10-CM | POA: Diagnosis not present

## 2022-08-30 ENCOUNTER — Ambulatory Visit: Payer: Medicare Other | Attending: Cardiology | Admitting: Cardiology

## 2022-08-30 ENCOUNTER — Encounter: Payer: Self-pay | Admitting: Cardiology

## 2022-08-30 VITALS — BP 112/62 | HR 56 | Ht 72.25 in | Wt 185.0 lb

## 2022-08-30 DIAGNOSIS — I2510 Atherosclerotic heart disease of native coronary artery without angina pectoris: Secondary | ICD-10-CM | POA: Diagnosis not present

## 2022-08-30 DIAGNOSIS — E1100 Type 2 diabetes mellitus with hyperosmolarity without nonketotic hyperglycemic-hyperosmolar coma (NKHHC): Secondary | ICD-10-CM

## 2022-08-30 DIAGNOSIS — Z951 Presence of aortocoronary bypass graft: Secondary | ICD-10-CM | POA: Diagnosis not present

## 2022-08-30 DIAGNOSIS — E785 Hyperlipidemia, unspecified: Secondary | ICD-10-CM | POA: Diagnosis not present

## 2022-08-30 DIAGNOSIS — I2583 Coronary atherosclerosis due to lipid rich plaque: Secondary | ICD-10-CM
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
## 2022-08-30 NOTE — Patient Instructions (Signed)
Medication Instructions:  Please discontinue your potassium. Continue all other medications as listed.  *If you need a refill on your cardiac medications before your next appointment, please call your pharmacy*  Follow-Up: At [facility], you and your health needs are our priority.  As part of our continuing mission to provide you with exceptional heart care, we have created designated Provider Care Teams.  These Care Teams include your primary Cardiologist (physician) and Advanced Practice Providers (APPs -  Physician Assistants and Nurse Practitioners) who all work together to provide you with the care you need, when you need it.  We recommend signing up for the patient portal called "MyChart".  Sign up information is provided on this After Visit Summary.  MyChart is used to connect with patients for Virtual Visits (Telemedicine).  Patients are able to view lab/test results, encounter notes, upcoming appointments, etc.  Non-urgent messages can be sent to your provider as well.   To learn more about what you can do with MyChart, go to [email or web address].    Your next appointment:   6 month(s)  Provider:   Candee Furbish, MD

## 2022-09-27 ENCOUNTER — Other Ambulatory Visit: Payer: Self-pay | Admitting: Cardiology

## 2022-09-29 ENCOUNTER — Ambulatory Visit: Payer: Medicare Other | Admitting: Podiatry

## 2022-09-29 ENCOUNTER — Encounter: Payer: Self-pay | Admitting: Podiatry

## 2022-09-29 DIAGNOSIS — B351 Tinea unguium: Secondary | ICD-10-CM | POA: Diagnosis not present

## 2022-09-29 DIAGNOSIS — D2372 Other benign neoplasm of skin of left lower limb, including hip: Secondary | ICD-10-CM | POA: Diagnosis not present

## 2022-09-29 DIAGNOSIS — E1142 Type 2 diabetes mellitus with diabetic polyneuropathy: Secondary | ICD-10-CM

## 2022-09-29 DIAGNOSIS — M79676 Pain in unspecified toe(s): Secondary | ICD-10-CM

## 2022-09-29 DIAGNOSIS — D689 Coagulation defect, unspecified: Secondary | ICD-10-CM

## 2022-09-29 NOTE — Progress Notes (Signed)
He presents today chief complaint of diabetic numbness and tingling vascular disease heart disease and painful toenails and callus.  Objective: Pulses remain strong and palpable neurologic sensorium is unchanged.  He has a mild reactive benign skin lesion lateral aspect PIPJ fourth digit left foot.  Does not appear to be open or any skin infection.  Otherwise toenails are long thick dystrophic.  Assessment: Pain limb secondary to onychomycosis and benign skin lesion.  He he is on blood thinner.  Plan: Debridement of toenails 1 through 5 bilateral debridement of benign skin lesion.

## 2022-11-14 DIAGNOSIS — I10 Essential (primary) hypertension: Secondary | ICD-10-CM | POA: Diagnosis not present

## 2022-11-14 DIAGNOSIS — E1165 Type 2 diabetes mellitus with hyperglycemia: Secondary | ICD-10-CM | POA: Diagnosis not present

## 2022-11-14 DIAGNOSIS — E782 Mixed hyperlipidemia: Secondary | ICD-10-CM | POA: Diagnosis not present

## 2022-11-14 DIAGNOSIS — I2510 Atherosclerotic heart disease of native coronary artery without angina pectoris: Secondary | ICD-10-CM | POA: Diagnosis not present

## 2022-11-14 DIAGNOSIS — E538 Deficiency of other specified B group vitamins: Secondary | ICD-10-CM | POA: Diagnosis not present

## 2022-11-14 DIAGNOSIS — N1831 Chronic kidney disease, stage 3a: Secondary | ICD-10-CM | POA: Diagnosis not present

## 2022-11-14 DIAGNOSIS — E1142 Type 2 diabetes mellitus with diabetic polyneuropathy: Secondary | ICD-10-CM | POA: Diagnosis not present

## 2022-12-05 DIAGNOSIS — R222 Localized swelling, mass and lump, trunk: Secondary | ICD-10-CM | POA: Diagnosis not present

## 2022-12-06 ENCOUNTER — Other Ambulatory Visit: Payer: Self-pay | Admitting: Family Medicine

## 2022-12-06 DIAGNOSIS — N644 Mastodynia: Secondary | ICD-10-CM

## 2022-12-06 DIAGNOSIS — R222 Localized swelling, mass and lump, trunk: Secondary | ICD-10-CM

## 2022-12-13 ENCOUNTER — Ambulatory Visit
Admission: RE | Admit: 2022-12-13 | Discharge: 2022-12-13 | Disposition: A | Discharge status: Home / Self Care | Payer: Medicare Other | Source: Ambulatory Visit | Attending: Family Medicine | Admitting: Family Medicine

## 2022-12-13 ENCOUNTER — Ambulatory Visit: Admission: RE | Admit: 2022-12-13 | Payer: Medicare Other | Source: Ambulatory Visit

## 2022-12-13 DIAGNOSIS — N62 Hypertrophy of breast: Secondary | ICD-10-CM | POA: Diagnosis not present

## 2022-12-13 DIAGNOSIS — N644 Mastodynia: Secondary | ICD-10-CM

## 2022-12-13 DIAGNOSIS — R222 Localized swelling, mass and lump, trunk: Secondary | ICD-10-CM

## 2022-12-22 ENCOUNTER — Other Ambulatory Visit: Payer: Self-pay | Admitting: Cardiology

## 2023-01-05 ENCOUNTER — Ambulatory Visit: Payer: Medicare Other | Admitting: Podiatry

## 2023-01-05 ENCOUNTER — Encounter: Payer: Self-pay | Admitting: Podiatry

## 2023-01-05 DIAGNOSIS — E1142 Type 2 diabetes mellitus with diabetic polyneuropathy: Secondary | ICD-10-CM | POA: Diagnosis not present

## 2023-01-05 DIAGNOSIS — M79676 Pain in unspecified toe(s): Secondary | ICD-10-CM | POA: Diagnosis not present

## 2023-01-05 DIAGNOSIS — D2372 Other benign neoplasm of skin of left lower limb, including hip: Secondary | ICD-10-CM | POA: Diagnosis not present

## 2023-01-05 DIAGNOSIS — B351 Tinea unguium: Secondary | ICD-10-CM

## 2023-01-05 DIAGNOSIS — D689 Coagulation defect, unspecified: Secondary | ICD-10-CM | POA: Diagnosis not present

## 2023-01-05 NOTE — Progress Notes (Signed)
[name] presents today states that he just needs his corns and calluses and toenails trimmed.  He has diabetic peripheral neuropathy and is on blood thinner.  Objective: Vital signs stable alert and oriented x 3 there is no erythema Dem salines drainage noted toenails are long thick yellow dystrophic clinically mycotic pulses remain palpable at 1/4 bilaterally.  Toenails are long thick dystrophic he has a painful benign skin lesion fourth toe left foot.  Assessment: Diabetic peripheral neuropathy blood thinner pain limb secondary to onychomycosis and benign skin lesion.  Plan: Debrided benign skin lesion debrided nails 1 through 5 bilateral.  Follow-up with him in 3 months

## 2023-01-13 DIAGNOSIS — E1122 Type 2 diabetes mellitus with diabetic chronic kidney disease: Secondary | ICD-10-CM | POA: Diagnosis not present

## 2023-01-13 DIAGNOSIS — E1151 Type 2 diabetes mellitus with diabetic peripheral angiopathy without gangrene: Secondary | ICD-10-CM | POA: Diagnosis not present

## 2023-01-13 DIAGNOSIS — Z794 Long term (current) use of insulin: Secondary | ICD-10-CM | POA: Diagnosis not present

## 2023-01-13 DIAGNOSIS — N1831 Chronic kidney disease, stage 3a: Secondary | ICD-10-CM | POA: Diagnosis not present

## 2023-02-08 DIAGNOSIS — Z961 Presence of intraocular lens: Secondary | ICD-10-CM | POA: Diagnosis not present

## 2023-02-08 DIAGNOSIS — H5213 Myopia, bilateral: Secondary | ICD-10-CM | POA: Diagnosis not present

## 2023-02-08 DIAGNOSIS — E119 Type 2 diabetes mellitus without complications: Secondary | ICD-10-CM | POA: Diagnosis not present

## 2023-04-11 ENCOUNTER — Ambulatory Visit: Payer: Medicare Other | Admitting: Podiatry

## 2023-04-11 ENCOUNTER — Encounter: Payer: Self-pay | Admitting: Podiatry

## 2023-04-11 DIAGNOSIS — D2372 Other benign neoplasm of skin of left lower limb, including hip: Secondary | ICD-10-CM | POA: Diagnosis not present

## 2023-04-11 DIAGNOSIS — E1142 Type 2 diabetes mellitus with diabetic polyneuropathy: Secondary | ICD-10-CM | POA: Diagnosis not present

## 2023-04-11 DIAGNOSIS — D689 Coagulation defect, unspecified: Secondary | ICD-10-CM

## 2023-04-11 DIAGNOSIS — M79676 Pain in unspecified toe(s): Secondary | ICD-10-CM

## 2023-04-11 DIAGNOSIS — B351 Tinea unguium: Secondary | ICD-10-CM | POA: Diagnosis not present

## 2023-04-11 NOTE — Progress Notes (Signed)
He presents today for follow-up of his diabetes and diabetic foot neuropathy.  He is also here for chief complaint of painful elongated toenails and calluses.  Objective: Vital signs are stable he is alert oriented x 3 there is no erythema edema salines drainage or odor toenails are long thick yellow dystrophic with mycotic sharply abraded.  He has strong palpable pulses.  No open lesions or wounds.  Callus to the lateral aspect of the PIPJ fourth digit left foot.  Assessment: Diabetic peripheral neuropathy with sharply incurvated painful dystrophic nails and painful soft tissue lesion.  Plan: Discussed etiology pathology and surgical therapies at this point tramp nails 1 through 5 bilaterally debrided benign skin lesion discussed diabetes and diabetic skin care.  Will follow-up with him on an 35-month rotating cycle.

## 2023-04-18 ENCOUNTER — Encounter: Payer: Self-pay | Admitting: Cardiology

## 2023-04-18 ENCOUNTER — Ambulatory Visit: Payer: Medicare Other | Attending: Cardiology | Admitting: Cardiology

## 2023-04-18 VITALS — BP 118/66 | HR 52 | Ht 72.25 in | Wt 185.0 lb

## 2023-04-18 DIAGNOSIS — Z951 Presence of aortocoronary bypass graft: Secondary | ICD-10-CM

## 2023-04-18 DIAGNOSIS — I2510 Atherosclerotic heart disease of native coronary artery without angina pectoris: Secondary | ICD-10-CM

## 2023-04-18 DIAGNOSIS — E785 Hyperlipidemia, unspecified: Secondary | ICD-10-CM

## 2023-04-18 DIAGNOSIS — I2583 Coronary atherosclerosis due to lipid rich plaque: Secondary | ICD-10-CM

## 2023-04-18 DIAGNOSIS — Z86711 Personal history of pulmonary embolism: Secondary | ICD-10-CM

## 2023-04-18 DIAGNOSIS — I10 Essential (primary) hypertension: Secondary | ICD-10-CM

## 2023-04-18 NOTE — Patient Instructions (Signed)
 Medication Instructions:  The current medical regimen is effective;  continue present plan and medications.  *If you need a refill on your cardiac medications before your next appointment, please call your pharmacy*  Follow-Up: At [facility], you and your health needs are our priority.  As part of our continuing mission to provide you with exceptional heart care, we have created designated Provider Care Teams.  These Care Teams include your primary Cardiologist (physician) and Advanced Practice Providers (APPs -  Physician Assistants and Nurse Practitioners) who all work together to provide you with the care you need, when you need it.  We recommend signing up for the patient portal called "MyChart".  Sign up information is provided on this After Visit Summary.  MyChart is used to connect with patients for Virtual Visits (Telemedicine).  Patients are able to view lab/test results, encounter notes, upcoming appointments, etc.  Non-urgent messages can be sent to your provider as well.   To learn more about what you can do with MyChart, go to [email or web address].    Your next appointment:   6 month(s)  Provider:   Jari Favre, PA-C, Robin Searing, NP, Jacolyn Reedy, PA-C, Eligha Bridegroom, NP, Tereso Newcomer, PA-C, or Perlie Gold, PA-C

## 2023-04-18 NOTE — Progress Notes (Signed)
Cardiology Office Note:  .   Date:  04/18/2023  ID:  Truett Perna, DOB 1945/06/01, MRN [id number] PCP: Darrow Bussing, MD  [facility] Providers Cardiologist:  Donato Schultz, MD     History of Present Illness: .   ERIN KELLIE is a 77 y.o. male Discussed the use of AI scribe software for clinical note transcription with the patient, who gave verbal consent to proceed.  History of Present Illness   The patient, a 77 year old with a history of coronary artery disease status post CABG approximately a year ago, has been doing well overall. He has been actively participating in cardiac rehab, yard work, and gym activities at [employer]. He has an IVC filter in place due to a prior history of PE, which occurred after bladder surgery. Other significant medical conditions include hypertension and diabetes. He has a 25-year history of smoking and a family history of MI and heart failure.  The patient is currently on aspirin 81 mg, Eliquis 5 mg twice a day, Crestor 20 mg, and Zetia 10 mg a day, with an excellent LDL of 38. He reports occasional chest pain, which is not associated with exertion but occurs when sitting in a recliner. The pain is brief and resolves quickly.  He also mentions a concern about his heart rate, which was reportedly too fast during physical therapy. As a result, he has been splitting his metoprolol tablets in half. He is considering changing his insurance, which may affect his medication coverage. He has nitroglycerin on hand for emergencies but has not needed to use it recently.            Studies Reviewed: Marland Kitchen   EKG Interpretation Date/Time:  Tuesday April 18 2023 10:23:46 EST Ventricular Rate:  52 PR Interval:  162 QRS Duration:  100 QT Interval:  434 QTC Calculation: 403 R Axis:   79  Text Interpretation: Sinus bradycardia When compared with ECG of 17-Feb-2022 06:47, No significant change was found Confirmed by Donato Schultz ([id number]) on  04/18/2023 10:40:38 AM    Results LABS Hb: 13.1 g/dL Cr: 1.1 mg/dL  DIAGNOSTIC EKG: Normal (04/18/2023)  Risk Assessment/Calculations:            Physical Exam:   VS:  BP 118/66   Pulse (!) 52   Ht 6' 0.25" (1.835 m)   Wt 185 lb (83.9 kg)   SpO2 96%   BMI 24.92 kg/m    Wt Readings from Last 3 Encounters:  04/18/23 185 lb (83.9 kg)  08/30/22 185 lb (83.9 kg)  07/11/22 188 lb 7.9 oz (85.5 kg)    GEN: Well nourished, well developed in no acute distress NECK: No JVD; No carotid bruits CARDIAC: RRR, no murmurs, no rubs, no gallops RESPIRATORY:  Clear to auscultation without rales, wheezing or rhonchi  ABDOMEN: Soft, non-tender, non-distended EXTREMITIES:  No edema; No deformity   ASSESSMENT AND PLAN: .    Assessment and Plan    Coronary Artery Disease (CAD) status post Coronary Artery Bypass Grafting (CABG) Doing well post-CABG (02/16/2022). Participating in cardiac rehab, yard work, and gym activities. No significant valvular abnormalities noted during surgery. Occasional chest pain likely musculoskeletal. EKG and labs normal. LDL excellent at 38. Current medications stabilizing condition effectively. - Continue Aspirin 81 mg daily, Eliquis 5 mg twice a day, Crestor 20 mg daily, Zetia 10 mg daily - Schedule six-month follow-up with APP and one-year follow-up with me  Bradycardia Heart rate slow today. Previous metoprolol dose adjustment due to  fast heart rate during physical therapy. Monitor closely. Potential to stop metoprolol if bradycardia persists. - Monitor heart rate closely - Consider stopping metoprolol if bradycardia persists  Hypertension Blood pressure management well-controlled. - Continue current management  Diabetes Mellitus Management well-controlled. - Continue current management  Pulmonary Embolism (PE) IVC filter in place post-bladder surgery. Continues on Eliquis for anticoagulation. - Continue Eliquis 5 mg twice a day  General Health  Maintenance Inquired about necessity of continuing nitroglycerin and another medication. Advised nitroglycerin can be discontinued if not needed. Discussed potential insurance changes impacting medication costs. - Discontinue nitroglycerin if not needed - Review and adjust medication list as necessary  Follow-up - Schedule six-month follow-up with APP - Schedule one-year follow-up with cardiologist.               Signed, Donato Schultz, MD

## 2023-05-02 DIAGNOSIS — I2510 Atherosclerotic heart disease of native coronary artery without angina pectoris: Secondary | ICD-10-CM | POA: Diagnosis not present

## 2023-05-02 DIAGNOSIS — E1165 Type 2 diabetes mellitus with hyperglycemia: Secondary | ICD-10-CM | POA: Diagnosis not present

## 2023-05-02 DIAGNOSIS — E1142 Type 2 diabetes mellitus with diabetic polyneuropathy: Secondary | ICD-10-CM | POA: Diagnosis not present

## 2023-05-02 DIAGNOSIS — E538 Deficiency of other specified B group vitamins: Secondary | ICD-10-CM | POA: Diagnosis not present

## 2023-05-02 DIAGNOSIS — I10 Essential (primary) hypertension: Secondary | ICD-10-CM | POA: Diagnosis not present

## 2023-05-02 DIAGNOSIS — N1831 Chronic kidney disease, stage 3a: Secondary | ICD-10-CM | POA: Diagnosis not present

## 2023-05-02 DIAGNOSIS — E559 Vitamin D deficiency, unspecified: Secondary | ICD-10-CM | POA: Diagnosis not present

## 2023-05-02 DIAGNOSIS — E782 Mixed hyperlipidemia: Secondary | ICD-10-CM | POA: Diagnosis not present

## 2023-05-08 ENCOUNTER — Telehealth: Payer: Self-pay | Admitting: Cardiology

## 2023-05-08 MED ORDER — METOPROLOL TARTRATE 25 MG PO TABS: 12.5000 mg | ORAL_TABLET | Freq: Two times a day (BID) | ORAL | 3 refills | Status: DC

## 2023-05-08 NOTE — Telephone Encounter (Signed)
*  STAT* If patient is at the pharmacy, call can be transferred to refill team.   1. Which medications need to be refilled? (please list name of each medication and dose if known)   metoprolol tartrate (LOPRESSOR) 25 MG tablet     2. Would you like to learn more about the convenience, safety, & potential cost savings by using the [facility]? No   3. Are you open to using the [facility] (Type [facility] ) No   4. Which pharmacy/location (including street and city if local pharmacy) is medication to be sent to? [facility] - [place], [place] - [address]    5. Do they need a 30 day or 90 day supply? 90 day

## 2023-05-21 ENCOUNTER — Other Ambulatory Visit: Payer: Self-pay | Admitting: Surgical

## 2023-05-22 ENCOUNTER — Other Ambulatory Visit: Payer: Self-pay | Admitting: Cardiology

## 2023-05-22 MED ORDER — METOPROLOL TARTRATE 25 MG PO TABS: 12.5000 mg | ORAL_TABLET | Freq: Two times a day (BID) | ORAL | 3 refills | Status: DC

## 2023-07-11 ENCOUNTER — Encounter: Payer: Self-pay | Admitting: Podiatry

## 2023-07-11 ENCOUNTER — Ambulatory Visit: Payer: Medicare Other | Admitting: Podiatry

## 2023-07-11 DIAGNOSIS — B351 Tinea unguium: Secondary | ICD-10-CM | POA: Diagnosis not present

## 2023-07-11 DIAGNOSIS — D2372 Other benign neoplasm of skin of left lower limb, including hip: Secondary | ICD-10-CM

## 2023-07-11 DIAGNOSIS — E1142 Type 2 diabetes mellitus with diabetic polyneuropathy: Secondary | ICD-10-CM

## 2023-07-11 DIAGNOSIS — D689 Coagulation defect, unspecified: Secondary | ICD-10-CM

## 2023-07-11 DIAGNOSIS — M79676 Pain in unspecified toe(s): Secondary | ICD-10-CM

## 2023-07-12 NOTE — Progress Notes (Signed)
He presents today chief complaint of painful elongated toenails.  States that his A1c is improving.  He has couple of painful calluses as well plantar aspect of the forefoot.  Objective: Vital signs stable alert oriented x 3.  There is no erythema edema salines drainage odor pulses are minimally palpable.  No change in neurologic sensorium nails are long dystrophic sharply incurvated tender.  Assessment: Benign skin lesions interdigitally and plantar aspect of the forefoot.  Pain in limb secondary to onychomycosis.  Diabetic peripheral neuropathy and chronic blood thinner.  Plan: Debrided benign skin lesions debrided mycotic nails.

## 2023-07-13 ENCOUNTER — Ambulatory Visit: Payer: Medicare Other | Admitting: Podiatry

## 2023-07-25 DIAGNOSIS — Z79899 Other long term (current) drug therapy: Secondary | ICD-10-CM | POA: Diagnosis not present

## 2023-07-25 DIAGNOSIS — E7800 Pure hypercholesterolemia, unspecified: Secondary | ICD-10-CM | POA: Diagnosis not present

## 2023-07-27 DIAGNOSIS — N1831 Chronic kidney disease, stage 3a: Secondary | ICD-10-CM | POA: Diagnosis not present

## 2023-07-27 DIAGNOSIS — Z0000 Encounter for general adult medical examination without abnormal findings: Secondary | ICD-10-CM | POA: Diagnosis not present

## 2023-07-27 DIAGNOSIS — I2510 Atherosclerotic heart disease of native coronary artery without angina pectoris: Secondary | ICD-10-CM | POA: Diagnosis not present

## 2023-07-27 DIAGNOSIS — Z1211 Encounter for screening for malignant neoplasm of colon: Secondary | ICD-10-CM | POA: Diagnosis not present

## 2023-07-27 DIAGNOSIS — E7800 Pure hypercholesterolemia, unspecified: Secondary | ICD-10-CM | POA: Diagnosis not present

## 2023-07-27 DIAGNOSIS — Z86711 Personal history of pulmonary embolism: Secondary | ICD-10-CM | POA: Diagnosis not present

## 2023-07-27 DIAGNOSIS — Z79899 Other long term (current) drug therapy: Secondary | ICD-10-CM | POA: Diagnosis not present

## 2023-07-27 DIAGNOSIS — E1151 Type 2 diabetes mellitus with diabetic peripheral angiopathy without gangrene: Secondary | ICD-10-CM | POA: Diagnosis not present

## 2023-07-27 DIAGNOSIS — Z951 Presence of aortocoronary bypass graft: Secondary | ICD-10-CM | POA: Diagnosis not present

## 2023-07-28 DIAGNOSIS — Z1211 Encounter for screening for malignant neoplasm of colon: Secondary | ICD-10-CM | POA: Diagnosis not present

## 2023-08-21 DIAGNOSIS — R3914 Feeling of incomplete bladder emptying: Secondary | ICD-10-CM | POA: Diagnosis not present

## 2023-08-21 DIAGNOSIS — C678 Malignant neoplasm of overlapping sites of bladder: Secondary | ICD-10-CM | POA: Diagnosis not present

## 2023-08-29 DIAGNOSIS — E1142 Type 2 diabetes mellitus with diabetic polyneuropathy: Secondary | ICD-10-CM | POA: Diagnosis not present

## 2023-08-29 DIAGNOSIS — E782 Mixed hyperlipidemia: Secondary | ICD-10-CM | POA: Diagnosis not present

## 2023-08-29 DIAGNOSIS — I2510 Atherosclerotic heart disease of native coronary artery without angina pectoris: Secondary | ICD-10-CM | POA: Diagnosis not present

## 2023-08-29 DIAGNOSIS — Z951 Presence of aortocoronary bypass graft: Secondary | ICD-10-CM | POA: Diagnosis not present

## 2023-08-29 DIAGNOSIS — I10 Essential (primary) hypertension: Secondary | ICD-10-CM | POA: Diagnosis not present

## 2023-08-29 DIAGNOSIS — E1165 Type 2 diabetes mellitus with hyperglycemia: Secondary | ICD-10-CM | POA: Diagnosis not present

## 2023-09-02 ENCOUNTER — Other Ambulatory Visit: Payer: Self-pay | Admitting: Cardiology

## 2023-09-06 ENCOUNTER — Other Ambulatory Visit: Payer: Self-pay | Admitting: Cardiology

## 2023-10-10 ENCOUNTER — Ambulatory Visit: Payer: Medicare Other | Admitting: Podiatry

## 2023-10-10 ENCOUNTER — Encounter: Payer: Self-pay | Admitting: Podiatry

## 2023-10-10 DIAGNOSIS — D2372 Other benign neoplasm of skin of left lower limb, including hip: Secondary | ICD-10-CM

## 2023-10-10 DIAGNOSIS — E1142 Type 2 diabetes mellitus with diabetic polyneuropathy: Secondary | ICD-10-CM

## 2023-10-10 DIAGNOSIS — B351 Tinea unguium: Secondary | ICD-10-CM

## 2023-10-10 DIAGNOSIS — D689 Coagulation defect, unspecified: Secondary | ICD-10-CM

## 2023-10-10 DIAGNOSIS — M79676 Pain in unspecified toe(s): Secondary | ICD-10-CM | POA: Diagnosis not present

## 2023-10-10 NOTE — Progress Notes (Signed)
 He presents today chief complaint of painful elongated toenails.  States that his A1c is improving.  He has couple of painful calluses as well plantar aspect of the forefoot.  Objective: Vital signs stable alert oriented x 3.  There is no erythema edema salines drainage odor pulses are minimally palpable.  No change in neurologic sensorium nails are long dystrophic sharply incurvated tender.  Assessment: Benign skin lesions interdigitally and plantar aspect of the forefoot.  Pain in limb secondary to onychomycosis.  Diabetic peripheral neuropathy and chronic blood thinner.  Plan: Debrided benign skin lesions debrided mycotic nails.

## 2023-11-17 DIAGNOSIS — Z85828 Personal history of other malignant neoplasm of skin: Secondary | ICD-10-CM | POA: Diagnosis not present

## 2023-11-17 DIAGNOSIS — L218 Other seborrheic dermatitis: Secondary | ICD-10-CM | POA: Diagnosis not present

## 2023-11-17 DIAGNOSIS — L244 Irritant contact dermatitis due to drugs in contact with skin: Secondary | ICD-10-CM | POA: Diagnosis not present

## 2023-11-17 DIAGNOSIS — L570 Actinic keratosis: Secondary | ICD-10-CM | POA: Diagnosis not present

## 2023-11-27 ENCOUNTER — Encounter: Payer: Self-pay | Admitting: Cardiology

## 2023-11-27 ENCOUNTER — Ambulatory Visit: Attending: Cardiology | Admitting: Cardiology

## 2023-11-27 VITALS — BP 128/60 | HR 58 | Resp 16 | Ht 72.0 in | Wt 181.2 lb

## 2023-11-27 DIAGNOSIS — E785 Hyperlipidemia, unspecified: Secondary | ICD-10-CM

## 2023-11-27 DIAGNOSIS — Z951 Presence of aortocoronary bypass graft: Secondary | ICD-10-CM | POA: Diagnosis not present

## 2023-11-27 DIAGNOSIS — I2583 Coronary atherosclerosis due to lipid rich plaque: Secondary | ICD-10-CM

## 2023-11-27 DIAGNOSIS — I2510 Atherosclerotic heart disease of native coronary artery without angina pectoris: Secondary | ICD-10-CM

## 2023-11-27 DIAGNOSIS — I2699 Other pulmonary embolism without acute cor pulmonale: Secondary | ICD-10-CM

## 2023-11-27 DIAGNOSIS — E1165 Type 2 diabetes mellitus with hyperglycemia: Secondary | ICD-10-CM | POA: Diagnosis not present

## 2023-11-27 DIAGNOSIS — E1169 Type 2 diabetes mellitus with other specified complication: Secondary | ICD-10-CM | POA: Diagnosis not present

## 2023-11-27 DIAGNOSIS — J449 Chronic obstructive pulmonary disease, unspecified: Secondary | ICD-10-CM | POA: Diagnosis not present

## 2023-11-27 DIAGNOSIS — E119 Type 2 diabetes mellitus without complications: Secondary | ICD-10-CM | POA: Diagnosis not present

## 2023-11-27 NOTE — Progress Notes (Signed)
 Cardiology Office Note:  .   Date:  11/27/2023  ID:  [name], DOB 10-18-1945, MRN [id number] PCP: Regino Slater, MD  [facility] Providers Cardiologist:  Oneil Parchment, MD     History of Present Illness: .   [name] is a 78 y.o. male Discussed the use of AI scribe software for clinical note transcription with the patient, who gave verbal consent to proceed.  History of Present Illness [name] is a 78 year old male with coronary artery disease post-CABG who presents for follow-up.  He underwent coronary artery bypass grafting (CABG) on February 16, 2022, and has since completed cardiac rehabilitation successfully. He feels well overall, with occasional tightness in the chest occurring once or twice a month, which is not painful and not associated with exertion. He also experiences shortness of breath occasionally, but it does not interfere with his daily activities.  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 He continues to take Eliquis  5 mg twice daily for anticoagulation.  He has a 25-year history of smoking and a family history of myocardial infarction. His LDL cholesterol is well-controlled at 38 mg/dL on rosuvastatin  20 mg and Zetia  10 mg daily. His hemoglobin is 14.5 g/dL, and his creatinine is 1.1 mg/dL, indicating good kidney function. His A1c was 7.7% in April, which is in the diabetic range.      Studies Reviewed: .        Results LABS Cr: 1.1 mg/dL Hb: 85.4 g/dL J8r: 2.2% (95/7974) LDL: 48 mg/dL  DIAGNOSTIC EKG: Sinus rhythm, heart rate 52 bpm Risk Assessment/Calculations:            Physical Exam:   VS:  BP 128/60 (BP Location: Left Arm, Patient Position: Sitting, Cuff Size: Normal)   Pulse (!) 58   Resp 16   Ht 6' (1.829 m)   Wt 181 lb 3.2 oz (82.2 kg)   SpO2 98%   BMI 24.58 kg/m    Wt Readings from Last 3 Encounters:  11/27/23 181 lb 3.2 oz  (82.2 kg)  04/18/23 185 lb (83.9 kg)  08/30/22 185 lb (83.9 kg)    GEN: Well nourished, well developed in no acute distress NECK: No JVD; No carotid bruits CARDIAC: RRR, no murmurs, no rubs, no gallops RESPIRATORY:  Clear to auscultation without rales, wheezing or rhonchi  ABDOMEN: Soft, non-tender, non-distended EXTREMITIES:  No edema; No deformity   ASSESSMENT AND PLAN: .    Assessment and Plan Assessment & Plan Coronary Artery Disease (CAD) post-CABG Status post-CABG in September 2023. Successfully completed cardiac rehab. LDL cholesterol is excellent at 48 mg/dL on current medication regimen. Reports occasional tightness, likely musculoskeletal, and not concerning for cardiac etiology. Continues to experience occasional shortness of breath, not limiting daily activities. - Continue Crestor  20 mg daily - Continue Zetia  10 mg daily - Discontinue aspirin  81 mg daily  Bradycardia Heart rate has been consistently on the slower side, with today's reading at 58 bpm. Bradycardia is likely related to metoprolol  use. - Discontinue metoprolol  12.5 BID  Type 2 Diabetes Mellitus A1c was 7.7% in April, indicating suboptimal control of diabetes.  Management per primary care  Pulmonary Embolism Continues on Eliquis  5 mg twice a day due to history of pulmonary embolism post-bladder surgery. IVC filter remains in place. - Continue Eliquis  5 mg twice a day          Signed, Oneil Parchment, MD

## 2023-11-27 NOTE — Patient Instructions (Signed)
 Medication Instructions:  Please discontinue your Metoprolol  and Aspirin . Continue all other medications as listed.  *If you need a refill on your cardiac medications before your next appointment, please call your pharmacy*  Follow-Up: At [facility], you and your health needs are our priority.  As part of our continuing mission to provide you with exceptional heart care, our providers are all part of one team.  This team includes your primary Cardiologist (physician) and Advanced Practice Providers or APPs (Physician Assistants and Nurse Practitioners) who all work together to provide you with the care you need, when you need it.  Your next appointment:   1 year(s)  Provider:   One of our Advanced Practice Providers (APPs): Morse Clause, PA-C  Lamarr Satterfield, NP Miriam Shams, NP  Olivia Pavy, PA-C Josefa Beauvais, NP  Leontine Salen, PA-C Orren Fabry, PA-C  Doolittle, PA-C Ernest Dick, NP  Damien Braver, NP Jon Hails, PA-C  Waddell Donath, PA-C    Dayna Dunn, PA-C  Scott Weaver, PA-C Lum Louis, NP Katlyn West, NP Callie Goodrich, PA-C  Evan Williams, PA-C Sheng Haley, PA-C  Xika Zhao, NP Kathleen Johnson, PA-C       We recommend signing up for the patient portal called MyChart.  Sign up information is provided on this After Visit Summary.  MyChart is used to connect with patients for Virtual Visits (Telemedicine).  Patients are able to view lab/test results, encounter notes, upcoming appointments, etc.  Non-urgent messages can be sent to your provider as well.   To learn more about what you can do with MyChart, go to [email or web address].

## 2023-12-28 DIAGNOSIS — E1165 Type 2 diabetes mellitus with hyperglycemia: Secondary | ICD-10-CM | POA: Diagnosis not present

## 2023-12-28 DIAGNOSIS — E119 Type 2 diabetes mellitus without complications: Secondary | ICD-10-CM | POA: Diagnosis not present

## 2023-12-28 DIAGNOSIS — J449 Chronic obstructive pulmonary disease, unspecified: Secondary | ICD-10-CM | POA: Diagnosis not present

## 2023-12-28 DIAGNOSIS — E1169 Type 2 diabetes mellitus with other specified complication: Secondary | ICD-10-CM | POA: Diagnosis not present

## 2024-01-01 DIAGNOSIS — I2510 Atherosclerotic heart disease of native coronary artery without angina pectoris: Secondary | ICD-10-CM | POA: Diagnosis not present

## 2024-01-01 DIAGNOSIS — E782 Mixed hyperlipidemia: Secondary | ICD-10-CM | POA: Diagnosis not present

## 2024-01-01 DIAGNOSIS — E1151 Type 2 diabetes mellitus with diabetic peripheral angiopathy without gangrene: Secondary | ICD-10-CM | POA: Diagnosis not present

## 2024-01-01 DIAGNOSIS — I10 Essential (primary) hypertension: Secondary | ICD-10-CM | POA: Diagnosis not present

## 2024-01-01 DIAGNOSIS — E1165 Type 2 diabetes mellitus with hyperglycemia: Secondary | ICD-10-CM | POA: Diagnosis not present

## 2024-01-01 DIAGNOSIS — E1142 Type 2 diabetes mellitus with diabetic polyneuropathy: Secondary | ICD-10-CM | POA: Diagnosis not present

## 2024-01-01 DIAGNOSIS — J449 Chronic obstructive pulmonary disease, unspecified: Secondary | ICD-10-CM | POA: Diagnosis not present

## 2024-01-09 ENCOUNTER — Ambulatory Visit: Admitting: Podiatry

## 2024-01-16 ENCOUNTER — Ambulatory Visit (INDEPENDENT_AMBULATORY_CARE_PROVIDER_SITE_OTHER): Admitting: Podiatry

## 2024-01-16 ENCOUNTER — Encounter: Payer: Self-pay | Admitting: Podiatry

## 2024-01-16 DIAGNOSIS — D2372 Other benign neoplasm of skin of left lower limb, including hip: Secondary | ICD-10-CM

## 2024-01-16 DIAGNOSIS — B351 Tinea unguium: Secondary | ICD-10-CM

## 2024-01-16 DIAGNOSIS — D689 Coagulation defect, unspecified: Secondary | ICD-10-CM | POA: Diagnosis not present

## 2024-01-16 DIAGNOSIS — M79676 Pain in unspecified toe(s): Secondary | ICD-10-CM

## 2024-01-16 NOTE — Progress Notes (Signed)
 He presents today chief complaint of painful elongated toenails.  States that his A1c is improving.  He has couple of painful calluses as well plantar aspect of the forefoot.  Objective: Vital signs stable alert oriented x 3.  There is no erythema edema salines drainage odor pulses are minimally palpable.  No change in neurologic sensorium nails are long dystrophic sharply incurvated tender.  Assessment: Benign skin lesions interdigitally and plantar aspect of the forefoot.  Pain in limb secondary to onychomycosis.  Diabetic peripheral neuropathy and chronic blood thinner.  Plan: Debrided benign skin lesions debrided mycotic nails.

## 2024-01-24 DIAGNOSIS — I82531 Chronic embolism and thrombosis of right popliteal vein: Secondary | ICD-10-CM | POA: Diagnosis not present

## 2024-01-24 DIAGNOSIS — Z951 Presence of aortocoronary bypass graft: Secondary | ICD-10-CM | POA: Diagnosis not present

## 2024-01-24 DIAGNOSIS — Z79899 Other long term (current) drug therapy: Secondary | ICD-10-CM | POA: Diagnosis not present

## 2024-01-24 DIAGNOSIS — E1151 Type 2 diabetes mellitus with diabetic peripheral angiopathy without gangrene: Secondary | ICD-10-CM | POA: Diagnosis not present

## 2024-01-24 DIAGNOSIS — I2510 Atherosclerotic heart disease of native coronary artery without angina pectoris: Secondary | ICD-10-CM | POA: Diagnosis not present

## 2024-01-24 DIAGNOSIS — N1831 Chronic kidney disease, stage 3a: Secondary | ICD-10-CM | POA: Diagnosis not present

## 2024-01-24 DIAGNOSIS — E7800 Pure hypercholesterolemia, unspecified: Secondary | ICD-10-CM | POA: Diagnosis not present

## 2024-01-24 DIAGNOSIS — Z23 Encounter for immunization: Secondary | ICD-10-CM | POA: Diagnosis not present

## 2024-01-24 DIAGNOSIS — Z86711 Personal history of pulmonary embolism: Secondary | ICD-10-CM | POA: Diagnosis not present

## 2024-01-28 DIAGNOSIS — J449 Chronic obstructive pulmonary disease, unspecified: Secondary | ICD-10-CM | POA: Diagnosis not present

## 2024-01-28 DIAGNOSIS — E1165 Type 2 diabetes mellitus with hyperglycemia: Secondary | ICD-10-CM | POA: Diagnosis not present

## 2024-01-28 DIAGNOSIS — E1169 Type 2 diabetes mellitus with other specified complication: Secondary | ICD-10-CM | POA: Diagnosis not present

## 2024-01-28 DIAGNOSIS — E119 Type 2 diabetes mellitus without complications: Secondary | ICD-10-CM | POA: Diagnosis not present

## 2024-02-14 DIAGNOSIS — H11003 Unspecified pterygium of eye, bilateral: Secondary | ICD-10-CM | POA: Diagnosis not present

## 2024-02-14 DIAGNOSIS — E119 Type 2 diabetes mellitus without complications: Secondary | ICD-10-CM | POA: Diagnosis not present

## 2024-02-14 DIAGNOSIS — Z961 Presence of intraocular lens: Secondary | ICD-10-CM | POA: Diagnosis not present

## 2024-03-13 NOTE — Progress Notes (Addendum)
 [name]                                          MRN: [id number]   04/17/2024   The VBCI Quality Team Specialist reviewed this patient medical record for the purposes of chart review for care gap closure. The following were reviewed: chart review for care gap closure-kidney health evaluation for diabetes:uACR.    VBCI Quality Team

## 2024-03-14 DIAGNOSIS — D1801 Hemangioma of skin and subcutaneous tissue: Secondary | ICD-10-CM | POA: Diagnosis not present

## 2024-03-14 DIAGNOSIS — L814 Other melanin hyperpigmentation: Secondary | ICD-10-CM | POA: Diagnosis not present

## 2024-03-14 DIAGNOSIS — L821 Other seborrheic keratosis: Secondary | ICD-10-CM | POA: Diagnosis not present

## 2024-03-14 DIAGNOSIS — Z85828 Personal history of other malignant neoplasm of skin: Secondary | ICD-10-CM | POA: Diagnosis not present

## 2024-03-14 DIAGNOSIS — L738 Other specified follicular disorders: Secondary | ICD-10-CM | POA: Diagnosis not present

## 2024-03-14 DIAGNOSIS — L218 Other seborrheic dermatitis: Secondary | ICD-10-CM | POA: Diagnosis not present

## 2024-03-14 DIAGNOSIS — L570 Actinic keratosis: Secondary | ICD-10-CM | POA: Diagnosis not present

## 2024-04-16 ENCOUNTER — Encounter: Payer: Self-pay | Admitting: Podiatry

## 2024-04-16 ENCOUNTER — Ambulatory Visit: Admitting: Podiatry

## 2024-04-16 DIAGNOSIS — J449 Chronic obstructive pulmonary disease, unspecified: Secondary | ICD-10-CM | POA: Insufficient documentation

## 2024-04-16 DIAGNOSIS — Z86711 Personal history of pulmonary embolism: Secondary | ICD-10-CM | POA: Insufficient documentation

## 2024-04-16 DIAGNOSIS — D689 Coagulation defect, unspecified: Secondary | ICD-10-CM | POA: Diagnosis not present

## 2024-04-16 DIAGNOSIS — E1142 Type 2 diabetes mellitus with diabetic polyneuropathy: Secondary | ICD-10-CM | POA: Diagnosis not present

## 2024-04-16 DIAGNOSIS — D2372 Other benign neoplasm of skin of left lower limb, including hip: Secondary | ICD-10-CM | POA: Diagnosis not present

## 2024-04-16 DIAGNOSIS — N1831 Chronic kidney disease, stage 3a: Secondary | ICD-10-CM | POA: Insufficient documentation

## 2024-04-16 DIAGNOSIS — M179 Osteoarthritis of knee, unspecified: Secondary | ICD-10-CM | POA: Insufficient documentation

## 2024-04-16 DIAGNOSIS — E1165 Type 2 diabetes mellitus with hyperglycemia: Secondary | ICD-10-CM | POA: Insufficient documentation

## 2024-04-16 DIAGNOSIS — E782 Mixed hyperlipidemia: Secondary | ICD-10-CM | POA: Insufficient documentation

## 2024-04-16 DIAGNOSIS — Z23 Encounter for immunization: Secondary | ICD-10-CM | POA: Insufficient documentation

## 2024-04-16 DIAGNOSIS — B351 Tinea unguium: Secondary | ICD-10-CM | POA: Diagnosis not present

## 2024-04-16 DIAGNOSIS — I82531 Chronic embolism and thrombosis of right popliteal vein: Secondary | ICD-10-CM | POA: Insufficient documentation

## 2024-04-16 DIAGNOSIS — M51369 Other intervertebral disc degeneration, lumbar region without mention of lumbar back pain or lower extremity pain: Secondary | ICD-10-CM | POA: Insufficient documentation

## 2024-04-16 DIAGNOSIS — M79676 Pain in unspecified toe(s): Secondary | ICD-10-CM | POA: Diagnosis not present

## 2024-04-16 DIAGNOSIS — I10 Essential (primary) hypertension: Secondary | ICD-10-CM | POA: Insufficient documentation

## 2024-04-16 DIAGNOSIS — E1151 Type 2 diabetes mellitus with diabetic peripheral angiopathy without gangrene: Secondary | ICD-10-CM | POA: Insufficient documentation

## 2024-04-16 DIAGNOSIS — Z794 Long term (current) use of insulin: Secondary | ICD-10-CM | POA: Insufficient documentation

## 2024-04-16 DIAGNOSIS — M170 Bilateral primary osteoarthritis of knee: Secondary | ICD-10-CM | POA: Insufficient documentation

## 2024-04-16 DIAGNOSIS — G5603 Carpal tunnel syndrome, bilateral upper limbs: Secondary | ICD-10-CM | POA: Insufficient documentation

## 2024-04-16 NOTE — Progress Notes (Signed)
 He presents today chief complaint of painful elongated toenails.  States that his A1c is improving.  He has couple of painful calluses as well plantar aspect of the forefoot.  Objective: Vital signs stable alert oriented x 3.  There is no erythema edema salines drainage odor pulses are minimally palpable.  No change in neurologic sensorium nails are long dystrophic sharply incurvated tender.  Assessment: Benign skin lesions interdigitally and plantar aspect of the forefoot.  Pain in limb secondary to onychomycosis.  Diabetic peripheral neuropathy and chronic blood thinner.  Plan: Debrided benign skin lesions debrided mycotic nails.

## 2024-05-19 ENCOUNTER — Other Ambulatory Visit: Payer: Self-pay | Admitting: Cardiology

## 2024-05-28 ENCOUNTER — Encounter: Payer: Self-pay | Admitting: Cardiology

## 2024-05-29 ENCOUNTER — Other Ambulatory Visit: Payer: Self-pay | Admitting: Cardiology

## 2024-06-27 ENCOUNTER — Encounter: Payer: Self-pay | Admitting: Cardiology

## 2024-07-01 MED ORDER — EZETIMIBE 10 MG PO TABS: 10.0000 mg | ORAL_TABLET | Freq: Every day | ORAL | 1 refills | Status: AC

## 2024-07-01 MED ORDER — ROSUVASTATIN CALCIUM 20 MG PO TABS: 20.0000 mg | ORAL_TABLET | Freq: Every day | ORAL | 1 refills | Status: AC

## 2024-07-16 ENCOUNTER — Ambulatory Visit: Admitting: Podiatry
